# Patient Record
Sex: Female | Born: 1970 | Race: White | Marital: Married | Smoking: Former smoker
Health system: Northeastern US, Academic
[De-identification: ages and names within clinical notes are randomized; demographics above are authoritative.]

## PROBLEM LIST (undated history)

## (undated) DIAGNOSIS — F32A Depression, unspecified: Secondary | ICD-10-CM

## (undated) DIAGNOSIS — G35D Multiple sclerosis, unspecified: Secondary | ICD-10-CM

## (undated) DIAGNOSIS — G35 Multiple sclerosis: Secondary | ICD-10-CM

## (undated) HISTORY — PX: APPENDECTOMY: SHX54

---

## 2002-06-07 DIAGNOSIS — F419 Anxiety disorder, unspecified: Secondary | ICD-10-CM

## 2002-06-07 HISTORY — DX: Anxiety disorder, unspecified: F41.9

## 2006-03-15 DIAGNOSIS — G35 Multiple sclerosis: Secondary | ICD-10-CM | POA: Insufficient documentation

## 2009-04-23 ENCOUNTER — Ambulatory Visit: Payer: Self-pay | Admitting: Nurse Practitioner

## 2009-05-21 ENCOUNTER — Encounter: Payer: Self-pay | Admitting: Gastroenterology

## 2009-07-22 ENCOUNTER — Ambulatory Visit: Payer: Self-pay | Admitting: Nurse Practitioner

## 2009-07-23 NOTE — Progress Notes (Signed)
 Reason For Visit   Follow up three month appointment to discuss medical management and current   symptoms.  HPI   Patient has been about the same as last appointment and has ongoing issues   with fatigue and depression along with spasms around her chest but the   Zanaflex seems to be helping with the "MS Hug" that she deals with often at   night. She has not had any new symptoms and is tolerating her injections   with no side effects or injection site reactions.  Allergies   No Known Drug Allergy.  Current Meds   MethylPREDNISolone Sodium Succ 1000 MG Solution Reconstituted;INFUSE 1000mg    IN OF NORMAL SALINE FOR 2 DAYS; Rx  Non-medication order(s);Introcan IV Catheter 22 gage; Rx  Heparin (Porcine) in D5W 100-5 UNIT/ML-% Solution;Heparin 100 unit/ml   PFSuse as directed; Rx  Normal Saline Flush 0.9 % Solution;Normal Saline 3ml PFSUse as directed; Rx  PredniSONE 10 MG Tablet;prednisone taper 10 mg, 6tabs for 4 days, 4 tabs   for 4 days, 3 tabs for 4 days, 2 tabs for 4 days, 1 tab for 4 days,; Rx  Diazepam 5 MG Tablet;TAKE 1 TABLET HALF HOUR BEFORE MRI, TAKE 1 TABLET AT   TIME OF MRI AND 1 TABLET AFTER MRI IF NEEDED.; Rx  Zolpidem Tartrate 5 MG Tablet;TAKE 1 TABLET AT BEDTIME AS NEEDED FOR   SLEEP.; Rx  Diazepam 5 MG Tablet;TAKE 1 TABLET TWICE DAILY.; Rx  PredniSONE 10 MG Tablet;prednisone taper 10 mg, 6tabs for 4 days, 4 tabs   for 4 days, 3 tabs for 4 days, 2 tabs for 4 days, 1 tab for 4 days,; Rx  Citalopram Hydrobromide 40 MG Tablet;TAKE 1 TABLET DAILY.; Rx  Copaxone 20 MG/ML Kit;INJECT SUBCUTANEOUSLY DAILY AS DIRECTED.; Rx  Diazepam 5 MG Tablet;TAKE 1 TABLET 4 TIMES DAILY AS NEEDED.; Rx  TiZANidine HCl 4 MG Tablet;take 1 tablet 3 times a day; Rx  Baclofen 10 MG Tablet;TAKE 1 TABLET 4 TIMES DAILY; Rx  Amantadine HCl 100 MG Capsule;TAKE 1 CAPSULE TWICE DAILY.; Rx  Ciprofloxacin HCl 500 MG Tablet;TAKE 1 TABLET EVERY 12 HOURS FOR 7 DAYS.; Rx  Methylphenidate HCl 10 MG Tablet;take 1 tab twice per day.;  Rx  TiZANidine HCl 2 MG Tablet;; RPT  Vitamin D 400 UNIT Capsule;TAKE 1 CAPSULE DAILY; Rx  Zoloft 100 MG Tablet;TAKE 1 TABLET DAILY.; Rx  Zanaflex 2 MG Capsule;TAKE 1 CAPSULE TWICE DAILY; Rx  Provigil 100 MG Tablet;TAKE 1 TABLET TWICE DAILY.; Rx.  Personal Hx   Relasping remitting multiple sclerosis with ongoing issues but no new   symptoms.  ROS   A  10 point system was reviewed with no changes on his physical examination   from last appointment.  Physical Exam   On physical examination she was alert and oriented times three. There are   choppy occular pursuits with normal facial sensation and normal finger to   nose ataxia. Upper strength is 5 out of 5. Lower strength is 5 out of 5   with dorsi flexion 5 out of 5. There is slight increase in tone in both   legs and decrease in vibratory sensation in both feet. A timed 8 meter walk   took 4:99 seconds.  Assessment   Relapsing remitting mutliple sclerosis with ongoing issues of fatigue,   short term memory issues, and depression. She would like to try Ritalin 10   mg bid for fatigue and her short term memory issues. I made no other  changes to her medications. She has a follow up appointment in six months.   Thank you for allowing [place] to participate in the care of your patient.  Signature   Electronically signed by: Tish Men  N.P.; 07/23/2009 5:20 PM EST;   Chartered loss adjuster.

## 2010-01-19 ENCOUNTER — Ambulatory Visit: Payer: Self-pay | Admitting: Nurse Practitioner

## 2010-01-29 ENCOUNTER — Ambulatory Visit: Payer: Self-pay | Admitting: Nurse Practitioner

## 2010-02-06 NOTE — Progress Notes (Signed)
Reason For Visit   Follow up six month appointment to discuss medical management and current   symptoms.  HPI   Patient has been about the same as last appointment. She has stopped many   of her medications except Copaxone and feeling better. She has not had any   new symptoms and otherwise is about the same as last appointment.  Allergies   No Known Drug Allergy.  Current Meds   Non-medication order(s);Introcan IV Catheter 22 gage; Rx  Normal Saline Flush 0.9 % Solution;Normal Saline 3ml PFSUse as directed; Rx  Copaxone 20 MG/ML Kit;INJECT SUBCUTANEOUSLY DAILY AS DIRECTED.; Rx  Methylphenidate HCl 10 MG Tablet;take 1 tab twice per day.; Rx  Zoloft 100 MG Tablet;TAKE 1 TABLET DAILY.; Rx  Vitamin D 400 UNIT Capsule;TAKE 1 CAPSULE DAILY; Rx  Nitrofurantoin Monohyd Macro 100 MG Capsule;TAKE 1 CAPSULE twice perday for   7 days; Rx  Baclofen 10 MG Tablet;TAKE 1 TABLET 4 TIMES DAILY; Rx  TiZANidine HCl 4 MG Tablet;take 1 tablet 3 times a day; Rx  TiZANidine HCl 2 MG Tablet;; RPT.  Personal Hx   Relapsing remitting multiple sclerosis with ongoing symptoms but no new   issues.  ROS   A 10 point review of systems was done including those positive findings   included in the HPI. Patient reports ongoing symptoms but no new issues.  Physical Exam   On physical examination she was alert and oriented times three. There are   choppy occular pursuits with normal facial movement and normal finger to   nose ataxia. UPper strength is 5 out of 5. Lower strength is 5 out of 5   with dorsi flexion 5 outo f 5. There is increase in tone in both legs and   decrease in vibratory sensation in both feet. A timed 8 meter walk took   4:88 seconds.  Assessment   Relapsing remitting multiple sclerosis with ongoing symptoms but no new   issues. I made no changes to  her medications. She has a follow up   appointment in six months. Thank you for allowing [place] to participate in the   care of your patient.  Signature   Electronically signed by: Tish Men  N.P.; 02/06/2010 5:03 PM EST;   Chartered loss adjuster.

## 2010-06-16 NOTE — Miscellaneous (Unsigned)
 Continuity of Care Record  Created: todo  From: ,   From:   From: [employer], EHR v10.2.7.53  To: Elisha Headland  Purpose: Patient Use;       Problems  Problem: Multiple Sclerosis    Alerts  Allergy - No Known Drug Allergy     Medications  Amantadine HCl 100 MG Capsule; TAKE 1 CAPSULE TWICE DAILY. ; Rx   Baclofen 10 MG Tablet; TAKE 1 TABLET 4 TIMES DAILY ; Rx   Copaxone 20 MG/ML Kit; INJECT SUBCUTANEOUSLY DAILY AS DIRECTED. ; Rx   Methylphenidate HCl 10 MG Tablet; take 1 tab twice per day. ; Rx   Nitrofurantoin Monohyd Macro 100 MG Capsule; TAKE 1 CAPSULE twice perday   for 7 days ; Rx   Non-medication order(s); Introcan IV Catheter 22 gage ; Rx   Normal Saline Flush 0.9 % Solution; Normal Saline 3ml PFSUse as directed ;   Rx   TiZANidine HCl 4 MG Tablet; take 1 tablet 3 times a day ; Rx   Vitamin D 400 UNIT Capsule; TAKE 1 CAPSULE DAILY ; Rx   Zoloft 100 MG Tablet; TAKE 1 TABLET DAILY. ; Rx

## 2010-08-04 ENCOUNTER — Ambulatory Visit: Payer: Self-pay | Admitting: Nurse Practitioner

## 2010-08-04 ENCOUNTER — Encounter: Payer: Self-pay | Admitting: Nurse Practitioner

## 2010-08-04 NOTE — Progress Notes (Signed)
 Reason For Visit   Follow up three month appointment to discuss medical management and current   symptoms.  HPI   Patient has been about the same as last appointment and has ongoing issues   with fatigue and short term memory issues along with weakness in her lower   legs and balance issues. She has not had any new symptoms of late and seems   to be tolerating her Copaxone injections with no side effects or issues   with the medication.  Allergies   No Known Drug Allergy.  Current Meds   Copaxone 20 MG/ML Kit;INJECT SUBCUTANEOUSLY DAILY AS DIRECTED.; Rx  Baclofen 10 MG Tablet;TAKE 1 TABLET 4 TIMES DAILY; Rx  TiZANidine HCl 4 MG Tablet;take 1 tablet 3 times a day; Rx  Amantadine HCl 100 MG Capsule;TAKE 1 CAPSULE TWICE DAILY.; Rx  Zoloft 100 MG Tablet;TAKE 1 TABLET DAILY.; Rx  Nitrofurantoin Monohyd Macro 100 MG Capsule;TAKE 1 CAPSULE twice perday for   7 days; Rx  Vitamin D 400 UNIT Capsule;TAKE 1 CAPSULE DAILY; Rx  Methylphenidate HCl 10 MG Tablet;take 1 tab twice per day.; Rx  Normal Saline Flush 0.9 % Solution;Normal Saline 3ml PFSUse as directed; Rx  Non-medication order(s);Introcan IV Catheter 22 gage; Rx.  Personal Hx   Relapsing remitting multiple sclerosis with ongoing symptoms but no new   issues with her multiple sclerosis symptoms.  ROS   A  10 point review of systems was done including those positive findings   included in the HPI. Patient reports ongoing symptoms but no new issues.  Physical Exam   On physical examination she was alert and oriented times three. There are   choppy occular pursuits with normal facial movement and normal finger to   nose ataxia. Upper strength is 5 out of 5. Lower strenght is 5 out of 5   with dorsi flexion 5 out of 5. There is increase in tone in both legs and   decrease in vibratory sensation in both feet. A timed  8 meter walk took   4;99 seconds.  Assessment   Relapsing remitting multiple sclerosis with ongoing symptoms but no new   issues. I made no changes to her  medications. She should be considered   permanently disabled. She has an appointment in three months. Thank you for   allowing [place] to participate in the care of your patient.  Signature   Electronically signed by: Tish Men  N.P.; 08/04/2010 6:40 PM EST;   Chartered loss adjuster.

## 2010-12-10 ENCOUNTER — Other Ambulatory Visit: Payer: Self-pay | Admitting: Nurse Practitioner

## 2010-12-10 MED ORDER — SERTRALINE HCL 100 MG PO TABS *I*
100.0000 mg | ORAL_TABLET | Freq: Every day | ORAL | Status: DC
Start: 2010-12-10 — End: 2013-04-23

## 2011-02-02 ENCOUNTER — Ambulatory Visit: Payer: Self-pay | Admitting: Nurse Practitioner

## 2011-02-02 ENCOUNTER — Encounter: Payer: Self-pay | Admitting: Nurse Practitioner

## 2011-02-02 DIAGNOSIS — G35 Multiple sclerosis: Secondary | ICD-10-CM

## 2011-02-02 MED ORDER — BACLOFEN 10 MG PO TABS *I*
ORAL_TABLET | ORAL | Status: DC
Start: 2011-02-02 — End: 2013-04-23

## 2011-02-02 NOTE — Progress Notes (Signed)
PATIENT: Jodi Butler, Jodi Butler  MRN: [id number]   DOB: Mar 08, 1971   DATE OF SERVICE: 02/02/2011          REASON FOR VISIT  Follow up six month appointment to discuss medical management and current symptoms.  HPI  Patient has been about the same as last appointment and has not had any new symptoms or issues. She continues to have fatigue and spasticity but as long as she paces herself throughout the day she can cope. She is tolerating her Copaxone injections with no side effects or issues with the medication.  ALLERGIES  Allergies   Allergen Reactions   . No Known Drug Allergy      Created by Conversion - 0;         CURRENT MEDS:   Current Outpatient Prescriptions   Medication Sig   . baclofen (LIORESAL) 10 MG tablet Take one tablet four times per day   . sertraline (ZOLOFT) 100 MG tablet Take 1 tablet (100 mg total) by mouth daily     . amantadine (SYMMETREL) 100 MG capsule TAKE 1 CAPSULE TWICE DAILY.   . nitrofurantoin, macrocrystal-monohydrate, (MACROBID) 100 MG capsule TAKE 1 CAPSULE twice perday for 7 days   . methylphenidate (RITALIN) 10 MG tablet take 1 tab twice per day.   Marland Kitchen tiZANidine (ZANAFLEX) 4 MG tablet take 1 tablet 3 times a day   . ZOLOFT 100 MG tablet TAKE 1 TABLET DAILY.   Marland Kitchen Cholecalciferol (VITAMIN D) 400 UNITS capsule TAKE 1 CAPSULE DAILY   . sodium chloride 0.9% flush (NORMAL SALINE FLUSH) 0.9 % injection Normal Saline 3ml PFSUse as directed   . glatiramer (COPAXONE) 20 MG/ML injection INJECT SUBCUTANEOUSLY DAILY AS DIRECTED.        PERSONAL Hx:  Relapsing remitting multiple sclerosis with ongoing symptoms with fatigue and spasticity  but no new issues of late.    ROS:  A 10 point review of systems was done including those positive findings included in the HPI. Patient reports ongoing symptoms but no new issues.    VITAL SIGNS:  Wt Readings from Last 3 Encounters:   01/29/10 82.6 kg (182 lb 1.6 oz)     Temp Readings from Last 3 Encounters:   06/21/08 35.5 C (95.9 F)    06/20/08 36.1 C (96.98 F)     06/19/08 35.6 C (96.08 F)      BP Readings from Last 3 Encounters:   01/29/10 120/71   06/21/08 123/77   06/20/08 130/65     Pulse Readings from Last 3 Encounters:   01/29/10 61   06/21/08 81   06/20/08 96      There were no vitals filed for this visit.     PHYSICAL EXAM:  On physical examination she was alert and oriented times three There are choppy occular pursuits with mild facial weakness and normal finger to nose ataxia. Upper strength is 5 out of 5. Lower strength is 4 out of 5 wiith dorsi flexion 4 out of 5. There is increase in tone in both legs and decrease in vibratory sensation in both feet. A itmed 8 meter walk took 5:02 seconds with her gait slightly ataxic and wide based.    ASSESSMENT:  Relapsing remittting multiple sclerosis with ongoing symptoms but no new issues. I made no changes to her medications. She has a follow up appointment in six months. Thank you for allowing [place] to participate in the care of your patient.

## 2011-05-20 ENCOUNTER — Other Ambulatory Visit: Payer: Self-pay | Admitting: Nurse Practitioner

## 2011-05-20 MED ORDER — BUPROPION HCL 150 MG PO TB12 *I*
150.0000 mg | ORAL_TABLET | Freq: Two times a day (BID) | ORAL | Status: DC
Start: 2011-05-20 — End: 2013-04-23

## 2011-05-20 MED ORDER — BUPROPION HCL 150 MG PO TB24 *I*
150.0000 mg | ORAL_TABLET | Freq: Every morning | ORAL | Status: DC
Start: 2011-05-20 — End: 2013-04-23

## 2013-04-22 ENCOUNTER — Encounter: Payer: Self-pay | Admitting: Primary Care

## 2013-04-23 ENCOUNTER — Encounter: Payer: Self-pay | Admitting: Primary Care

## 2013-04-23 ENCOUNTER — Ambulatory Visit: Payer: Self-pay | Admitting: Primary Care

## 2013-04-23 VITALS — BP 110/78 | HR 84 | Temp 99.1°F | Ht 60.5 in | Wt 170.4 lb

## 2013-04-23 DIAGNOSIS — Z0000 Encounter for general adult medical examination without abnormal findings: Secondary | ICD-10-CM

## 2013-04-23 DIAGNOSIS — H6123 Impacted cerumen, bilateral: Secondary | ICD-10-CM

## 2013-04-23 DIAGNOSIS — F32A Depression, unspecified: Secondary | ICD-10-CM

## 2013-04-23 DIAGNOSIS — J069 Acute upper respiratory infection, unspecified: Secondary | ICD-10-CM

## 2013-04-23 DIAGNOSIS — G35 Multiple sclerosis: Secondary | ICD-10-CM | POA: Insufficient documentation

## 2013-04-23 MED ORDER — BUPROPION HCL 150 MG PO TB12 *I*
150.0000 mg | ORAL_TABLET | Freq: Two times a day (BID) | ORAL | Status: DC
Start: 2013-04-23 — End: 2013-09-21

## 2013-04-23 NOTE — Progress Notes (Signed)
[facility] - Outpatient Progress Note    SUBJECTIVE    Jodi Butler is a 42 y.o. female here to establish care. she is transferring from [place]. she has the following concerns today, including:    1. L ear pain - Pain has been coming and going for months, usually only minutes in duration, accompanied by occasional drainage. Denies difficulty hearing, fevers/chills, jaw pain. Previous h/o cerumen impaction requiring debrox treatment. Uses qtips.     2. Viral sx - About 1 week ago developed rhinorrhea, congestion, ST. Now feels like it's progressed into her lungs. Has productive cough with green sputum. Denies acute SOB or chest tightness. Denies focal sinus pain or purulent rhinorrhea. Denies fevers.     3. H/o multiple sclerosis - Diagnosed in 2004 with initial sx of double vision, then fatigue and muscle spasticity. Previous pt at [facility] (Dr. Derrill Kay), currently on Copaxone injections. Has flare of sx about every 1-2 years, last time was ~1 year ago. Has not seen Neuro since coming back to [place]. States she's at her baseline. Completely disabled as a result, but independent in her ADLs and iADLs. Drives and walks without ambulatory device at baseline, but uses wheelchair with flares.     4. H/o depression - Stable with Wellbutrin for the past 2 years. Needs refill. Currently having intermittent anxiety due to health problems of her wife, but otherwise mood is "ok". Has never participated in counseling before.       Review of Systems   Constitutional:  --negative for fever and chills   Eyes:  -- negative for blurred or double vision   ENMT:   --positive for acute rhinorrhea and congestion  --negative for hearing loss or tinnitus, positive for L ear pain  --negative for mouth lesions/sores or tooth pain  --positive for ST   Respiratory:   --positive for current productive cough, denies chronicity  --positive for exertional dyspnea, noticed this during the move  --negative for  wheezing  Cardiovascular:  --negative for chest pain, palpitations, syncope  --negative for pedal edema  Gastrointestinal:  --negative for abdominal pain, n/v/c/d  Genitourinary:  --positive for heavy menses and cramping with periods, regular in timing. Needs pap smear  Heme/Lymph:  --negative for swollen lymph nodes   --positive for easy bruising and bleeding  Skin/Breast:  --negative for rashes or concerning mole  --negative for breast concerns, has never had mammogram yet  Psych:  --negative for current depressed mood  --positive for intermittent worry,   --negative for substance abuse, SI  Endo:  --positive for nocturia  --negative for polydispsia  --negative for heat/cold intolerance   Neuro:  --Denies recent falls  --positive for intermittent numbness in b/l hands, denies specific radicular distribution  MSK:  --Intermittent b/l LE discomfort, otherwise no myalgias or arthralgias    Patient's medications, allergies, past medical, surgical, social and/or family histories were reviewed with the patient and updated/populated in eRecord.     Current Outpatient Prescriptions   Medication Sig   . Vitamin Mixture (ESTER-C PO) Take 500 mg by mouth daily   . cetirizine (ZYRTEC) 10 MG tablet Take 10 mg by mouth daily   . ibuprofen (ADVIL,MOTRIN) 200 MG tablet Take 400 mg by mouth 2 times daily as needed for Pain   . glatiramer (COPAXONE) 20 MG/ML injection Inject 20 mg into the skin   . tiZANidine (ZANAFLEX) 4 MG tablet Take 4 mg by mouth every 8 hours as needed for Muscle spasms   . baclofen (LIORESAL) 10  MG tablet 10 mg nightly as needed   . buPROPion (WELLBUTRIN SR) 150 MG 12 hr tablet Take 1 tablet (150 mg total) by mouth 2 times daily   Swallow whole. Do not crush, break, or chew.     Health maintenance:   Last pap smear - 2000?  Has never had mammogram  Thinks she's UTD on Tdap, has not rec'd flu this year or PCV      OBJECTIVE    Filed Vitals:    04/23/13 1026   BP: 110/78   Pulse: 84   Temp: 37.3 C (99.1 F)    TempSrc: Temporal   Height: 1.537 m (5' 0.5")   Weight: 77.293 kg (170 lb 6.4 oz)      Body mass index is 32.72 kg/(m^2).      General: well-appearing Caucasian female, pleasant & conversant, in NAD  Psych:  normal mood and affect; answers questions appropriately; AAOx3 although her wife occasionally provides family history which pt then remembers and tells me that she forgot due to her MS  Eyes:  Normal lids and conjuctivae, EOMI, PERRLA. No nystagmus, INO or lack of consensual light reflex.  HEENT: B/L TMs obscured by soft cerumen (see proc note below). No focal sinus tenderness on palpation. Nasal turbinates normal appearing, without obstruction or purulent mucus collection. MMM without OP lesions.   Neck: supple, no thyromegaly, no palpable thyroid nodules  Lymph: no cervical or supraclavicular LAD  Lungs: normal respiratory effort, lungs are clear to auscultation bilaterally with no rales, wheezes, or rhonchi  CV: RRR, no murmur, rub or gallop, no pedal edema  Skin: Warm, dry, normal turgor; no rashes visible.   Ext: Trace pedal edema bilaterally.   Neuro: CNs grossly intact. Motor and sensory grossly intact. DTRs 2+ bilaterally. No increased tone. Normal gait.    PRE-PROCEDURE EXAM: TM cannot be visualized due to total occlusion/impaction of the ear canal.  PROCEDURE INDICATION: remove wax to visualize ear drum &  relieve discomfort  CONSENT: Verbal    PROCEDURE NOTE:   Left EAR: I used a plastic illuminated curette under direct vision with an otoscope and attempted to free the wax bolus from the ear wall, then successfully removed the majority of wax. TM was successfully visualized and without abnormality  Right Ear: I used a plastic illuminated curette under direct vision with an otoscope and attempted to free the wax bolus from the ear wall, but was only able to remove a small amount of wax. The external canal suffered a local abrasion with minimal amount of bleeding and subsequent discomfort to the  patient. The ear was then irrigated per pt's request with minimal yield. A curette was again used in attempt to remove the remaining wax but was again uncomfortable for the patient. The right TM was ultimately not visualized.      POST- PROCEDURE EXAM: LTM successfully visualized and found to be intact with no redness or perforation. 98% of ear wax had been removed from the ear canal. R TM remained obscured by impacted cerumen and external canal developed abrasion with minimal amount of bleeding    POST-PROCEDURE INSTRUCTIONS: avoid use of Q-tips        ASSESSMENT & PLAN  1. Cerumen impaction, bilateral  L cerumen successfully removed. R cerumen remained impacted. Encouraged to avoid q tip use and f/u prn if R ear becomes uncomfortable/painful.    2. Viral URI  Encouraged supportive care with fluids, APAP/NSAIDs and honey for cough. Reviewed concerns for pneumonia and  sinusitis and recommended f/u prn development of these sx.    3. Multiple sclerosis  Encouraged to establish care with neurology. Vaccine provided as below.    - Pneumococcal polysaccharide vaccine 23-valent greater than or equal to 2yo subcutaneous/IM (AMBULATORY USE ONLY)    4. Depression  Refilled Wellbutrin. Provided supportive listening about recent stressors that pt and her wife have undergone. Encouraged f/u if worsening of sx.     5. Healthcare maintenance  - CBC; Future  - Comprehensive metabolic panel; Future  - LIPID PANEL; Future  - Flu vaccine quadrivalent greater than or equal to 3yo with preservative IM  Pt denies current fever (but has current viral UR sx), recent vaccine in last 4 weeks, allergy to egg or flu vaccine component, h/o Guillain-Barre or current pregnancy. I have educated the patient and/or parent/guardian/designee about each component in all the immunizations and toxoids that are being given today, have reviewed potential vaccine side effects and have answered their questions about the vaccinations.     Encouraged f/u for  AGY given lack of Pap smear in ~10 yrs, and need to discuss breast cancer screening given +fam hx.      Follow up next avail for AGY, return to clinic sooner prn.        Farrell Ours, MD  [facility]  04/23/2013   10:33 AM

## 2013-05-07 ENCOUNTER — Ambulatory Visit
Admit: 2013-05-07 | Discharge: 2013-05-07 | Disposition: A | Discharge status: Home / Self Care | Payer: Self-pay | Source: Ambulatory Visit | Attending: Primary Care | Admitting: Primary Care

## 2013-05-07 DIAGNOSIS — Z0000 Encounter for general adult medical examination without abnormal findings: Secondary | ICD-10-CM

## 2013-05-07 LAB — LIPID PANEL
Chol/HDL Ratio: 2.5
Cholesterol: 177 mg/dL
HDL: 70 mg/dL
LDL Calculated: 98 mg/dL
Non HDL Cholesterol: 107 mg/dL
Triglycerides: 47 mg/dL

## 2013-05-07 LAB — COMPREHENSIVE METABOLIC PANEL
ALT: 18 U/L (ref 0–35)
AST: 20 U/L (ref 0–35)
Albumin: 4.3 g/dL (ref 3.5–5.2)
Alk Phos: 57 U/L (ref 35–105)
Anion Gap: 12 (ref 7–16)
Bilirubin,Total: 0.4 mg/dL (ref 0.0–1.2)
CO2: 25 mmol/L (ref 20–28)
Calcium: 8.9 mg/dL (ref 8.8–10.2)
Chloride: 104 mmol/L (ref 96–108)
Creatinine: 0.72 mg/dL (ref 0.51–0.95)
GFR,Black: 120 *
GFR,Caucasian: 104 *
Glucose: 85 mg/dL (ref 60–99)
Lab: 10 mg/dL (ref 6–20)
Potassium: 4.2 mmol/L (ref 3.3–5.1)
Sodium: 141 mmol/L (ref 133–145)
Total Protein: 6.5 g/dL (ref 6.3–7.7)

## 2013-05-07 LAB — CBC
Hematocrit: 39 % (ref 34–45)
Hemoglobin: 12.9 g/dL (ref 11.2–15.7)
MCV: 96 fL — ABNORMAL HIGH (ref 79–95)
Platelets: 221 10*3/uL (ref 160–370)
RBC: 4.1 MIL/uL (ref 3.9–5.2)
RDW: 12.8 % (ref 11.7–14.4)
WBC: 3.7 10*3/uL — ABNORMAL LOW (ref 4.0–10.0)

## 2013-05-14 ENCOUNTER — Ambulatory Visit: Payer: Self-pay | Admitting: Primary Care

## 2013-05-14 NOTE — Telephone Encounter (Signed)
Unable to reach patient by telephone.  Call could not be completed per carrier.  My Chart message sent advising patient of cancellation.

## 2013-05-17 ENCOUNTER — Other Ambulatory Visit: Payer: Self-pay | Admitting: Nurse Practitioner

## 2013-05-17 MED ORDER — BACLOFEN 10 MG PO TABS *I*
10.0000 mg | ORAL_TABLET | Freq: Every evening | ORAL | Status: DC | PRN
Start: 2013-05-17 — End: 2013-07-11

## 2013-05-17 MED ORDER — TIZANIDINE HCL 4 MG PO TABS *I*
4.0000 mg | ORAL_TABLET | Freq: Three times a day (TID) | ORAL | Status: DC | PRN
Start: 2013-05-17 — End: 2013-06-18

## 2013-05-18 NOTE — Telephone Encounter (Signed)
I attempted to call both numbers on file, home/cell is not working and her work number said they do not recall anyone by that name

## 2013-06-12 ENCOUNTER — Telehealth: Payer: Self-pay

## 2013-06-12 NOTE — Telephone Encounter (Signed)
Patient's appointment with Dr. Christell Constant was bumped on 05/14/13 and patient never called to reschedule.  Left message with "[name]" to have patient call the office.  Please reschedule AGYN when she calls back.

## 2013-06-13 NOTE — Telephone Encounter (Signed)
LVM (see below)

## 2013-06-14 NOTE — Telephone Encounter (Signed)
Unable to LVM.

## 2013-06-14 NOTE — Telephone Encounter (Signed)
Patient is scheduled for 07/10/13. She wanted an appt later in the month

## 2013-06-18 ENCOUNTER — Ambulatory Visit: Payer: Self-pay | Admitting: Nurse Practitioner

## 2013-06-18 DIAGNOSIS — G35 Multiple sclerosis: Secondary | ICD-10-CM

## 2013-06-18 MED ORDER — TIZANIDINE HCL 4 MG PO TABS *I*
4.0000 mg | ORAL_TABLET | Freq: Three times a day (TID) | ORAL | Status: DC | PRN
Start: 2013-06-18 — End: 2016-06-02

## 2013-06-18 MED ORDER — ALPRAZOLAM 0.25 MG PO TABS *I*
ORAL_TABLET | ORAL | Status: DC
Start: 2013-06-18 — End: 2014-11-12

## 2013-06-18 MED ORDER — CIPROFLOXACIN HCL 500 MG PO TABS *I*
ORAL_TABLET | ORAL | Status: DC
Start: 2013-06-18 — End: 2014-05-21

## 2013-06-20 NOTE — Progress Notes (Signed)
PATIENT NAME:  Jodi Butler   MRN: [id number]   DOB: Feb 11, 1971   DATE OF SERVICE: 06/18/2013          REASON FOR VISIT  Follow up three month appointment to discuss medical management and current symptoms.  HPI  Patient has been about the same as last appointment and has ongoing issues with depression and anxiety. She feels that some of her current issues are related to moving back to cold weather from [place] and not seeing sun every day. She feels more cooped in during the day. She is trying to exercise as much as possible and that helps some of her depression. She has not had any new symptoms and feels otherwise about the same. She finds that Xanax has been very helpful in keeping her anxiety in check and tries to limit how much she takes during the day as she afraid if she takes too much she will develop a tolerance to it.   ALLERGIES  Allergies   Allergen Reactions    No Known Drug Allergy      Created by Conversion - 0;         CURRENT MEDS:   Current Outpatient Prescriptions   Medication Sig    ciprofloxacin (CIPRO) 500 MG tablet Take one tablet twice per day for 7 days.    tiZANidine (ZANAFLEX) 4 MG tablet Take 1 tablet (4 mg total) by mouth every 8 hours as needed for Muscle spasms    ALPRAZolam (XANAX) 0.25 MG tablet Take one tablet twice per day    baclofen (LIORESAL) 10 MG tablet Take 1 tablet (10 mg total) by mouth nightly as needed    Vitamin Mixture (ESTER-C PO) Take 500 mg by mouth daily    cetirizine (ZYRTEC) 10 MG tablet Take 10 mg by mouth daily    ibuprofen (ADVIL,MOTRIN) 200 MG tablet Take 400 mg by mouth 2 times daily as needed for Pain    glatiramer (COPAXONE) 20 MG/ML injection Inject 20 mg into the skin    buPROPion (WELLBUTRIN SR) 150 MG 12 hr tablet Take 1 tablet (150 mg total) by mouth 2 times daily   Swallow whole. Do not crush, break, or chew.        PERSONAL Hx:  Relapsing remitting multiple sclerosis with ongoing symptoms of depression and anxiety but no new symptoms of late.  She is tolerating her medications with no side effects or issues.     ROS:  A 10 point review of systems was done including those positive findings included in the HPI. Patient reports the above mentioned symptoms     VITAL SIGNS:  Wt Readings from Last 3 Encounters:   04/23/13 77.293 kg (170 lb 6.4 oz)   01/29/10 82.6 kg (182 lb 1.6 oz)     Temp Readings from Last 3 Encounters:   04/23/13 37.3 C (99.1 F) Temporal   06/21/08 35.5 C (95.9 F)    06/20/08 36.1 C (96.98 F)      BP Readings from Last 3 Encounters:   04/23/13 110/78   01/29/10 120/71   06/21/08 123/77     Pulse Readings from Last 3 Encounters:   04/23/13 84   01/29/10 61   06/21/08 81      There were no vitals filed for this visit.     PHYSICAL EXAM:  On physical examination she was alert and oriented times three. There a choppy occular pursuits with normal facial sensation and no nose to finger ataxia. Upper strength  is (R/L) 5/5.Lower strength is (R/L) 5/5 with dorsi flexion 5/5 There is normal tone in both legs and decrease in vibratory sensation in both feet. A timed 8 meter walk took 4:88 seconds.     ASSESSMENT:  Relapsing remitting multiple sclerosis with ongoing symptoms of anxiety and depression but no new symptoms of late. She feels she is trying to cope as best as she can and does not want any changes to her medications or a referral to see a counselor.  I made no other changes to her medications. She has a follow up appointment in six months. Thank you for allowing us to participate in the care of your patient.

## 2013-07-10 ENCOUNTER — Ambulatory Visit: Payer: Self-pay | Admitting: Primary Care

## 2013-07-11 ENCOUNTER — Other Ambulatory Visit: Payer: Self-pay | Admitting: Nurse Practitioner

## 2013-08-06 LAB — HM HIV SCREENING OFFERED

## 2013-08-06 LAB — HM PAP SMEAR

## 2013-08-09 ENCOUNTER — Encounter: Payer: Self-pay | Admitting: Primary Care

## 2013-08-09 ENCOUNTER — Ambulatory Visit: Payer: Self-pay | Admitting: Primary Care

## 2013-08-09 VITALS — BP 120/80 | HR 98 | Temp 98.2°F | Ht 61.0 in | Wt 166.0 lb

## 2013-08-09 DIAGNOSIS — Z0000 Encounter for general adult medical examination without abnormal findings: Secondary | ICD-10-CM

## 2013-08-09 MED ORDER — NYSTOP 100000 UNIT/GM EX POWD *I*
Freq: Two times a day (BID) | CUTANEOUS | Status: DC | PRN
Start: 2013-08-09 — End: 2018-10-10

## 2013-08-09 NOTE — Patient Instructions (Signed)
Mammo  [facility]  [phone]

## 2013-08-09 NOTE — H&P (Signed)
[facility]: Annual Gyn Exam    HPI  Jodi Butler is a 43 y.o. premenopausal female who presents for an annual gyn  exam.      OB/Gyn History  LMP: Patient's last menstrual period was 08/02/2013 (approximate).   Menses: regular, heavy bleeding the first 2 days, 4-5 days total   Cramps: moderate in first few days    Last pap smear: Date: ?15 yrs ago  Results: normal    The patient is  sexually active.   Sexually active:female  Together with partner: years  STD History: denies  Current contraception: Female partner      Obstetric History     No data available   G0P0    Patient Active Problem List   Diagnosis Code    Multiple sclerosis 340    Depression 311      No past medical history on file.   Past Surgical History   Procedure Laterality Date    Appendectomy       Current Outpatient Prescriptions   Medication    baclofen (LIORESAL) 10 MG tablet    ciprofloxacin (CIPRO) 500 MG tablet    tiZANidine (ZANAFLEX) 4 MG tablet    ALPRAZolam (XANAX) 0.25 MG tablet    Vitamin Mixture (ESTER-C PO)    cetirizine (ZYRTEC) 10 MG tablet    ibuprofen (ADVIL,MOTRIN) 200 MG tablet    glatiramer (COPAXONE) 20 MG/ML injection    buPROPion (WELLBUTRIN SR) 150 MG 12 hr tablet     No current facility-administered medications for this visit.     Allergies   Allergen Reactions    No Known Drug Allergy      Created by Conversion - 0;      Family History   Problem Relation Age of Onset    Multiple Sclerosis Father     Stroke Maternal Grandmother     Alzheimer's disease Maternal Grandmother     Hypertension Mother     Breast cancer Paternal Aunt 1    Breast cancer Paternal Grandmother     Lung cancer Paternal Uncle      History     Social History    Marital Status: Married     Spouse Name: N/A     Number of Children: N/A    Years of Education: N/A     Occupational History    Not on file.     Social History Main Topics    Smoking status: Former Smoker -- 1.00 packs/day for 5 years     Types: Cigarettes      Quit date: 06/07/1996    Smokeless tobacco: Not on file    Alcohol Use: Yes    Drug Use: No    Sexual Activity:     Partners: Female     Other Topics Concern    Not on file     Social History Narrative    Lives with wife Jodi Butler and 2 dogs. Permanently disabled for multiple sclerosis.      Family History   Problem Relation Age of Onset    Multiple Sclerosis Father     Stroke Maternal Grandmother     Alzheimer's disease Maternal Grandmother     Hypertension Mother     Breast cancer Paternal Aunt 8    Breast cancer Paternal Grandmother     Lung cancer Paternal Uncle    Denies osteoporosis      Safety  Wears seatbelt? yes  Wears cycling helmet? yes  Smoke detectors?yes  Carbon monoxide  detectors?yes  Adequate housing? yes  Feel safe at home? yes      Health Maintenance  Looking into insurance coverage for Tdap and PCV  Has not had mammo yet    ROS  CONSTITUTIONAL: Appetite good, no fevers, night sweats or weight loss   CV: No chest pain, palpitations, shortness of breath or peripheral edema   RESPIRATORY: No cough, wheezing or dyspnea   BREAST: No nipple discharge or breast tenderness  GU: No dysuria, urgency or incontinence  GYN: No vaginal discharge, odor, +dryness and itching and discomfort. No rash.   NEURO: MS typically presents with double vision and weakness of legs, no use of wheelchair or assistive device.        PHYSICAL EXAM  BP 120/80    Pulse 98    Temp(Src) 36.8 C (98.2 F) (Temporal)    Ht 1.549 m (5\' 1" )    Wt 75.297 kg (166 lb)    BMI 31.38 kg/m2      LMP 08/02/2013     Body mass index is 31.38 kg/(m^2).   GENERAL APPEARANCE: Appears stated age, well appearing, NAD   GU: normal external female genitalia with mild erythema of inguinal folds and perineum, somewhat papular on gluteus. No purple changes however.  Vaginal discharge was not present.  Normal nulliparous cervix, mild bleeding after specimen collected. No masses in vaginal vault.  No cervical motion tenderness, no adnexal masses.      ASSESSMENT  Jodi Butler is a 43 y.o. female here for annual gyn exam.        1. Annual physical exam  GYN Cytology    Mammography screening bilateral    GYN Cytology        PLAN    Immunizations:   Immunization History   Administered Date(s) Administered    Influenza Injectable Quadrivalent with preservati 04/23/2013   Recommended PCV and Tdap; pt going to look into her insurance    Weight management: Body mass index is 31.38 kg/(m^2).Marland Kitchen.    Discussed the patient's BMI with her. The BMI is above average; BMI management plan is completed.      Anticipatory/preventative guidance  Tobacco use and cessation counseling if indicated: Y  Alcohol use: Y  Drug use: Y  OBGYN/Pelvic (females): Y  Mammogram/breast self exam (females):  Y  DEXA (females):  Y, no chronic steroids or decreased ambulation warranting early DXA  Exercise:  Y  Psychologist, sport and exercisemoke detectors, Government social research officerfire extinguisher, CO detector:  Y  Seatbelt use: Y  Marital/relationship history: Y  Diet/body weight discussed: Y   Depression screening evaluation performed today: Y  Cardiac risk factor modification discussed Y  Diabetes prevention and screening discussed Y        Follow-up: annually or prn    Signed:  Farrell OursJillian Maddison Kilner, MD  [facility]

## 2013-08-14 LAB — HPV DNA PROBE WITH CYTOLOGY
HPV Other High Risk: NEGATIVE
HPV Type 16: NEGATIVE
HPV Type 18: NEGATIVE

## 2013-08-14 LAB — GYN CYTOLOGY

## 2013-08-21 ENCOUNTER — Telehealth: Payer: Self-pay

## 2013-08-21 NOTE — Telephone Encounter (Signed)
LVM asking patient to call us back, we need to find out if she has scheduled her Mammogram yet if not it needs to get scheduled

## 2013-08-22 NOTE — Telephone Encounter (Signed)
LVM (see below)

## 2013-08-23 NOTE — Telephone Encounter (Signed)
Spoke with patient and she has had a family member in the hospital once this is scheduled she will call us and let us know

## 2013-09-03 ENCOUNTER — Telehealth: Payer: Self-pay

## 2013-09-03 ENCOUNTER — Other Ambulatory Visit: Payer: Self-pay | Admitting: Primary Care

## 2013-09-03 DIAGNOSIS — Z1231 Encounter for screening mammogram for malignant neoplasm of breast: Secondary | ICD-10-CM

## 2013-09-03 NOTE — Telephone Encounter (Signed)
LVM    Please see if patient scheduled her Mammogram yet and if not please offer to schedule it for her

## 2013-09-03 NOTE — Telephone Encounter (Signed)
When patient calls back please let her know her Mammogram has been scheduled for May 28th at 11:45 at [facility]

## 2013-09-05 NOTE — Telephone Encounter (Signed)
LVM giving patient the information about her mamo

## 2013-09-21 ENCOUNTER — Ambulatory Visit: Payer: Self-pay | Admitting: Nurse Practitioner

## 2013-09-21 DIAGNOSIS — G35 Multiple sclerosis: Secondary | ICD-10-CM

## 2013-09-21 MED ORDER — BUPROPION HCL 300 MG PO TB24 *I*
300.0000 mg | ORAL_TABLET | Freq: Every morning | ORAL | Status: DC
Start: 2013-09-21 — End: 2014-04-08

## 2013-09-21 MED ORDER — BACLOFEN 10 MG PO TABS *I*
10.0000 mg | ORAL_TABLET | Freq: Two times a day (BID) | ORAL | Status: DC
Start: 2013-09-21 — End: 2013-11-05

## 2013-09-23 NOTE — Progress Notes (Signed)
PATIENT NAME:  Jodi Butler   MRN: [id number]   DOB: 11-06-1970   DATE OF SERVICE: 09/21/2013          REASON FOR VISIT  Follow up six month appointment to discuss medical management and current symptoms.   HPI  Patient states that she has been about the same as last appointment and has ongoing issues with anxiety and depression but no new symptoms of late. She is tolerating her medications with no side effects or issues.   ALLERGIES  Allergies   Allergen Reactions    No Known Drug Allergy      Created by Conversion - 0;         CURRENT MEDS:   Current Outpatient Prescriptions   Medication Sig    buPROPion (WELLBUTRIN XL) 300 MG 24 hr tablet Take 1 tablet (300 mg total) by mouth every morning   Swallow whole. Do not crush, break, or chew.    baclofen (LIORESAL) 10 MG tablet Take 1 tablet (10 mg total) by mouth 2 times daily    nystatin (NYSTOP) 100000 UNIT/GM powder Apply topically 2 times daily as needed for Itching    ciprofloxacin (CIPRO) 500 MG tablet Take one tablet twice per day for 7 days.    tiZANidine (ZANAFLEX) 4 MG tablet Take 1 tablet (4 mg total) by mouth every 8 hours as needed for Muscle spasms    ALPRAZolam (XANAX) 0.25 MG tablet Take one tablet twice per day    Vitamin Mixture (ESTER-C PO) Take 500 mg by mouth daily    cetirizine (ZYRTEC) 10 MG tablet Take 10 mg by mouth daily    ibuprofen (ADVIL,MOTRIN) 200 MG tablet Take 400 mg by mouth 2 times daily as needed for Pain    glatiramer (COPAXONE) 20 MG/ML injection Inject 20 mg into the skin        PERSONAL Hx:  Relapsing remitting multiple sclerosis with ongoing issues of anxiety and depression but no new symptoms of late. She is tolerating her current symptoms.     ROS:  A 10 point review of systems was done including those positive findings included in the HPI. Patient reports the above mentioned symptoms.     VITAL SIGNS:  Wt Readings from Last 3 Encounters:   08/09/13 75.297 kg (166 lb)   04/23/13 77.293 kg (170 lb 6.4 oz)   01/29/10 82.6  kg (182 lb 1.6 oz)     Temp Readings from Last 3 Encounters:   08/09/13 36.8 C (98.2 F) Temporal   04/23/13 37.3 C (99.1 F) Temporal   06/21/08 35.5 C (95.9 F)      BP Readings from Last 3 Encounters:   08/09/13 120/80   04/23/13 110/78   01/29/10 120/71     Pulse Readings from Last 3 Encounters:   08/09/13 98   04/23/13 84   01/29/10 61      There were no vitals filed for this visit.     PHYSICAL EXAM:  On physical examination she was alert and oriented times three. There are choppy occular pursuits with normal facial sensation and no finger to nose ataxia. Upper strength is (R/L) 5/5. Lower strength is (R/L) 5/5 with dorsi flexion 5/5 There is normal tone in both legs and decrease in vibratory sensation in both legs A timed 8 meter walk took 4.99 seconds.     ASSESSMENT:  Relapsing remitting multiple sclerosis with ongoing symptoms of depression and anxiety but no new symptoms of late. I made no changes  to her medications. She should be considered permanently disabled. She has a follow up appointment in six months. Thank you for allowing us to participate in the care of your patient.

## 2013-10-04 ENCOUNTER — Telehealth: Payer: Self-pay | Admitting: Primary Care

## 2013-10-04 DIAGNOSIS — G35 Multiple sclerosis: Secondary | ICD-10-CM

## 2013-10-04 NOTE — Telephone Encounter (Signed)
Pt needs new neuro referral. Will be seeing Dr Marshell Garfinkel, [facility].

## 2013-11-01 ENCOUNTER — Ambulatory Visit
Admit: 2013-11-01 | Discharge: 2013-11-01 | Disposition: A | Discharge status: Home / Self Care | Payer: Self-pay | Source: Ambulatory Visit | Attending: Primary Care | Admitting: Primary Care

## 2013-11-01 LAB — HM MAMMOGRAPHY

## 2013-11-03 ENCOUNTER — Other Ambulatory Visit: Payer: Self-pay | Admitting: Nurse Practitioner

## 2013-11-05 ENCOUNTER — Other Ambulatory Visit: Payer: Self-pay | Admitting: Neurology

## 2013-11-05 MED ORDER — BACLOFEN 10 MG PO TABS *I*
10.0000 mg | ORAL_TABLET | Freq: Two times a day (BID) | ORAL | Status: DC
Start: 2013-11-05 — End: 2014-05-21

## 2013-11-07 ENCOUNTER — Other Ambulatory Visit: Payer: Self-pay | Admitting: Radiology

## 2013-11-07 DIAGNOSIS — R928 Other abnormal and inconclusive findings on diagnostic imaging of breast: Secondary | ICD-10-CM

## 2013-11-16 ENCOUNTER — Ambulatory Visit
Admit: 2013-11-16 | Discharge: 2013-11-16 | Disposition: A | Discharge status: Home / Self Care | Payer: Self-pay | Source: Ambulatory Visit | Attending: Radiology | Admitting: Radiology

## 2013-11-22 ENCOUNTER — Encounter: Payer: Self-pay | Admitting: Primary Care

## 2013-11-30 ENCOUNTER — Other Ambulatory Visit: Payer: Self-pay | Admitting: Primary Care

## 2013-11-30 DIAGNOSIS — R739 Hyperglycemia, unspecified: Secondary | ICD-10-CM

## 2013-12-10 ENCOUNTER — Ambulatory Visit
Admit: 2013-12-10 | Discharge: 2013-12-10 | Disposition: A | Discharge status: Home / Self Care | Payer: Self-pay | Source: Ambulatory Visit | Attending: Primary Care | Admitting: Primary Care

## 2013-12-10 DIAGNOSIS — R739 Hyperglycemia, unspecified: Secondary | ICD-10-CM

## 2013-12-11 LAB — HEMOGLOBIN A1C: Hemoglobin A1C: 5.3 % (ref 4.0–6.0)

## 2014-04-08 ENCOUNTER — Other Ambulatory Visit: Payer: Self-pay | Admitting: Nurse Practitioner

## 2014-04-08 NOTE — Telephone Encounter (Signed)
Spoke with pt by phone who is overdue for FUV.  Pt would like to call herself for appt.    She is aware she will need follow up to continue prescription refill.s

## 2014-05-21 ENCOUNTER — Encounter: Payer: Self-pay | Admitting: Primary Care

## 2014-05-21 ENCOUNTER — Ambulatory Visit: Payer: Self-pay | Admitting: Primary Care

## 2014-05-21 VITALS — BP 116/78 | HR 60 | Temp 97.3°F | Ht 61.02 in | Wt 166.6 lb

## 2014-05-21 DIAGNOSIS — B351 Tinea unguium: Secondary | ICD-10-CM

## 2014-05-21 DIAGNOSIS — H6123 Impacted cerumen, bilateral: Secondary | ICD-10-CM

## 2014-05-21 DIAGNOSIS — Z0000 Encounter for general adult medical examination without abnormal findings: Secondary | ICD-10-CM

## 2014-05-21 DIAGNOSIS — L601 Onycholysis: Secondary | ICD-10-CM

## 2014-05-21 LAB — COMPREHENSIVE METABOLIC PANEL
ALT: 23 U/L (ref 0–35)
AST: 25 U/L (ref 0–35)
Albumin: 4.6 g/dL (ref 3.5–5.2)
Alk Phos: 49 U/L (ref 35–105)
Anion Gap: 14 (ref 7–16)
Bilirubin,Total: 0.5 mg/dL (ref 0.0–1.2)
CO2: 23 mmol/L (ref 20–28)
Calcium: 8.9 mg/dL (ref 8.8–10.2)
Chloride: 104 mmol/L (ref 96–108)
Creatinine: 0.63 mg/dL (ref 0.51–0.95)
GFR,Black: 128 *
GFR,Caucasian: 111 *
Glucose: 79 mg/dL (ref 60–99)
Lab: 7 mg/dL (ref 6–20)
Potassium: 4.2 mmol/L (ref 3.3–5.1)
Sodium: 141 mmol/L (ref 133–145)
Total Protein: 6.6 g/dL (ref 6.3–7.7)

## 2014-05-21 LAB — LIPID PANEL
Chol/HDL Ratio: 2.4
Cholesterol: 189 mg/dL
HDL: 79 mg/dL
LDL Calculated: 97 mg/dL
Non HDL Cholesterol: 110 mg/dL
Triglycerides: 65 mg/dL

## 2014-05-21 MED ORDER — TERBINAFINE HCL 250 MG PO TABS *I*
250.0000 mg | ORAL_TABLET | Freq: Every day | ORAL | Status: DC
Start: 2014-05-21 — End: 2014-08-16

## 2014-05-21 NOTE — Addendum Note (Signed)
Addended by: Kennith Center on: 05/21/2014 01:06 PM     Modules accepted: Orders

## 2014-05-21 NOTE — Progress Notes (Signed)
[facility]    SUBJECTIVE    Pt is here to discuss:    Chief Complaint   Patient presents with    Lab Results     follow up    Other     tdap         1. Nail problems - left hand and left foot heavy regular nail appearance compared to the other side.  On her hands, she has whitening of the distal aspects of some of the fingernails, along with some lifting up.  These areas will catch on things, and therefore she tries to trim them short period she also has some discoloration of her toenails on the left foot.  She has chronic skin dryness and excessive calluses of the feet, which she tells me runs in her family.    2. cerumen impaction - L>R ear pain and muffled hearing.  Is avoiding deep Q-tips.  Still having excessive cerumen    PMH / Family Hx / Social Hx  Patient's medications, allergies, problem list, past medical, social histories were reviewed and notable for:    H/o mult sclerosis, recently stopped baclofen and only uses tizanidine occasionally due to sedating SE    Current Outpatient Prescriptions   Medication Sig    melatonin 3 MG Take 3 mg by mouth nightly as needed for Sleep    gabapentin (NEURONTIN) 100MG  capsule Take 100 mg by mouth 2 times daily    buPROPion (WELLBUTRIN XL) 300 MG 24 hr tablet TAKE 1 TABLET BY MOUTH EVERY MORNING    nystatin (NYSTOP) 100000 UNIT/GM powder Apply topically 2 times daily as needed for Itching    tiZANidine (ZANAFLEX) 4 MG tablet Take 1 tablet (4 mg total) by mouth every 8 hours as needed for Muscle spasms    ALPRAZolam (XANAX) 0.25 MG tablet Take one tablet twice per day    Vitamin Mixture (ESTER-C PO) Take 500 mg by mouth daily    cetirizine (ZYRTEC) 10 MG tablet Take 10 mg by mouth daily    ibuprofen (ADVIL,MOTRIN) 200 MG tablet Take 400 mg by mouth 2 times daily as needed for Pain    glatiramer (COPAXONE) 20 MG/ML injection Inject 20 mg into the skin       ROS  +chest tightness in a band distribution around the middle of her chest, described to  her by her neuro as "MS hug", usually constant, worsened with certain foods, relieved with tizanidine, denies specific exertional component    +redness and dryness in her groin area    Patient denies excessive moisture or water exposure of her hands, systemic rash consistent with psoriasis    OBJECTIVE  Filed Vitals:    05/21/14 1001   BP: 116/78   Pulse: 60   Temp: 36.3 C (97.3 F)   TempSrc: Temporal   Height: 1.55 m (5' 1.02")   Weight: 75.569 kg (166 lb 9.6 oz)     Body mass index is 31.45 kg/(m^2).      General: well-appearing Caucasian female, pleasant & conversant, in NAD  ENT: B/L obscured by soft cerumen, removed easily as below, and subsequently visualized to be grey, intact, nonbulging.  Skin: Dry cracked dyshidrotic appearing skin on lateral aspects of fingers and palms of bilateral hands.  Dry flaking skin behind bilateral ears.  Left hand has white discoloration and lifting from the nailbed on her third through fifth digits.  Her second digit has some horizontal lines, and a heaped up the regularity in the middle  of the nail, with thinning and peeling of the aspect distal to this.  No linear or vertical lines or defects of the nail, no abnormalities of the nail folds, no indentation of the nailbed or subungual collections or pigmentation.  Left foot has opaque discoloration of the first, fourth, fifth nails, with hyperkeratotic debris underneath these.  She also has excessive callus that is yellow discoloration along the bilateral soles of her feet and heels.    PRE-PROCEDURE EXAM: TM cannot be visualized due to total occlusion/impaction of the ear canal.  PROCEDURE INDICATION: remove wax to visualize ear drum & improve hearing & relieve discomfort  CONSENT: Verbal    PROCEDURE NOTE:   bilateral EAR: I used a plastic illuminated curette under direct vision with an otoscope and freed the wax bolus from the ear wall, then successfully removed a small bit of wax. This was repeated and wax was removed via  piecemeal until pt had relief or TM was able to be visualized.  There was mild bleeding of the right external canal related to trauma from the curette.      POST- PROCEDURE EXAM: TM successfully visualized and found to be intact with no redness or perforation. 98% of ear wax had been removed from the ear canal.    POST-PROCEDURE INSTRUCTIONS: avoid use of Q-tips        ASSESSMENT & PLAN  1. Onychomycosis  2. Onycholysis  Nail clipping from her right ring finger was collected to culture and evaluate for coexisting fungal infection, which can be associated with onycholysis. No current evidence of psoriasis, but does describe and show other areas where she may have fungal colonization (Seborrhea behind ears, intertrigo, toenail fungus). Will treat empirically for finger and toe nail fungal infection.  Reviewed the importance of checking LFTs prior to treatment.  Medication interactions reviewed, and terbinafine does not have any with her current medications.  Followup in 3 months to review the fact.  Encourage daily moisturization of her hands and feet, avoidance of over drying, heavily fragmented, alcohol rubs.  - CMP; future  - Fungus culture (Nail); Future    3. Cerumen impaction   Removed as described above.  Encouraged to continue avoiding deep Q-tips.  Can continue to followup on an as-needed basis    4. Healthcare maintenance  Pt denies current illness/fever, recent vaccine in last 4 weeks, allergy to egg or flu vaccine component, h/o Guillain-Barre or current pregnancy. I have educated the patient and/or parent/guardian/designee about each component in all the immunizations and toxoids that are being given today, have reviewed potential vaccine side effects and have answered their questions about the vaccinations.   - LIPID PANEL; Future  - Flu vaccine quadrivalent greater than or equal to 3yo with preservative IM  - Tdap >/= 5663yr(Boostrix)  - LIPID PANEL      Follow-up: 3 months regarding nails      Farrell OursJillian  Delmus Warwick, MD  [facility]  05/21/2014  12:38 PM        ______________________

## 2014-06-03 LAB — FUNGUS CULTURE

## 2014-08-12 ENCOUNTER — Ambulatory Visit: Payer: Self-pay | Admitting: Primary Care

## 2014-08-13 ENCOUNTER — Ambulatory Visit: Payer: Self-pay | Admitting: Primary Care

## 2014-08-13 ENCOUNTER — Encounter: Payer: Self-pay | Admitting: Primary Care

## 2014-08-13 VITALS — BP 126/78 | HR 72 | Temp 98.3°F | Ht 61.02 in | Wt 159.6 lb

## 2014-08-13 DIAGNOSIS — H6120 Impacted cerumen, unspecified ear: Secondary | ICD-10-CM

## 2014-08-13 DIAGNOSIS — B351 Tinea unguium: Secondary | ICD-10-CM

## 2014-08-13 DIAGNOSIS — F32A Depression, unspecified: Secondary | ICD-10-CM

## 2014-08-13 LAB — COMPREHENSIVE METABOLIC PANEL
ALT: 21 U/L (ref 0–35)
AST: 13 U/L (ref 0–35)
Albumin: 4.7 g/dL (ref 3.5–5.2)
Alk Phos: 56 U/L (ref 35–105)
Anion Gap: 13 (ref 7–16)
Bilirubin,Total: 0.5 mg/dL (ref 0.0–1.2)
CO2: 28 mmol/L (ref 20–28)
Calcium: 9.5 mg/dL (ref 8.8–10.2)
Chloride: 100 mmol/L (ref 96–108)
Creatinine: 0.74 mg/dL (ref 0.51–0.95)
GFR,Black: 115 *
GFR,Caucasian: 100 *
Glucose: 80 mg/dL (ref 60–99)
Lab: 8 mg/dL (ref 6–20)
Potassium: 4.2 mmol/L (ref 3.3–5.1)
Sodium: 141 mmol/L (ref 133–145)
Total Protein: 7 g/dL (ref 6.3–7.7)

## 2014-08-13 LAB — TSH: TSH: 1.42 u[IU]/mL (ref 0.27–4.20)

## 2014-08-13 NOTE — Progress Notes (Signed)
[facility]    SUBJECTIVE    Pt is here to discuss:    Chief Complaint   Patient presents with    Depression    Other     due for tdap     1. F/u onychomycosis - Terbinafine is helping with fingernail changes, but has persistent nail changes of the toenails.  Has not had CMP repeated, requesting additional three-month course of terbinafine    2. F/u depression -unclear if she is having disabling symptoms or not.  She usually lasts it off, appears uncomfortable in front of her wife was present at her visit today.  States she has her ups and downs days, wondering if she could increase the Wellbutrin anymore.  Continues to do things that she enjoys including home improvement projects.  Is not involved in any personal or couples counseling.  Wife has had recent severe battle with psychogenic syncope, and requires a lot of her attention and daily care.  Marabella's mult sclerosis is well controlled, no recent flare.     3. Cerumen impaction - Recurrent issue, requesting removal today due to ear pain      PMH / Family Hx / Social Hx  Patient's medications, allergies, problem list, past medical, social histories were reviewed and notable for:    Current Outpatient Prescriptions   Medication Sig    terbinafine (LAMISIL) 250 MG tablet Take 1 tablet (250 mg total) by mouth daily    buPROPion (WELLBUTRIN XL) 300 MG 24 hr tablet TAKE 1 TABLET BY MOUTH EVERY MORNING    nystatin (NYSTOP) 100000 UNIT/GM powder Apply topically 2 times daily as needed for Itching    tiZANidine (ZANAFLEX) 4 MG tablet Take 1 tablet (4 mg total) by mouth every 8 hours as needed for Muscle spasms    ALPRAZolam (XANAX) 0.25 MG tablet Take one tablet twice per day    Vitamin Mixture (ESTER-C PO) Take 500 mg by mouth daily    cetirizine (ZYRTEC) 10 MG tablet Take 10 mg by mouth daily    ibuprofen (ADVIL,MOTRIN) 200 MG tablet Take 400 mg by mouth 2 times daily as needed for Pain    glatiramer (COPAXONE) 20 MG/ML injection Inject 20 mg  into the skin     OBJECTIVE  Filed Vitals:    08/13/14 1421   BP: 126/78   Pulse: 72   Temp: 36.8 C (98.3 F)   TempSrc: Temporal   Height: 1.55 m (5' 1.02")   Weight: 72.394 kg (159 lb 9.6 oz)     Body mass index is 30.13 kg/(m^2).      General: well-appearing Caucasian female, pleasant & conversant, in NAD  Psych: mild psychomotor agitation and fidgeting. [employer], consoles wife who is present today with neck brace on.  Eyes:. PERRLA. EOMI. Conjunctiva pink, without swelling or exudate. Lids normal.   ENT: Moist mucous membranes. BL TMs obscured by soft cerumen (removed easily as below)  Skin: improvement in onycholysis of fingernails, still some white discoloration of distal aspects but less lifting off from nailbed.    PRE-PROCEDURE EXAM: TM cannot be visualized due to total occlusion/impaction of the ear canal.  PROCEDURE INDICATION: remove wax to visualize ear drum & improve hearing & relieve discomfort  CONSENT: Verbal    PROCEDURE NOTE:   bilateral EAR: I used a plastic illuminated curette under direct vision with an otoscope and freed the wax bolus from the ear wall, then successfully removed a small bit of wax. This was repeated and wax  was removed via piecemeal until pt had relief or TM was able to be visualized.       POST- PROCEDURE EXAM: TM successfully visualized and found to be intact with no redness or perforation. 98% of ear wax had been removed from the ear canal.    POST-PROCEDURE INSTRUCTIONS: avoid use of Q-tips        ASSESSMENT & PLAN  1. Onychomycosis  Renewed terbinafine, requested that she update CMP as below  - Comprehensive metabolic panel; Future  - Comprehensive metabolic panel    2. Depression  Discussed role of being caretaker of chronically ill spouse.  Recommended she continue self care, monitor for anhedonia, and consider couples counseling for maintenance.  We discussed with [name] (pt's wife) today that she could be helpful in monitoring for any acute worsening in Jodi Butler's  affect or behaviors.  She is currently on a high dose of Wellbutrin, therefore we did not change this as of yet given the lack of significant disabling depression symptoms at this time.   - TSH; Future  - TSH    3. Cerumen impaction, unspecified laterality  F/u q74mo for maintenance        Follow-up: 3 mo for cerumen exam and onychomycosis      Farrell Ours, MD  [facility]  08/13/2014  2:34 PM        ______________________

## 2014-08-16 ENCOUNTER — Other Ambulatory Visit: Payer: Self-pay | Admitting: Primary Care

## 2014-08-27 ENCOUNTER — Encounter: Payer: Self-pay | Admitting: Gastroenterology

## 2014-10-25 ENCOUNTER — Ambulatory Visit: Payer: Self-pay | Admitting: Optometry

## 2014-10-25 ENCOUNTER — Encounter: Payer: Self-pay | Admitting: Optometry

## 2014-10-25 DIAGNOSIS — H52203 Unspecified astigmatism, bilateral: Secondary | ICD-10-CM

## 2014-10-25 DIAGNOSIS — H524 Presbyopia: Secondary | ICD-10-CM

## 2014-10-25 DIAGNOSIS — G35 Multiple sclerosis: Secondary | ICD-10-CM

## 2014-10-28 DIAGNOSIS — H52203 Unspecified astigmatism, bilateral: Secondary | ICD-10-CM | POA: Insufficient documentation

## 2014-10-28 DIAGNOSIS — H524 Presbyopia: Secondary | ICD-10-CM | POA: Insufficient documentation

## 2014-10-28 NOTE — Progress Notes (Signed)
Outpatient Visit      Patient name: Jodi Butler  DOB: Dec 22, 1970       Age: 44 y.o.  MR#: [id number]    Encounter Date: 10/25/2014    Subjective:      Chief Complaint   Patient presents with    Blurred Vision     HPI     NPV- Routine Eye Exam.  H/o multiple sclerosis, has h/o episode of diplopia years ago, lasted for   a few mos then resolved.  Pt reported LEE was over 5 years ago. Blurry vision when reading. Denies   any pain. Denies any pain.         has a current medication list which includes the following prescription(s): terbinafine, bupropion, tizanidine, cetirizine, ibuprofen, glatiramer, nystatin, alprazolam, and vitamin mixture.     is allergic to no known drug allergy.      No past medical history on file.   Past Surgical History   Procedure Laterality Date    Appendectomy          Specialty Problems        Ophthalmology Problems    Astigmatism with presbyopia, bilateral               ROS     Positive for: Eyes    Negative for: Constitutional, Gastrointestinal, Neurological, Skin,   Genitourinary, Musculoskeletal, HENT, Endocrine, Cardiovascular,   Respiratory, Psychiatric, Allergic/Imm, Heme/Lymph         Objective:     Base Eye Exam     Visual Acuity (Snellen - Linear)      Right Left   Dist cc 20/20 20/20   Near cc J7 J10       Correction:  Glasses      Tonometry (Tonopen, 11:05 AM)      Right Left   Pressure 21 19         Pupils      Pupils APD   Right PERRLA None   Left PERRLA None         Visual Fields      Left Right   Result Full Full         Extraocular Movement      Right Left   Result Full, Ortho Full, Ortho         Neuro/Psych     Oriented x3:  Yes    Mood/Affect:  Normal      Dilation     Both eyes:  2.5% Phenylephrine, 1.0% Tropicamide @ 11:05 AM            Slit Lamp and Fundus Exam     External Exam      Right Left    External Normal ocular adnexae, lacrimal gland & drainage, orbits Normal ocular adnexae, lacrimal gland & drainage, orbits      Slit Lamp Exam      Right Left     Lids/Lashes Normal structure & position Normal structure & position    Conjunctiva/Sclera Normal bulbar/palpebral, conjunctiva, sclera Normal bulbar/palpebral, conjunctiva, sclera    Cornea Normal epithelium, stroma, endothelium, tear film Normal epithelium, stroma, endothelium, tear film    Anterior Chamber Clear & deep Clear & deep    Iris Normal shape, size, morphology Normal shape, size, morphology    Lens Normal cortex, nucleus, anterior/posterior capsule, clarity Normal cortex, nucleus, anterior/posterior capsule, clarity    Vitreous Clear Clear      Fundus Exam      Right Left  Disc Normal size, appearance, nerve fiber layer Normal size, appearance, nerve fiber layer    C/D Ratio 0.25 0.25    Macula Normal Normal    Vessels Normal Normal    Periphery Normal Normal            Refraction     Wearing Rx      Sphere Cylinder Axis   Right +0.25 Sphere    Left +0.25 -0.50 008       Age:  5+yrs    Type:  SVL      Manifest Refraction      Sphere Cylinder Axis Dist Add Near   Right [place] Sphere  20/20 +1.50 J1+ OU   Left +0.25 -0.50 008  +1.50          Final Rx      Sphere Cylinder Axis Add   Right [place] Sphere  +1.50   Left +0.25 -0.50 008 +1.50       Type:  PAL    Expiration Date:  10/26/2015              Final Rx      Sphere Cylinder Axis Add   Right [place] Sphere  +1.50   Left +0.25 -0.50 008 +1.50       Type:  PAL    Expiration Date:  10/26/2015                No annotated images are attached to the encounter.      Assessment/Plan:      1. Multiple sclerosis     2. Astigmatism with presbyopia, bilateral           PLAN:  1.  No ocular sequelae at this time.  Monitor with annual dilated eye exams or sooner PRN if visual changes noted.  2.  New spectacle Rx given today for full time wear.  Monitor yearly or sooner PRN if visual problems noted.

## 2014-11-12 ENCOUNTER — Encounter: Payer: Self-pay | Admitting: Primary Care

## 2014-11-12 ENCOUNTER — Ambulatory Visit: Payer: Self-pay | Admitting: Primary Care

## 2014-11-12 VITALS — BP 136/88 | HR 78 | Temp 98.5°F | Ht 61.0 in | Wt 158.0 lb

## 2014-11-12 DIAGNOSIS — B351 Tinea unguium: Secondary | ICD-10-CM

## 2014-11-12 DIAGNOSIS — F32A Depression, unspecified: Secondary | ICD-10-CM

## 2014-11-12 DIAGNOSIS — H6123 Impacted cerumen, bilateral: Secondary | ICD-10-CM

## 2014-11-12 MED ORDER — CARBAMIDE PEROXIDE 6.5 % OT SOLN *I*
OTIC | 1 refills | Status: DC
Start: 2014-11-12 — End: 2018-06-15

## 2014-11-12 MED ORDER — DULOXETINE HCL 30 MG PO CPEP *I*
30.0000 mg | DELAYED_RELEASE_CAPSULE | Freq: Every day | ORAL | 5 refills | Status: DC
Start: 2014-11-12 — End: 2015-06-07

## 2014-11-12 NOTE — Patient Instructions (Signed)
[facility]  [phone]  Please contact their office to schedule an appointment

## 2014-11-12 NOTE — Progress Notes (Signed)
[facility]    SUBJECTIVE    Pt is here to discuss:    Chief Complaint   Patient presents with    Follow-up     f/u 50months mood and fungus     other     mammo     1. Onychomycosis - resolved with this 2nd round of terbinafine! Hands have cleared completely, toenails are growing back healthier. Finished treatment at the end of this month.    2. Cerumen impaction - +ear wax, no pain or muffled hearing, here for routine maintenance before it gets to that point. Not using qtips    3. Mood - Still suffering some lows, feeling overwhelmed, like she snaps at her partner. Intensifies around her menstrual cycle. Denies verbal or physical abuse from or towards her partner. Partner suffers from recurrent syncope, disabled as a result. Stressful to their QOL. Pt suffers from mult sclerosis and has fatigue at end of day that limits her. Increased responsibilities in home and with caring for her wife worsen the fatigue and sometimes are limited by the fatigue. Wondering if she can change meds for her mood and energy. Used nuvigil x1 mo but then cost prevented her to continue. Didn't notice an improvement. Has tried zoloft, paxil, prozac in past without relief.      PMH / Family Hx / Social Hx  Patient's medications, allergies, problem list, past medical, social histories were reviewed and notable for:    Lives with wife and dogs, both are disabled and home     Current Outpatient Prescriptions   Medication Sig    buPROPion (WELLBUTRIN XL) 300 MG 24 hr tablet TAKE 1 TABLET BY MOUTH EVERY MORNING    nystatin (NYSTOP) 100000 UNIT/GM powder Apply topically 2 times daily as needed for Itching    tiZANidine (ZANAFLEX) 4 MG tablet Take 1 tablet (4 mg total) by mouth every 8 hours as needed for Muscle spasms    cetirizine (ZYRTEC) 10 MG tablet Take 10 mg by mouth daily    ibuprofen (ADVIL,MOTRIN) 200 MG tablet Take 400 mg by mouth 2 times daily as needed for Pain    glatiramer (COPAXONE) 20 MG/ML injection Inject 20  mg into the skin    carbamide peroxide (DEBROX) 6.5 % otic solution Place in ear four times daily 24h prior to appts for removal    Vitamin Mixture (ESTER-C PO) Take 500 mg by mouth daily       ROS  Denies SI/SA or self harm      OBJECTIVE  Vitals:    11/12/14 1400   BP: 136/88   Pulse: 78   Temp: 36.9 C (98.5 F)   TempSrc: Temporal   SpO2: 99%   Weight: 71.7 kg (158 lb)   Height: 1.549 m (5\' 1" )     Body mass index is 29.85 kg/(m^2).      General: well-appearing Caucasian female, pleasant & conversant, in NAD  Eyes:. PERRLA. EOMI. Conjunctiva pink, without swelling or exudate. Lids normal.   ENT: Moist mucous membranes. Normal lips and dentition. B/L TMs grey, without bulging or erythema. External canals with mild amount of dry cerumen, partially obscuring TM.  Skin:fingernails completely healthy.   Psych: AAOx3, normal affect and mood. Insight and judgement intact.     CERUMEN REMOVAL / MANUAL WITH CERUMEN CURETTE --- PROCEDURE NOTE    PRE-PROCEDURE EXAM: H/o recurrent cerumen impaction, now on q8mo removal to prevent total occlusion/impaction of the ear canal.    PROCEDURE INDICATION: remove  wax to visualize ear drum, prevent total occlusion/impaction     CONSENT:  Verbal    PROCEDURE NOTE:     RIGHT EAR:  I used an ear wax curette (The Lighted Ear Currette with Magnification) under direct vision with an otoscope to free the wax bolus from the ear wall and then successfully removed the impacted wax.       LEFT EAR: I used an ear wax curette (The Lighted Ear Currette with Magnification) under direct vision with an otoscope to free the wax bolus from the ear wall and then successfully removed the impacted wax.   POST- PROCEDURE EXAM: B/L TM successfully visualized and found to be intact with no redness or perforation. 75% of ear wax had been removed from the ear canal.    POST-PROCEDURE INSTRUCTIONS: avoid use of Q-tips, use debrox prior to next appt to avoid pain that she exhibited today.    CPT Code: 32440          PHQ: 5    ASSESSMENT & PLAN  1. Onychomycosis  Improving with terbinafine. Recommended she throw out old shoes that are dark/moist/likely contain fungus. Reviewed high likelihood of recurrence.     2. Cerumen impaction, bilateral  Continue q42mo assessment and removal.    3. Depression  Exacerbated by spouse's and her own disabilities. Reviewed the depressing features of chronic illness. Will refer for couples counseling. Pt not interested in individual therapy at this time. Also advised trial of low dose SNRI to see if this improves sx that she has been c/o for the past 3mos. F/u 2 mos.  - AMB REFERRAL TO PSYCH - ADULT CLINIC        Follow-up: 2 mo via mychart, 3 mo in office re: mood      Farrell Ours, MD  [facility]  11/12/2014  2:14 PM        ______________________

## 2014-11-15 ENCOUNTER — Telehealth: Payer: Self-pay

## 2014-11-15 NOTE — Telephone Encounter (Signed)
Writer called patient per referral and patient was unsure about getting services at this time. Patient took the number for FTS and will call back if interested in services.

## 2014-11-25 ENCOUNTER — Encounter: Payer: Self-pay | Admitting: Gastroenterology

## 2014-11-25 ENCOUNTER — Ambulatory Visit
Admit: 2014-11-25 | Discharge: 2014-11-25 | Disposition: A | Discharge status: Home / Self Care | Payer: Self-pay | Source: Ambulatory Visit | Attending: Nurse Practitioner | Admitting: Nurse Practitioner

## 2014-11-26 LAB — AEROBIC CULTURE: Aerobic Culture: 0

## 2014-12-21 ENCOUNTER — Encounter: Payer: Self-pay | Admitting: Radiology

## 2015-02-12 ENCOUNTER — Encounter: Payer: Self-pay | Admitting: Primary Care

## 2015-02-12 ENCOUNTER — Ambulatory Visit: Payer: Self-pay | Admitting: Primary Care

## 2015-02-12 VITALS — BP 106/78 | HR 88 | Temp 98.2°F | Ht 61.0 in | Wt 162.2 lb

## 2015-02-12 DIAGNOSIS — F32A Depression, unspecified: Secondary | ICD-10-CM

## 2015-02-12 DIAGNOSIS — H6121 Impacted cerumen, right ear: Secondary | ICD-10-CM

## 2015-02-12 NOTE — Progress Notes (Signed)
[facility]    SUBJECTIVE    Pt is here to discuss:    Chief Complaint   Patient presents with    Mood     due for: tdap and mammo    Cerumen Impaction       1. F/u depression - feeling much better with cymbalta added in June. Notices improvement in relationship with wife and their partnership:  Jodi Butler describes that her wife participates in cooking, recently threw her a birthday party.  Rosilyn admits that she still has her down days, but overall feels much more positive.    2. F/u cerumen impaction- here for repeat assessment and removal. Has not been using debrox.  Last done 11/2014    PMH / Family Hx / Social Hx  Patient's medications, allergies, problem list, past medical, social histories were reviewed and notable for:    Current Outpatient Prescriptions   Medication Sig    carbamide peroxide (DEBROX) 6.5 % otic solution Place in ear four times daily 24h prior to appts for removal    DULoxetine (CYMBALTA) 30 MG DR capsule Take 1 capsule (30 mg total) by mouth daily    buPROPion (WELLBUTRIN XL) 300 MG 24 hr tablet TAKE 1 TABLET BY MOUTH EVERY MORNING    nystatin (NYSTOP) 100000 UNIT/GM powder Apply topically 2 times daily as needed for Itching    tiZANidine (ZANAFLEX) 4 MG tablet Take 1 tablet (4 mg total) by mouth every 8 hours as needed for Muscle spasms    cetirizine (ZYRTEC) 10 MG tablet Take 10 mg by mouth daily    ibuprofen (ADVIL,MOTRIN) 200 MG tablet Take 400 mg by mouth 2 times daily as needed for Pain    glatiramer (COPAXONE) 20 MG/ML injection Inject 40 mg into the skin          ROS  Recalls brief headache and stomach upset when she started Cymbalta, no lasting side effects    OBJECTIVE  Vitals:    02/12/15 1334   BP: 106/78   Pulse: 88   Temp: 36.8 C (98.2 F)   Weight: 73.6 kg (162 lb 3.2 oz)   Height: 1.549 m (5\' 1" )     Body mass index is 30.65 kg/(m^2).      General: well-appearing Caucasian female, pleasant & conversant, in NAD  Eyes:. PERRLA. EOMI. Conjunctiva pink,  without swelling or exudate. Lids normal.   ENT: Moist mucous membranes. Normal lips and dentition. L TM normal and external canal clear. R external canal with moderate amount of cerumen.   Psych: AAOx3, normal affect and mood. Insight and judgement intact.     CERUMEN REMOVAL / MANUAL WITH CERUMEN CURETTE --- PROCEDURE NOTE    PRE-PROCEDURE EXAM: R TM cannot be visualized due to total occlusion/impaction of the ear canal.    PROCEDURE INDICATION: remove wax to visualize ear drum,  improve hearing & relieve discomfort    CONSENT:  Verbal    PROCEDURE NOTE:     RIGHT EAR:  I used an ear wax curette (The Lighted Ear Currette with Magnification) under direct vision with an otoscope to free the wax bolus from the ear wall and then successfully removed the impacted wax.     POST- PROCEDURE EXAM: R TM successfully visualized and found to be intact with no redness or perforation.90% of ear wax had been removed from the ear canal.    POST-PROCEDURE INSTRUCTIONS: avoid use of Q-tips.    CPT Code: 97948  ASSESSMENT & PLAN  1. Depression, unspecified depression type  Improving with Cymbalta.  Continue current medication regimen.  Follow-up 6 months.    2. Impacted cerumen of right ear  Less cerumen removed today compared to June.  Follow-up 6 months.      Follow-up: 55mo re: mood and cerumen      Farrell Ours, MD  [facility]  02/12/2015  2:20 PM        ______________________

## 2015-03-28 ENCOUNTER — Encounter: Payer: Self-pay | Admitting: Gastroenterology

## 2015-05-07 ENCOUNTER — Other Ambulatory Visit: Payer: Self-pay | Admitting: Oncology

## 2015-05-28 ENCOUNTER — Encounter: Payer: Self-pay | Admitting: Gastroenterology

## 2015-06-05 ENCOUNTER — Other Ambulatory Visit: Payer: Self-pay

## 2015-06-05 NOTE — Telephone Encounter (Signed)
Jodi Butler with [facility] is requesting refills for DULoxetine (CYMBALTA) 30 MG DR capsule to be sent to their pharmacy.    Jodi Butler can be reached at [phone] if needed.

## 2015-06-05 NOTE — Telephone Encounter (Signed)
Refill denied due to patient needing an appointment, she has not been seen since 09/21/13 FUV with Johnny Bridge Smith-Lightfoot NP.     Called patient to schedule FUV with Dr. Jerline Pain, she is declining the appointment and stated the refill is no longer needed.

## 2015-06-07 ENCOUNTER — Other Ambulatory Visit: Payer: Self-pay | Admitting: Primary Care

## 2015-06-07 ENCOUNTER — Other Ambulatory Visit: Payer: Self-pay | Admitting: Oncology

## 2015-06-10 MED ORDER — DULOXETINE HCL 30 MG PO CPEP *I*
30.0000 mg | DELAYED_RELEASE_CAPSULE | Freq: Every day | ORAL | 5 refills | Status: DC
Start: 2015-06-10 — End: 2015-12-08

## 2015-06-12 ENCOUNTER — Ambulatory Visit: Payer: Self-pay

## 2015-06-12 ENCOUNTER — Ambulatory Visit
Admission: RE | Admit: 2015-06-12 | Discharge: 2015-06-12 | Disposition: A | Discharge status: Home / Self Care | Payer: Self-pay | Source: Ambulatory Visit

## 2015-06-18 LAB — TB AG T-CELL STIMULATION: TB Ag T-Cell Stimulation: 0

## 2015-06-18 NOTE — Progress Notes (Signed)
Flu vaccine given.

## 2015-08-11 ENCOUNTER — Ambulatory Visit: Payer: Self-pay | Admitting: Primary Care

## 2015-12-03 ENCOUNTER — Other Ambulatory Visit: Payer: Self-pay | Admitting: Family Medicine

## 2015-12-03 ENCOUNTER — Other Ambulatory Visit: Payer: Self-pay | Admitting: Oncology

## 2015-12-07 ENCOUNTER — Encounter: Payer: Self-pay | Admitting: Primary Care

## 2015-12-07 ENCOUNTER — Other Ambulatory Visit: Payer: Self-pay | Admitting: Family Medicine

## 2015-12-07 DIAGNOSIS — F32A Depression, unspecified: Secondary | ICD-10-CM

## 2015-12-08 ENCOUNTER — Other Ambulatory Visit: Payer: Self-pay

## 2015-12-08 DIAGNOSIS — F32A Depression, unspecified: Secondary | ICD-10-CM

## 2015-12-08 MED ORDER — BUPROPION HCL 300 MG PO TB24 *I*
300.0000 mg | ORAL_TABLET | Freq: Every morning | ORAL | 3 refills | Status: DC
Start: 2015-12-08 — End: 2016-03-05

## 2015-12-08 MED ORDER — DULOXETINE HCL 30 MG PO CPEP *I*
30.0000 mg | DELAYED_RELEASE_CAPSULE | Freq: Every day | ORAL | 5 refills | Status: DC
Start: 2015-12-08 — End: 2016-02-07

## 2015-12-08 NOTE — Telephone Encounter (Signed)
Buproperion was sent to wrong pharmacy please resend

## 2016-02-07 ENCOUNTER — Other Ambulatory Visit: Payer: Self-pay | Admitting: Primary Care

## 2016-02-10 ENCOUNTER — Other Ambulatory Visit: Payer: Self-pay | Admitting: Primary Care

## 2016-02-10 ENCOUNTER — Other Ambulatory Visit
Admission: RE | Admit: 2016-02-10 | Discharge: 2016-02-10 | Disposition: A | Discharge status: Home / Self Care | Payer: Self-pay | Source: Ambulatory Visit | Attending: Primary Care | Admitting: Primary Care

## 2016-02-10 DIAGNOSIS — R32 Unspecified urinary incontinence: Secondary | ICD-10-CM

## 2016-02-10 LAB — URINALYSIS WITH MICROSCOPIC
Blood,UA: NEGATIVE
Leuk Esterase,UA: NEGATIVE
Nitrite,UA: NEGATIVE
Protein,UA: 30 mg/dL — AB
RBC,UA: 1 /hpf (ref 0–2)
Specific Gravity,UA: 1.03 (ref 1.002–1.030)
WBC,UA: 1 /hpf (ref 0–5)
pH,UA: 5 (ref 5.0–8.0)

## 2016-02-10 MED ORDER — DULOXETINE HCL 30 MG PO CPEP *I*
30.0000 mg | DELAYED_RELEASE_CAPSULE | Freq: Every day | ORAL | 1 refills | Status: DC
Start: 2016-02-10 — End: 2016-10-08

## 2016-02-10 NOTE — Progress Notes (Signed)
Spoke with spouse at her appt today  Pt with new incontinence, wife worried about Mult Sclerosis progression  Advised f/u with Neuro, and UA testing to r/o infection    Orders written and pt will submit sample at lab    Farrell Ours, MD  [facility]  02/10/2016  2:33 PM

## 2016-02-11 LAB — AEROBIC CULTURE: Aerobic Culture: 0

## 2016-02-19 ENCOUNTER — Telehealth: Payer: Self-pay

## 2016-02-19 NOTE — Telephone Encounter (Signed)
Pt dropped off [employer] Statement of Continued Disability to be completed by provider, no Release of Information was obtained as pt wished to pick up this documentation. Forms have been prefilled out and placed into Dr. Kathi Der office for completion, when forms are completed please place a copy into a scanning and the original into the pt pick up folder and contact pt at [phone] as this was verified as the best number to reach her.

## 2016-02-20 ENCOUNTER — Encounter: Payer: Self-pay | Admitting: Primary Care

## 2016-02-23 NOTE — Telephone Encounter (Signed)
I received the paperwork back from Dr. Christell Constant. The patient needs to complete the first page. I tried calling the patient but her phone isn't working. I have sent the patient a mychart message. A copy of the form was placed into scanning. The original is in the patient pick up folder.

## 2016-02-23 NOTE — Telephone Encounter (Signed)
error 

## 2016-02-25 NOTE — Telephone Encounter (Signed)
Patient returned call and stated that she will like for paperwork to be mailed to her. Forms placed in mail.

## 2016-03-05 ENCOUNTER — Other Ambulatory Visit: Payer: Self-pay | Admitting: Primary Care

## 2016-03-05 DIAGNOSIS — F32A Depression, unspecified: Secondary | ICD-10-CM

## 2016-03-08 MED ORDER — BUPROPION HCL 300 MG PO TB24 *I*
300.0000 mg | ORAL_TABLET | Freq: Every morning | ORAL | 3 refills | Status: DC
Start: 2016-03-08 — End: 2017-03-06

## 2016-05-21 ENCOUNTER — Encounter: Payer: Self-pay | Admitting: Primary Care

## 2016-06-01 ENCOUNTER — Other Ambulatory Visit: Payer: Self-pay | Admitting: Primary Care

## 2016-06-02 MED ORDER — TIZANIDINE HCL 4 MG PO TABS *I*
4.0000 mg | ORAL_TABLET | Freq: Three times a day (TID) | ORAL | 5 refills | Status: DC | PRN
Start: 2016-06-02 — End: 2018-04-26

## 2016-06-03 ENCOUNTER — Other Ambulatory Visit: Payer: Self-pay | Admitting: Primary Care

## 2016-06-14 ENCOUNTER — Ambulatory Visit: Payer: Medicare (Managed Care) | Attending: Primary Care

## 2016-06-14 DIAGNOSIS — Z23 Encounter for immunization: Secondary | ICD-10-CM

## 2016-07-29 ENCOUNTER — Encounter: Payer: Self-pay | Admitting: Gastroenterology

## 2016-08-02 ENCOUNTER — Other Ambulatory Visit: Payer: Self-pay | Admitting: Gastroenterology

## 2016-08-03 ENCOUNTER — Encounter: Payer: Self-pay | Admitting: Gastroenterology

## 2016-08-03 ENCOUNTER — Other Ambulatory Visit: Payer: Self-pay

## 2016-08-03 LAB — BASIC METABOLIC PANEL
Anion Gap: 5 mEq/L — ABNORMAL LOW (ref 7–16)
CO2: 30 mEq/L (ref 20–31)
Calcium: 8.9 mg/dL (ref 8.6–10.2)
Chloride: 104 mEq/L (ref 99–109)
Creatinine: 0.75 mg/dL (ref 0.50–1.10)
Glucose: 85 mg/dL (ref 60–99)
Lab: 16 mg/dL (ref 9–23)
Potassium: 3.9 mEq/L (ref 3.5–5.1)
Sodium: 139 mEq/L (ref 136–145)
UN/Creat Ratio: 21.3 — ABNORMAL HIGH (ref 10.0–20.0)

## 2016-08-03 LAB — CBC
Hematocrit: 37 % — ABNORMAL LOW (ref 40–52)
Hemoglobin: 12.4 g/dL (ref 11.5–16.0)
MCH: 31 pg (ref 26.0–34.0)
MCHC: 33.4 g/dL (ref 32.0–36.0)
MCV: 93 fL (ref 81–99)
Platelets: 237 10*3/uL (ref 140–400)
RBC: 4 10*6/uL (ref 3.80–5.20)
RDW: 12.6 % (ref 11.5–15.0)
WBC: 5.6 10*3/uL (ref 4.0–10.0)

## 2016-08-03 LAB — LIPID PANEL
Chol/HDL Ratio: 2.3
Cholesterol: 186 mg/dL (ref 1–199)
HDL: 80 mg/dL — ABNORMAL HIGH (ref 40–60)
LDL Calculated: 94 mg/dL (ref 0–99)
Non HDL Cholesterol: 106 mg/dL (ref 0–129)
Triglycerides: 62 mg/dL (ref 0–149)

## 2016-08-03 LAB — TROPONIN T
Troponin T: <0.01 ng/mL (ref 0.00–0.04)
Troponin T: <0.01 ng/mL (ref 0.00–0.04)

## 2016-08-03 LAB — ESTIMATED GFR
GFR,Black: >60 mL/min
GFR,Caucasian: >60 mL/min

## 2016-08-04 ENCOUNTER — Encounter: Payer: Self-pay | Admitting: Primary Care

## 2016-08-04 DIAGNOSIS — Z9289 Personal history of other medical treatment: Secondary | ICD-10-CM | POA: Insufficient documentation

## 2016-08-10 ENCOUNTER — Ambulatory Visit: Payer: Medicare (Managed Care) | Admitting: Primary Care

## 2016-08-11 ENCOUNTER — Ambulatory Visit: Payer: Medicare (Managed Care) | Attending: Primary Care | Admitting: Primary Care

## 2016-08-11 ENCOUNTER — Encounter: Payer: Self-pay | Admitting: Primary Care

## 2016-08-11 VITALS — BP 128/80 | HR 89 | Temp 97.8°F | Ht 61.0 in | Wt 189.0 lb

## 2016-08-11 DIAGNOSIS — R0789 Other chest pain: Secondary | ICD-10-CM

## 2016-08-11 DIAGNOSIS — G35 Multiple sclerosis: Secondary | ICD-10-CM

## 2016-08-11 DIAGNOSIS — H6123 Impacted cerumen, bilateral: Secondary | ICD-10-CM

## 2016-08-11 MED ORDER — PREDNISONE 50 MG PO TABS *I*
50.0000 mg | ORAL_TABLET | Freq: Every day | ORAL | 0 refills | Status: DC
Start: 2016-08-11 — End: 2016-12-29

## 2016-08-11 NOTE — Patient Instructions (Signed)
Dr Delane Ginger   Address: [address] [place], [place], [place] [address]   Phone: [phone]

## 2016-08-11 NOTE — Progress Notes (Signed)
[facility]    SUBJECTIVE    Pt is here to discuss:    Chief Complaint   Patient presents with    Follow-up     neurologist visit          1. Atypical chest pain - has been having retrosternal chest tightness/squeezing sensation for 2-3 weeks. At first thought it was a mult sclerosis flare, as she was also having headaches and facial numbness that typically come along with it. Calls it an "M S hug". Seen by neuro and requested steroids, but was told she needed cardiac w/u and was referred to [place]. While awaiting appt had severe intensification of pain, radiation to R arm, b/l hand numbness, and was triggered by exertion. Presented to ED. Was given nitro but describes terrible reaction (dizziness, h/a) and no improvement in the pain. Had cardiac w/u including d dimer, serial trops, tele, dobutamine stress echo normal. D/c-ed home and instructed to increase her tizanidine and cymbalta out of concern it was MSK. Wife agrees that she cuold reproduce the pain on palpation of her ribs at one point. Pt notices sedation with tizanidine but feels it's just starting to help. Would prefer NOT to increase her cymbalta.   Here with her wife and mother, who all express frustration she wasn't given steroids as they still feel this is an M S flare. Her previous neuro NP has retired and they do not feel comfortable with her current neurologist.    2. Decreased hearing - requesting cerumen removal. Feels L>R pain and muffled hearing.    PMH / Family Hx / Social Hx  Patient's medications, allergies, problem list, past medical, social histories were reviewed and notable for:    Current Outpatient Prescriptions   Medication Sig    tiZANidine (ZANAFLEX) 4 MG tablet Take 1 tablet (4 mg total) by mouth every 8 hours as needed for Muscle spasms\    Increased to q6h at ED     buPROPion (WELLBUTRIN XL) 300 MG 24 hr tablet Take 1 tablet (300 mg total) by mouth every morning   Swallow whole. Do not crush, break, or chew.       DULoxetine (CYMBALTA) 30 MG DR capsule Take 1 capsule (30 mg total) by mouth daily    Increased to 60mg  at ED    carbamide peroxide (DEBROX) 6.5 % otic solution Place in ear four times daily 24h prior to appts for removal    nystatin (NYSTOP) 100000 UNIT/GM powder Apply topically 2 times daily as needed for Itching    cetirizine (ZYRTEC) 10 MG tablet Take 10 mg by mouth daily    ibuprofen (ADVIL,MOTRIN) 200 MG tablet Take 400 mg by mouth 2 times daily as needed for Pain    glatiramer (COPAXONE) 20 MG/ML injection Inject 40 mg into the skin          ROS  +wt gain (30lb in 55yr)  Denies gait instability, vision changes, urinary concerns      OBJECTIVE  Vitals:    08/11/16 1508   BP: 128/80   Pulse: 89   Temp: 36.6 C (97.8 F)   TempSrc: Temporal   Weight: 85.7 kg (189 lb)   Height: 1.549 m (5\' 1" )     Body mass index is 35.71 kg/(m^2).      General: well-appearing Caucasian female, pleasant & conversant, in NAD  HENT: L TM obscured by hard cerumen. R TM partially obscured. TMs normal appearing after cerumen removal. L canal is tortuous and narrow  Lungs: Normal resp effort. CTAB.  No crackles or wheezes. Mild tenderness on palpation of L lower rib cage at costocondral juction; wife describes this is less than her reaction prior to ED.   Cardiac: RRR, no M/R/G. No pedal edema. Pulses 2+ b/l.      Psych: AAOx3, normal affect and mood. Insight and judgement intact.     CERUMEN REMOVAL / MANUAL WITH CERUMEN CURETTE --- PROCEDURE NOTE    PRE-PROCEDURE EXAM: B/L TM cannot be visualized due to total occlusion/impaction of the ear canal.    PROCEDURE INDICATION: remove wax to visualize ear drum,  improve hearing & relieve discomfort    CONSENT:  Verbal    PROCEDURE NOTE:     RIGHT EAR:  I used an ear wax curette (The Lighted Ear Currette with Magnification) under direct vision with an otoscope to free the wax bolus from the ear wall and then successfully removed the impacted wax.     LEFT EAR: I used an ear wax curette  (The Lighted Ear Currette with Magnification) under direct vision with an otoscope to free the wax bolus from the ear wall and then successfully removed the impacted wax.     POST- PROCEDURE EXAM: B/L TM successfully visualized and found to be intact with no redness or perforation.90% of ear wax had been removed from the ear canal.    POST-PROCEDURE INSTRUCTIONS: avoid use of Q-tips.    CPT Code: 09811           ASSESSMENT & PLAN  1. Atypical chest pain  2. Multiple sclerosis  - predniSONE (DELTASONE) 50 MG tablet; Take 1 tablet (50 mg total) by mouth daily  Dispense: 10 tablet; Refill: 0    Ddx of exertional atypical chest pain is costochondritis vs rib muscle strain vs M S flare. Discussed with pt that I do not have experience with acute M S flare treatments and per [employer] the dosing of steroids is quite high. Reviewed I am comfortable with 50mg  dose to start x10d, reviewed we do not typically need a taper. This will help with costochondral inflammation as well. If no response, will need neurology involvement. Cardiac cause has been ruled out. Reviewed s/s of GERD, but this would be not t ypically caused by exertion nor reproducible on palpation. Will f/u in 2d after steroid treatmetn.    3. Hearing loss due to cerumen impaction, bilateral  Cerumen removed today with immediate improvement in pain and hearing.  '    Follow-up: 2d via phone, q55mo otherwise      Farrell Ours, MD  [facility]  08/11/2016  3:25 PM        ______________________

## 2016-08-12 ENCOUNTER — Other Ambulatory Visit: Payer: Self-pay | Admitting: Gastroenterology

## 2016-08-13 ENCOUNTER — Encounter: Payer: Self-pay | Admitting: Primary Care

## 2016-08-13 DIAGNOSIS — G35 Multiple sclerosis: Secondary | ICD-10-CM

## 2016-08-16 NOTE — Telephone Encounter (Signed)
Left message on machine for pt to return call or read/respond to mychart.

## 2016-08-16 NOTE — Telephone Encounter (Signed)
Please review mychart message with pt.  Farrell Ours, MD  [facility]  08/16/2016  8:16 AM

## 2016-08-18 ENCOUNTER — Encounter: Payer: Self-pay | Admitting: Primary Care

## 2016-08-18 DIAGNOSIS — M47812 Spondylosis without myelopathy or radiculopathy, cervical region: Secondary | ICD-10-CM | POA: Insufficient documentation

## 2016-08-23 ENCOUNTER — Encounter: Payer: Self-pay | Admitting: Gastroenterology

## 2016-08-23 ENCOUNTER — Other Ambulatory Visit
Admission: RE | Admit: 2016-08-23 | Discharge: 2016-08-23 | Disposition: A | Discharge status: Home / Self Care | Payer: Medicare (Managed Care) | Source: Ambulatory Visit | Attending: Neurology | Admitting: Neurology

## 2016-08-23 DIAGNOSIS — G35 Multiple sclerosis: Secondary | ICD-10-CM | POA: Insufficient documentation

## 2016-08-23 LAB — SEDIMENTATION RATE, AUTOMATED: Sedimentation Rate: 2 mm/hr (ref 0–20)

## 2016-08-23 LAB — URINALYSIS REFLEX TO CULTURE
Blood,UA: NEGATIVE
Ketones, UA: NEGATIVE
Leuk Esterase,UA: NEGATIVE
Nitrite,UA: NEGATIVE
Protein,UA: NEGATIVE mg/dL
Specific Gravity,UA: 1.012 (ref 1.002–1.030)
pH,UA: 7 (ref 5.0–8.0)

## 2016-08-23 LAB — CBC AND DIFFERENTIAL
Baso # K/uL: 0.1 10*3/uL (ref 0.0–0.1)
Basophil %: 0.8 %
Eos # K/uL: 0.1 10*3/uL (ref 0.0–0.4)
Eosinophil %: 0.8 %
Hematocrit: 44 % (ref 34–45)
Hemoglobin: 14.5 g/dL (ref 11.2–15.7)
IMM Granulocytes #: 0 10*3/uL (ref 0.0–0.1)
IMM Granulocytes: 0.3 %
Lymph # K/uL: 2.4 10*3/uL (ref 1.2–3.7)
Lymphocyte %: 33.5 %
MCH: 31 pg/cell (ref 26–32)
MCHC: 33 g/dL (ref 32–36)
MCV: 94 fL (ref 79–95)
Mono # K/uL: 0.5 10*3/uL (ref 0.2–0.9)
Monocyte %: 6.3 %
Neut # K/uL: 4.2 10*3/uL (ref 1.6–6.1)
Nucl RBC # K/uL: 0 10*3/uL (ref 0.0–0.0)
Nucl RBC %: 0 /100 WBC (ref 0.0–0.2)
Platelets: 327 10*3/uL (ref 160–370)
RBC: 4.6 MIL/uL (ref 3.9–5.2)
RDW: 12.3 % (ref 11.7–14.4)
Seg Neut %: 58.3 %
WBC: 7.3 10*3/uL (ref 4.0–10.0)

## 2016-08-23 LAB — COMPREHENSIVE METABOLIC PANEL
ALT: 23 U/L (ref 0–35)
AST: 20 U/L (ref 0–35)
Albumin: 4.7 g/dL (ref 3.5–5.2)
Alk Phos: 62 U/L (ref 35–105)
Anion Gap: 13 (ref 7–16)
Bilirubin,Total: 0.4 mg/dL (ref 0.0–1.2)
CO2: 30 mmol/L — ABNORMAL HIGH (ref 20–28)
Calcium: 9.7 mg/dL (ref 8.8–10.2)
Chloride: 99 mmol/L (ref 96–108)
Creatinine: 0.84 mg/dL (ref 0.51–0.95)
GFR,Black: 97 *
GFR,Caucasian: 84 *
Glucose: 84 mg/dL (ref 60–99)
Lab: 10 mg/dL (ref 6–20)
Potassium: 4.5 mmol/L (ref 3.3–5.1)
Sodium: 142 mmol/L (ref 133–145)
Total Protein: 7 g/dL (ref 6.3–7.7)

## 2016-08-23 LAB — HIGH SENSITIVITY CRP: CRP,High Sensitivity: 0.2 mg/L

## 2016-08-30 ENCOUNTER — Ambulatory Visit
Payer: Medicare (Managed Care) | Attending: Pulmonary and Critical Care Medicine | Admitting: Pulmonary and Critical Care Medicine

## 2016-08-30 ENCOUNTER — Encounter: Payer: Self-pay | Admitting: Pulmonary and Critical Care Medicine

## 2016-08-30 VITALS — BP 120/76 | HR 101 | Temp 97.8°F | Resp 16 | Ht 61.0 in | Wt 190.0 lb

## 2016-08-30 DIAGNOSIS — R0789 Other chest pain: Secondary | ICD-10-CM

## 2016-08-30 DIAGNOSIS — K219 Gastro-esophageal reflux disease without esophagitis: Secondary | ICD-10-CM

## 2016-08-30 DIAGNOSIS — G35 Multiple sclerosis: Secondary | ICD-10-CM

## 2016-08-30 MED ORDER — OMEPRAZOLE 20 MG PO CPDR *I*
20.0000 mg | DELAYED_RELEASE_CAPSULE | Freq: Every day | ORAL | 0 refills | Status: DC
Start: 2016-08-30 — End: 2017-05-03

## 2016-08-30 NOTE — Progress Notes (Signed)
[facility] - Outpatient Progress Note    SUBJECTIVE    Jodi Butler is a 46 y.o. female who presents, for Shortness of Breath (going on since February 7th and last 2 days increased SOB, with leg weakness , on prednisone for the MS episode ) and Chest Pain      -here with wife and mom    1. Multiple Sclerosis/Chest pain: has been having chest pain, right in the middle of her chest, some SOB since 08/11/16-was on a prednisone drip with neurology at home for her MS, now on oral prednisone 50 mg x 14 days  -today started having jaw pain-entire jaw, feels clowdy in her mind  -increased chest pain when walking, also winded at rest, weak legs  -no nausea/vomiting  -maybe coughing, no cold-like s/s   -has had full cardiac work-up-WNL      Patient's medications were reviewed and reconciled with the patient at visit today.        ROS: see HPI above    Current Outpatient Prescriptions   Medication Sig    predniSONE (DELTASONE) 50 MG tablet Take 1 tablet (50 mg total) by mouth daily    tiZANidine (ZANAFLEX) 4 MG tablet Take 1 tablet (4 mg total) by mouth every 8 hours as needed for Muscle spasms    buPROPion (WELLBUTRIN XL) 300 MG 24 hr tablet Take 1 tablet (300 mg total) by mouth every morning   Swallow whole. Do not crush, break, or chew.    carbamide peroxide (DEBROX) 6.5 % otic solution Place in ear four times daily 24h prior to appts for removal    nystatin (NYSTOP) 100000 UNIT/GM powder Apply topically 2 times daily as needed for Itching    cetirizine (ZYRTEC) 10 MG tablet Take 10 mg by mouth daily    ibuprofen (ADVIL,MOTRIN) 200 MG tablet Take 400 mg by mouth 2 times daily as needed for Pain    glatiramer (COPAXONE) 20 MG/ML injection Inject 40 mg into the skin       DULoxetine (CYMBALTA) 30 MG DR capsule Take 1 capsule (30 mg total) by mouth daily         OBJECTIVE  Vitals Reviewed:  BP 120/76   Pulse 101   Temp 36.6 C (97.8 F)   Resp 16   Ht 1.549 m (5\' 1" )   Wt 86.2 kg (190 lb)   LMP  08/23/2016   SpO2 100%   BMI 35.9 kg/m2    Wt Readings from Last 3 Encounters:   08/30/16 86.2 kg (190 lb)   08/11/16 85.7 kg (189 lb)   02/12/15 73.6 kg (162 lb 3.2 oz)        Hemoglobin A1C   Date Value Ref Range Status   12/10/2013 5.3 4.0 - 6.0 % Final     Comment:                                   NON-DIABETIC          GENERAL: A&Ox3, pleasant, cooperative, well groomed, appropriately dressed, actively engaged in encounter, NAD.  CARDIOVASCULAR: RR, S1 S2, no murmers, gallops, rubs.  RESPIRATORY: RRR, unlabored. Breath sounds clear to auscultation bilaterally, no adventitious breath sounds, full and symmetrical lung expansion.      ASSESSMENT & PLAN    1. Atypical chest pain  -ongoing  -EKG WNL, normal RR, no acute changes  -may have some GERD-r/t chest  pain  - omeprazole (PRILOSEC) 20 MG capsule; Take 1 capsule (20 mg total) by mouth daily (before breakfast)  Dispense: 30 capsule; Refill: 0    2. Gastroesophageal reflux disease, esophagitis presence not specified  -new  -GERD avoidance measures/food weight-loss discussed  - omeprazole (PRILOSEC) 20 MG capsule; Take 1 capsule (20 mg total) by mouth daily (before breakfast)  Dispense: 30 capsule; Refill: 0    3. Multiple sclerosis  -chronic  -continue prednisone 50 mg daily per neurologist  -advised to f/u with him r/t uncontrolled pain in chest    F/u in 2-3 weeks with PCP    Health Maintenance    Health Maintenance   Topic Date Due    BREAST CANCER SCREENING  11/02/2015    DEPRESSION SCREEN YEARLY  11/12/2015    CERVICAL CANCER SCREEN PAP EVERY 5 YEARS  08/10/2018    HIV TESTING OFFERED  Addressed    IMM-INFLUENZA  Completed       Patient verbalized understanding of information presented,and confirmed agreement with current plan of care.  Reviewed risks, benefits, and administration of prescribed/refilled medications. Precautions reviewed.    MyChart:  Activated [1]

## 2016-09-16 ENCOUNTER — Ambulatory Visit: Payer: Medicare (Managed Care) | Admitting: Pulmonary and Critical Care Medicine

## 2016-10-08 ENCOUNTER — Other Ambulatory Visit: Payer: Self-pay | Admitting: Primary Care

## 2016-10-08 MED ORDER — DULOXETINE HCL 30 MG PO CPEP *I*
30.0000 mg | DELAYED_RELEASE_CAPSULE | Freq: Every day | ORAL | 1 refills | Status: DC
Start: 2016-10-08 — End: 2017-04-08

## 2016-12-11 ENCOUNTER — Telehealth: Payer: Self-pay | Admitting: Primary Care

## 2016-12-11 DIAGNOSIS — M545 Low back pain, unspecified: Secondary | ICD-10-CM

## 2016-12-11 MED ORDER — METHYLPREDNISOLONE 4 MG PO TBPK *A*
ORAL_TABLET | ORAL | 0 refills | Status: DC
Start: 2016-12-11 — End: 2016-12-29

## 2016-12-11 NOTE — Telephone Encounter (Signed)
[facility]  After Hours / On-Call Note    Date of Call: 12/11/16  Time of Call: 9:30 AM    PCP:  Dr. Christell Constant     Caller: wife [name]    Problem:  Patient was doing her hair yesterday AM, turned funny and put her low back into spasm. Has gotten progressively more unbearable in the past 24 hours. 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 She denies numbness, weakness, loss of bowel or bladder function.         Disposition/Plan: Medrol dose pack. Reviewed indications to go to ED (overt numbness or weakness of lower extremities, incontinence, worsening of pain.) Continue Ibuprofen and Tizanidine around the clock.       The patient is instructed to call back if patient is having worsening symptoms or lack of improvement of current symptoms with the above treatment     Vernona Rieger E. Delano Frate DO   Family Medicine

## 2016-12-13 NOTE — Telephone Encounter (Signed)
Please call the patient today and see how she is doing? If worse, please offer to schedule an appointment. Thanks. NG

## 2016-12-13 NOTE — Telephone Encounter (Signed)
Called patient to see how she is doing , and she said she has had some improvement and it has not gotten any worse, she said that she has been doing what has been recommended and so far it is helping.

## 2016-12-28 ENCOUNTER — Encounter: Payer: Self-pay | Admitting: Primary Care

## 2016-12-28 DIAGNOSIS — G35 Multiple sclerosis: Secondary | ICD-10-CM

## 2016-12-29 NOTE — Telephone Encounter (Signed)
Please help with referral once pt alerts US of the dr she prefers

## 2016-12-30 MED ORDER — GLATIRAMER ACETATE 40 MG/ML SC SOSY *I*
40.0000 mg | PREFILLED_SYRINGE | SUBCUTANEOUS | 2 refills | Status: DC
Start: 2016-12-31 — End: 2017-05-02

## 2017-01-05 ENCOUNTER — Encounter: Payer: Self-pay | Admitting: Primary Care

## 2017-01-05 MED ORDER — CETIRIZINE HCL 10 MG PO TABS *I*
10.0000 mg | ORAL_TABLET | Freq: Every day | ORAL | 1 refills | Status: DC
Start: 2017-01-05 — End: 2017-01-10

## 2017-01-08 ENCOUNTER — Encounter: Payer: Self-pay | Admitting: Primary Care

## 2017-01-08 ENCOUNTER — Other Ambulatory Visit: Payer: Self-pay | Admitting: Primary Care

## 2017-01-10 MED ORDER — CETIRIZINE HCL 10 MG PO TABS *I*
10.0000 mg | ORAL_TABLET | Freq: Every day | ORAL | 1 refills | Status: DC
Start: 2017-01-10 — End: 2017-07-12

## 2017-03-06 ENCOUNTER — Other Ambulatory Visit: Payer: Self-pay | Admitting: Primary Care

## 2017-03-06 DIAGNOSIS — F32A Depression, unspecified: Secondary | ICD-10-CM

## 2017-03-07 MED ORDER — BUPROPION HCL 300 MG PO TB24 *I*
300.0000 mg | ORAL_TABLET | Freq: Every morning | ORAL | 3 refills | Status: DC
Start: 2017-03-07 — End: 2018-02-16

## 2017-04-08 ENCOUNTER — Other Ambulatory Visit: Payer: Self-pay | Admitting: Primary Care

## 2017-04-29 ENCOUNTER — Encounter: Payer: Self-pay | Admitting: Primary Care

## 2017-04-29 DIAGNOSIS — G35 Multiple sclerosis: Secondary | ICD-10-CM

## 2017-05-02 MED ORDER — GLATIRAMER ACETATE 40 MG/ML SC SOSY *I*
40.0000 mg | PREFILLED_SYRINGE | SUBCUTANEOUS | 0 refills | Status: DC
Start: 2017-05-02 — End: 2017-05-12

## 2017-05-02 NOTE — Telephone Encounter (Signed)
Please fax Rx in my office as directed In mychart message    Farrell Ours, MD  [facility]  05/02/2017  9:57 AM

## 2017-05-03 ENCOUNTER — Ambulatory Visit
Admission: AD | Admit: 2017-05-03 | Discharge: 2017-05-03 | Disposition: A | Discharge status: Home / Self Care | Payer: Medicare (Managed Care) | Source: Ambulatory Visit | Attending: Emergency Medicine | Admitting: Emergency Medicine

## 2017-05-03 ENCOUNTER — Encounter: Payer: Self-pay | Admitting: Emergency Medicine

## 2017-05-03 DIAGNOSIS — B029 Zoster without complications: Secondary | ICD-10-CM | POA: Insufficient documentation

## 2017-05-03 MED ORDER — VALACYCLOVIR HCL 1000 MG PO TABS *I*
1000.0000 mg | ORAL_TABLET | Freq: Three times a day (TID) | ORAL | 0 refills | Status: AC
Start: 2017-05-03 — End: 2017-05-10

## 2017-05-03 MED ORDER — VALACYCLOVIR HCL 1000 MG PO TABS *I*
1000.0000 mg | ORAL_TABLET | Freq: Two times a day (BID) | ORAL | 0 refills | Status: DC
Start: 2017-05-03 — End: 2017-05-03

## 2017-05-03 NOTE — ED Triage Notes (Incomplete)
rash on left shoulder area down front of left chest area, pain, reddness, x 1 day       Triage Note   Modesto Charon, LPN

## 2017-05-03 NOTE — ED Notes (Signed)
Patient given discharge instructions. Verbalizes understanding. Patient left with all belongings.

## 2017-05-03 NOTE — UC Provider Note (Signed)
History     Chief Complaint   Patient presents with    Rash     rash on left shoulder area down front of left chest area, pain, reddness, x 1 day     Jodi Butler is a 46 yo female with hx of multiple sclerosis who presents with rash since this afternoon.    Pt reports noticing a "burning and scratching" sensation on upper left side of chest this morning. Pt denies recent trauma to area. Pt states later in the day she noticed onset of a rash near her left shoulder. She denies recent changes in detergent, makeup, or perfume. She denies fevers, chills, nausea, vomiting, chest pain, shortness of breath, visual changes, sick contacts.            Medical/Surgical/Family History     History reviewed. No pertinent past medical history.     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            Past Surgical History:   Procedure Laterality Date    APPENDECTOMY       Family History   Problem Relation Age of Onset    Multiple Sclerosis Father     Stroke Maternal Grandmother     Alzheimer's disease Maternal Grandmother     Hypertension Mother     Breast cancer Paternal Aunt [id number]    Breast cancer Paternal Grandmother     Lung cancer Paternal Uncle           Social History   Substance Use Topics    Smoking status: Former Smoker     Packs/day: 1.00     Years: 5.00     Types: Cigarettes     Quit date: 06/07/1996    Smokeless tobacco: Never Used    Alcohol use Yes      Comment: <1     Living Situation     Questions Responses    Patient lives with     Homeless     Caregiver for other family member     External Services     Employment     Domestic Violence Risk                 Review of Systems   Review of Systems   Constitutional: Negative for activity change, appetite change, chills, diaphoresis, fatigue and fever.   HENT:  Negative for mouth sores and sore throat.    Eyes: Negative for photophobia and visual disturbance.   Respiratory: Negative for cough, chest tightness, shortness of breath and wheezing.    Cardiovascular: Negative for chest pain.   Gastrointestinal: Negative for abdominal pain, diarrhea, nausea and vomiting.   Musculoskeletal: Negative for gait problem, myalgias, neck pain and neck stiffness.   Skin: Positive for rash. Negative for color change, pallor and wound.   Neurological: Negative for dizziness, weakness, light-headedness and numbness.   Psychiatric/Behavioral: The patient is not nervous/anxious.        Physical Exam   Triage Vitals  Triage Start: Start, (05/03/17 1958)   First Recorded BP: 130/66, Temp: 36.5 C (97.7 F), Temp src: TEMPORAL Oxygen Therapy SpO2: 99 %, Oximetry Source: Rt Hand, O2 Device: None (Room air), Heart Rate: 86, (05/03/17 1958)  .      Physical  Exam   Constitutional: She is oriented to person, place, and time. She appears well-developed and well-nourished. No distress.   HENT:   Head: Normocephalic and atraumatic.   Right Ear: External ear normal.   Left Ear: External ear normal.   Eyes: Pupils are equal, round, and reactive to light. Conjunctivae and EOM are normal. Right eye exhibits no discharge. Left eye exhibits no discharge.   Neck: Normal range of motion.   Cardiovascular: Normal rate, regular rhythm, normal heart sounds and intact distal pulses.  Exam reveals no gallop and no friction rub.    No murmur heard.  Pulmonary/Chest: Effort normal and breath sounds normal. No respiratory distress. She has no wheezes. She has no rales. She exhibits no tenderness.   Abdominal: Soft.   Musculoskeletal: Normal range of motion. She exhibits no edema or deformity.   Neurological: She is alert and oriented to person, place, and time. No cranial nerve deficit.   Skin: Skin is warm and dry. Capillary refill takes less than 2 seconds. Rash (per below picture, no vessicles apparent, no  fluctuance) noted. She is not diaphoretic.   Psychiatric: She has a normal mood and affect. Her behavior is normal. Judgment and thought content normal.   Nursing note and vitals reviewed.               Medical Decision Making      Amount and/or Complexity of Data Reviewed  Review and summarize past medical records: yes        Initial Evaluation:  ED First Provider Contact     Date/Time Event User Comments    05/03/17 1953 ED First Provider Contact Irene Limbo Initial Face to Face Provider Contact        Patient was seen on: 05/03/2017    Assessment:  46 y.o.female comes to the Urgent Care Center with prodromal symptoms of burning prior to rash consistent with shingles. She does not have vesicles at this point although she is hours into rash development so I believe it was caught at it's early stages    Differential Diagnosis includes: herpes zoster, not consistent with cellulitis, no fluctuance to suggest abscess, pt has not changed perfume/soap/or detergent to suggest contact dermatitis and there is no associated pruritis     Plan:   - will treat with 7 day course of valacyclovir, pt denying needing pain medications at this time  - PCP follow up   - precautions regarding being infectious when vesicles appear and until they disappear or epithelialize       Final Diagnosis  Final diagnoses:   [B02.9] Herpes zoster without complication (Primary)     Irene Limbo, PA          [place], [place], [place]  05/04/17 1919

## 2017-05-03 NOTE — Discharge Instructions (Signed)
Your symptoms are consistent with herpes zoster (shingles)    Please take valacyclovir 1,000 mg three times daily for 7 days.     Please call PCP to update.    Please return to ED or Urgent care if your rash worsens, there is discharge, you have fevers, chills, or any other symptoms which concern you.

## 2017-05-04 ENCOUNTER — Encounter: Payer: Self-pay | Admitting: Primary Care

## 2017-05-05 MED ORDER — DULOXETINE HCL 60 MG PO CPEP *I*
60.0000 mg | DELAYED_RELEASE_CAPSULE | Freq: Every day | ORAL | 3 refills | Status: DC
Start: 2017-05-05 — End: 2017-07-12

## 2017-05-12 ENCOUNTER — Other Ambulatory Visit: Payer: Self-pay | Admitting: Primary Care

## 2017-05-25 ENCOUNTER — Encounter: Payer: Self-pay | Admitting: Gastroenterology

## 2017-06-08 ENCOUNTER — Other Ambulatory Visit: Payer: Self-pay

## 2017-06-08 ENCOUNTER — Ambulatory Visit
Admission: AD | Admit: 2017-06-08 | Discharge: 2017-06-08 | Disposition: A | Discharge status: Home / Self Care | Payer: Medicare (Managed Care) | Source: Ambulatory Visit | Attending: Emergency Medicine | Admitting: Emergency Medicine

## 2017-06-08 DIAGNOSIS — J069 Acute upper respiratory infection, unspecified: Secondary | ICD-10-CM | POA: Insufficient documentation

## 2017-06-08 DIAGNOSIS — B9789 Other viral agents as the cause of diseases classified elsewhere: Secondary | ICD-10-CM

## 2017-06-08 DIAGNOSIS — R05 Cough: Secondary | ICD-10-CM | POA: Insufficient documentation

## 2017-06-08 HISTORY — DX: Depression, unspecified: F32.A

## 2017-06-08 HISTORY — DX: Multiple sclerosis: G35

## 2017-06-08 HISTORY — DX: Multiple sclerosis, unspecified: G35.D

## 2017-06-08 MED ORDER — GUAIFENESIN-CODEINE 100-10 MG/5ML PO SYRP *I*
5.0000 mL | ORAL_SOLUTION | Freq: Every evening | ORAL | 0 refills | Status: DC | PRN
Start: 2017-06-08 — End: 2018-01-16

## 2017-06-08 MED ORDER — ALBUTEROL SULFATE HFA 108 (90 BASE) MCG/ACT IN AERS *I*
1.0000 | INHALATION_SPRAY | Freq: Four times a day (QID) | RESPIRATORY_TRACT | 0 refills | Status: DC | PRN
Start: 2017-06-08 — End: 2018-01-16

## 2017-06-08 MED ORDER — BENZONATATE 200 MG PO CAPS *I*
200.0000 mg | ORAL_CAPSULE | Freq: Three times a day (TID) | ORAL | 0 refills | Status: AC | PRN
Start: 2017-06-08 — End: 2017-06-18

## 2017-06-08 NOTE — UC Provider Note (Signed)
History     Chief Complaint   Patient presents with    Sore Throat     Patient presents with cough, sore throat and congestion since Friday.    Cough    Nasal Congestion     This is a 47 year old female with significant past medical history for multiple sclerosis presenting with cough and nasal congestion for the last 5 days.  Patient states that her symptoms are not improving.  She reports that she is bringing up thick green mucus.  She reports a history of bronchitis and discuss treatment of this may be.  She's been taking Tylenol cold and flu with minimal relief.  She does state that her chest feels tight with some shortness of breath.  She denies any fevers.            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            Past Surgical History:   Procedure Laterality Date    APPENDECTOMY       Family History   Problem Relation Age of Onset    Multiple Sclerosis Father     Stroke Maternal Grandmother     Alzheimer's disease Maternal Grandmother     Hypertension Mother     Breast cancer Paternal Aunt 59    Breast cancer Paternal Grandmother     Lung cancer Paternal Uncle           Social History   Substance Use Topics    Smoking status: Former Smoker     Packs/day: 1.00     Years: 5.00     Types: Cigarettes     Quit date: 06/07/1996    Smokeless tobacco: Never Used    Alcohol use Yes      Comment: <1     Living Situation     Questions Responses    Patient lives with Significant Other    Homeless     Caregiver for other family member     External Services     Employment Disabled    Domestic Violence Risk                 Review of Systems   Review of Systems   Constitutional:  Negative for chills and fever.   HENT: Positive for congestion, rhinorrhea and sore throat. Negative for ear pain, facial swelling, postnasal drip, sinus pressure and sneezing.    Respiratory: Positive for cough, chest tightness and shortness of breath. Negative for wheezing.    Cardiovascular: Negative for chest pain.   Gastrointestinal: Negative for nausea and vomiting.       Physical Exam   Triage Vitals  Triage Start: Start, (06/08/17 1520)   First Recorded BP: 136/69, Temp: 36.6 C (97.9 F), Temp src: TEMPORAL Oxygen Therapy SpO2: 98 %, Oximetry Source: Rt Hand, O2 Device: None (Room air), Heart Rate: 84, (06/08/17 1522)  .  First Pain Reported  0-10 Scale: 4, Pain Location/Orientation: Chest, (06/08/17 1522)       Physical Exam   Constitutional: She appears well-developed.   HENT:  Head: Normocephalic.   Right Ear: Tympanic membrane and ear canal normal.   Left Ear: Tympanic membrane and ear canal normal.   Mouth/Throat: Uvula is midline, oropharynx is clear and moist and mucous membranes are normal. Tonsils are 1+ on the right. Tonsils are 1+ on the left.   Eyes: Pupils are equal, round, and reactive to light.   Neck: Normal range of motion.   Cardiovascular: Normal rate and regular rhythm.    Pulmonary/Chest: Effort normal and breath sounds normal. No respiratory distress. She has no decreased breath sounds. She has no wheezes. She has no rhonchi. She has no rales.   Lymphadenopathy:     She has no cervical adenopathy.   Nursing note and vitals reviewed.       Medical Decision Making        Initial Evaluation:  ED First Provider Contact     Date/Time Event User Comments    06/08/17 1531 ED First Provider Contact Albie Bazin C Initial Face to Face Provider Contact          Patient was seen on: 06/08/2017        Assessment:  46 y.o.female comes to the Urgent Care Center with cough and congestion x 5 days. VSS and lungs CTAB.    Differential Diagnosis includes:  Viral Bronchitis  Nasopharyngitis    Rhinosinusitis  Viral URI  Post Nasal Drip  Sinusitis   Strep Pharyngitis  CAP      Plan:   Orders Placed This Encounter    guaiFENesin-codeine (GUAITUSS AC) 100-10 MG/5ML liquid    benzonatate (TESSALON) 200 MG capsule    albuterol HFA 108 (90 Base) MCG/ACT inhaler         Final Diagnosis    ICD-10-CM ICD-9-CM   1. Viral URI with cough J06.9 465.9    B97.89        Encourage fluids, encourage rest, good hand hygiene.    Use over the counter medications as discussed.    Please start the new medications as below:    Current Discharge Medication List      New Medications    Details Last Dose Given Next Dose Due Script Given?   guaiFENesin-codeine (GUAITUSS AC) 5 mLs Dose: 5 mLs  Take 5 mLs by mouth nightly as needed for Cough    Quantity 118 mL, Refill 0  Start date: 06/08/2017            benzonatate (TESSALON) 200 mg Dose: 200 mg  Take 200 mg by mouth 3 times daily as needed for Cough    Quantity 30 capsule, Refill 0  Start date: 06/08/2017, End date: 06/18/2017            albuterol HFA (PROVENTIL, VENTOLIN, PROAIR HFA) 1-2 puffs Dose: 1-2 puffs  Inhale 1-2 puffs into the lungs every 6 hours as needed for Wheezing   Shake well before each use.  Quantity 1 Inhaler, Refill 0  Start date: 06/08/2017       Comments: Emergency Encounter                   Please follow up with your physician as below:    Follow-up Information     Farrell Ours, MD.    Specialties:  Primary Care, Family Medicine  Why:  As needed  Contact information:  [address]  [address]  [place] [address]  [phone]  Thank you Gorden Harms for coming to [facility] for your health care concerns.    If your condition changes and/or worsens please follow up with her primary doctor and/or return to the urgent care center.    If short of breath, chest pains or any other concerns please report to the emergency room.    In the event of an Emergency dial 911.             Marcelle Smiling, PA              [place], [place]  06/08/17  [phone]

## 2017-06-08 NOTE — Discharge Instructions (Signed)
Use the albuterol inhaler every 4-6 hours. You can take the benzonatate up to 3 times a day as needed for cough.  Take the Cheratussin at nighttime to help with sleeping.  Keep head elevated at night.  You can try warm liquids such as tea with honey and lemon.  Follow-up if symptoms worsen such as worsening shortness of breath or persistent fevers.

## 2017-06-09 ENCOUNTER — Ambulatory Visit: Payer: Medicare (Managed Care) | Admitting: Primary Care

## 2017-07-12 ENCOUNTER — Other Ambulatory Visit: Payer: Self-pay | Admitting: Primary Care

## 2017-07-12 DIAGNOSIS — Z9109 Other allergy status, other than to drugs and biological substances: Secondary | ICD-10-CM

## 2017-07-12 DIAGNOSIS — F32A Depression, unspecified: Secondary | ICD-10-CM

## 2017-07-13 MED ORDER — CETIRIZINE HCL 10 MG PO TABS *I*
10.0000 mg | ORAL_TABLET | Freq: Every day | ORAL | 1 refills | Status: DC
Start: 2017-07-13 — End: 2018-01-03

## 2017-07-13 MED ORDER — DULOXETINE HCL 60 MG PO CPEP *I*
60.0000 mg | DELAYED_RELEASE_CAPSULE | Freq: Every day | ORAL | 3 refills | Status: DC
Start: 2017-07-13 — End: 2018-07-03

## 2017-09-15 ENCOUNTER — Ambulatory Visit
Admission: AD | Admit: 2017-09-15 | Discharge: 2017-09-15 | Disposition: A | Discharge status: Home / Self Care | Payer: Medicare (Managed Care) | Source: Ambulatory Visit | Attending: Emergency Medicine | Admitting: Emergency Medicine

## 2017-09-15 DIAGNOSIS — R062 Wheezing: Secondary | ICD-10-CM | POA: Insufficient documentation

## 2017-09-15 DIAGNOSIS — Z87891 Personal history of nicotine dependence: Secondary | ICD-10-CM | POA: Insufficient documentation

## 2017-09-15 DIAGNOSIS — J40 Bronchitis, not specified as acute or chronic: Secondary | ICD-10-CM | POA: Insufficient documentation

## 2017-09-15 MED ORDER — GUAIFENESIN 600 MG PO TB12 *I*
600.0000 mg | ORAL_TABLET | Freq: Two times a day (BID) | ORAL | 0 refills | Status: DC
Start: 2017-09-15 — End: 2018-01-16

## 2017-09-15 MED ORDER — PREDNISONE 20 MG PO TABS *I*
20.0000 mg | ORAL_TABLET | Freq: Every day | ORAL | 0 refills | Status: DC
Start: 2017-09-15 — End: 2018-06-15

## 2017-09-15 MED ORDER — BENZONATATE 100 MG PO CAPS *I*
100.0000 mg | ORAL_CAPSULE | Freq: Three times a day (TID) | ORAL | 0 refills | Status: DC | PRN
Start: 2017-09-15 — End: 2018-01-16

## 2017-09-15 MED ORDER — ALBUTEROL SULFATE HFA 108 (90 BASE) MCG/ACT IN AERS *I*
1.0000 | INHALATION_SPRAY | Freq: Four times a day (QID) | RESPIRATORY_TRACT | 0 refills | Status: AC | PRN
Start: 2017-09-15 — End: 2017-10-15

## 2017-09-15 NOTE — ED Triage Notes (Signed)
"  I think I have bronchitis". c/o deep cough since this am. Denies fever or chills.        Triage Note   Alverto Shedd P Dermot Gremillion, RN

## 2017-09-16 NOTE — UC Provider Note (Signed)
History     Chief Complaint   Patient presents with    URI     "I think I have bronchitis". c/o deep cough since this am. Denies fever or chills.        History provided by:  Patient  Language interpreter used: No    Cough   Cough characteristics:  Non-productive  Sputum characteristics:  Unable to specify  Severity:  Mild  Onset quality:  Sudden  Duration:  6 hours  Timing:  Intermittent  Progression:  Waxing and waning  Chronicity:  New  Smoker: no    Context: upper respiratory infection    Relieved by:  Beta-agonist inhaler  Worsened by:  Deep breathing and activity  Associated symptoms: rhinorrhea    Associated symptoms: no chest pain, no chills, no diaphoresis, no ear fullness, no ear pain, no eye discharge, no fever, no headaches, no myalgias, no rash, no shortness of breath, no sinus congestion and no sore throat    Associated symptoms comment:  Wheezing, History of reactive airway      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            Past Surgical History:   Procedure Laterality Date    APPENDECTOMY       Family History   Problem Relation Age of Onset    Multiple Sclerosis Father     Stroke Maternal Grandmother     Alzheimer's disease Maternal Grandmother     Hypertension Mother     Breast cancer Paternal Aunt 32    Breast cancer Paternal Grandmother     Lung cancer Paternal Uncle           Social History   Substance Use Topics    Smoking status: Former Smoker     Packs/day: 1.00     Years: 5.00     Types: Cigarettes     Quit date: 06/07/1996    Smokeless tobacco: Never Used    Alcohol use Yes      Comment: <1     Living Situation     Questions Responses    Patient  lives with Significant Other    Homeless No    Caregiver for other family member No    External Services None    Employment Disabled    Domestic Violence Risk No                Review of Systems   Review of Systems   Constitutional: Negative for activity change, appetite change, chills, diaphoresis and fever.   HENT: Positive for congestion and rhinorrhea. Negative for ear pain and sore throat.    Eyes: Negative for discharge.   Respiratory: Positive for cough. Negative for shortness of breath.    Cardiovascular: Negative for chest pain.   Gastrointestinal: Negative for abdominal pain.   Musculoskeletal: Negative for arthralgias, joint swelling and myalgias.   Skin: Negative for rash.   Neurological: Negative for headaches.        Pt has Multiple sclerosis and is concerned because illness and respiratory infections can make it worse  Physical Exam   Triage Vitals      First Recorded BP: 134/73, Temp: 36.6 C (97.9 F), Temp src: TEMPORAL Oxygen Therapy SpO2: 98 %, Oximetry Source: Rt Hand, O2 Device: None (Room air), Heart Rate: 86, (09/15/17 1948)  .      Physical Exam   Constitutional: She appears well-developed and well-nourished. No distress.   HENT:   Head: Normocephalic and atraumatic.   Mouth/Throat: Oropharynx is clear and moist.   Eyes: Pupils are equal, round, and reactive to light. Conjunctivae and EOM are normal. No scleral icterus.   Cardiovascular: Normal rate, regular rhythm and normal heart sounds.    Pulmonary/Chest: Effort normal. No stridor. No respiratory distress. She has wheezes. She has no rales. She exhibits no tenderness.   Abdominal: Soft. There is no tenderness.   Lymphadenopathy:     She has no cervical adenopathy.   Skin: No rash noted. She is not diaphoretic.   Psychiatric: She has a normal mood and affect.   Nursing note and vitals reviewed.       Medical Decision Making        Initial Evaluation:  ED First Provider Contact     Date/Time Event User Comments    09/15/17 1952 ED  First Provider Contact Elane Fritz Initial Face to Face Provider Contact          Patient was seen on: 09/15/2017        Assessment:  47 y.o.female comes [facility] the [facility] with cough and wheezing    Differential Diagnosis includes:  URI, reactive airway    Plan:   Orders Placed This Encounter    albuterol HFA 108 (90 Base) MCG/ACT inhaler    guaiFENesin (MUCINEX) 600 MG 12 hr tablet    benzonatate (TESSALON) 100 MG capsule    predniSONE (DELTASONE) 20 MG tablet       No results found for this or any previous visit (from the past 24 hour(s)).        Final Diagnosis    ICD-10-CM ICD-9-CM   1. Bronchitis J40 490       Encourage fluids, encourage rest, good hand hygiene.    Use over the counter medications as discussed.    Please start the new medications as below:    Current Discharge Medication List      New Medications    Details Last Dose Given Next Dose Due Script Given?   albuterol HFA (PROVENTIL, VENTOLIN, PROAIR HFA) 1-2 puffs Dose: 1-2 puffs  Inhale 1-2 puffs into the lungs every 6 hours as needed for Wheezing   Shake well before each use.  Quantity 1 Inhaler, Refill 0  Start date: 09/15/2017, End date: 10/15/2017       Comments: Emergency Encounter            guaiFENesin (MUCINEX) 600 mg Dose: 600 mg  Take 600 mg by mouth 2 times daily   Swallow whole. Do not crush, break, or chew.  Quantity 20 tablet, Refill 0  Start date: 09/15/2017            benzonatate (TESSALON) 100 mg Dose: 100 mg  Take 100 mg by mouth 3 times daily as needed for Cough    Swallow whole. Do not crush or chew.  Quantity 21 capsule, Refill 0  Start date: 09/15/2017            predniSONE (DELTASONE) 20 mg Dose: 20 mg  Take 20 mg by mouth daily    Quantity 4  tablet, Refill 0  Start date: 09/15/2017       Comments: Emergency Encounter                   Please follow up with your physician as below:        Thank you Gorden Harms for coming to [facility] for your health care concerns.    If your condition changes and/or  worsens please follow up with her primary doctor and/or return to the urgent care center.    If short of breath, chest pains or any other concerns please report to the emergency room.    In the event of an Emergency dial 911.           Final Diagnosis  Final diagnoses:   [J40] Bronchitis (Primary)         Hosie Poisson, PA              Hosie Poisson, [place]  09/16/17 1518

## 2017-09-18 ENCOUNTER — Ambulatory Visit
Admission: AD | Admit: 2017-09-18 | Discharge: 2017-09-18 | Disposition: A | Discharge status: Home / Self Care | Payer: Medicare (Managed Care) | Source: Ambulatory Visit | Attending: Emergency Medicine | Admitting: Emergency Medicine

## 2017-09-18 ENCOUNTER — Ambulatory Visit
Admit: 2017-09-18 | Discharge: 2017-09-18 | Disposition: A | Discharge status: Home / Self Care | Payer: Medicare (Managed Care)

## 2017-09-18 DIAGNOSIS — R918 Other nonspecific abnormal finding of lung field: Secondary | ICD-10-CM

## 2017-09-18 DIAGNOSIS — J209 Acute bronchitis, unspecified: Secondary | ICD-10-CM

## 2017-09-18 MED ORDER — AZITHROMYCIN 250 MG PO TABS *I*
ORAL_TABLET | ORAL | 0 refills | Status: AC
Start: 2017-09-18 — End: 2017-09-23

## 2017-09-18 NOTE — UC Provider Note (Addendum)
History     Chief Complaint   Patient presents with    URI     Since Thursday, progressed quickly w/cough.  At [facility], given prednisone/inhaler/tessalon.  States that these measures are not working.  Ears are burning, no sleep, chest burning pain w/coughing only.  Sore throat.  Denies F/C, but has been nauseated.       47 year old female with a history of MS, and depression presents with 4 days of sore throat, nasal congestion, semi-productive cough.  She has been using Tessalon, prednisone, and albuterol inhaler without relief.            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            Past Surgical History:   Procedure Laterality Date    APPENDECTOMY       Family History   Problem Relation Age of Onset    Multiple Sclerosis Father     Stroke Maternal Grandmother     Alzheimer's disease Maternal Grandmother     Hypertension Mother     Breast cancer Paternal Aunt 50    Breast cancer Paternal Grandmother     Lung cancer Paternal Uncle           Social History   Substance Use Topics    Smoking status: Former Smoker     Packs/day: 1.00     Years: 5.00     Types: Cigarettes     Quit date: 06/07/1996    Smokeless tobacco: Never Used    Alcohol use Yes      Comment: <1     Living Situation     Questions Responses    Patient lives with Significant Other    Homeless No    Caregiver for other family member No    External Services None    Employment Disabled    Domestic Violence Risk No                Review of Systems   Review of Systems   Constitutional: Positive for fatigue. Negative for chills and fever.   HENT: Positive for postnasal drip and sore throat. Negative for congestion.     Eyes: Negative for pain and discharge.   Respiratory: Positive for cough. Negative for chest tightness, shortness of breath and wheezing.    Cardiovascular: Negative for chest pain.   Gastrointestinal: Negative for abdominal pain, diarrhea, nausea and vomiting.   Musculoskeletal: Negative for arthralgias and myalgias.   Skin: Negative for rash.       Physical Exam   Triage Vitals  Triage Start: Start, (09/18/17 1546)   First Recorded BP: 144/64, Temp: 37.1 C (98.8 F), Temp src: TEMPORAL Oxygen Therapy SpO2: 99 %, Oximetry Source: Rt Hand, O2 Device: None (Room air), Heart Rate: (!) 127, (09/18/17 1531)  .      Physical Exam   Constitutional: She appears well-developed and well-nourished.   HENT:   Head: Normocephalic and atraumatic.   Right Ear: Hearing, tympanic membrane, external ear and ear canal normal.   Left Ear: Hearing, tympanic membrane, external ear and  ear canal normal.   Nose: Mucosal edema (mild) and rhinorrhea present. No nasal deformity.   Mouth/Throat: Oropharynx is clear and moist and mucous membranes are normal. No oral lesions. No uvula swelling. No oropharyngeal exudate, posterior oropharyngeal edema or posterior oropharyngeal erythema.   Eyes: Conjunctivae and EOM are normal.   Neck: Normal range of motion. Neck supple.   Cardiovascular: Normal rate and regular rhythm.  Exam reveals no gallop and no friction rub.    No murmur heard.  Pulmonary/Chest: Effort normal and breath sounds normal. She has no wheezes. She has no rales.   Lymphadenopathy:     She has no cervical adenopathy.   Neurological: She is alert.   Skin: Skin is warm and dry.   Psychiatric: She has a normal mood and affect. Her behavior is normal.   Nursing note and vitals reviewed.       Medical Decision Making        Initial Evaluation:  ED First Provider Contact     Date/Time Event User Comments    09/18/17 1611 ED First Provider Contact Magdeline Prange Initial Face to Face Provider Contact          Patient was seen on:  09/18/2017        Assessment:  46 y.o.female comes to the Urgent Care Center with persistent productive cough.  Chest x-ray negative for infiltrate per my interpretation.  Likely bronchitis.    Differential Diagnosis includes:  Viral Bronchitis  Nasopharyngitis   Rhinosinusitis  Viral URI  Post Nasal Drip  Sinusitis   Strep Pharyngitis  CAP      Plan:   Orders Placed This Encounter    *Chest standard frontal and lateral views    azithromycin (ZITHROMAX) 250 MG tablet       No results found for this or any previous visit (from the past 24 hour(s)).      Final Diagnosis    ICD-10-CM ICD-9-CM   1. Acute bronchitis, unspecified organism J20.9 466.0   Discussed the usual viral nature of bronchitis with this patient but given her immunocompromised status and her level of illness we'll give her a trial of antibiotics.    Encourage fluids, encourage rest, good hand hygiene.    Use over the counter medications as discussed in discharge paperwork.    Please start the new medications as below:    Current Discharge Medication List      New Medications    Details Last Dose Given Next Dose Due Script Given?   azithromycin (ZITHROMAX) 250 MG tablet Take 2 tablets (500 mg) on day 1, followed by 1 tablet (250 mg) on days 2-5.  Quantity 6 tablet, Refill 0  Start date: 09/18/2017, End date: 09/23/2017       Comments: Emergency Encounter                   Side effects and proper use of medications discussed.      If your condition changes and/or worsens please follow up with your primary doctor and/or return to the urgent care center.    If short of breath, chest pains or any other concerns please report to the emergency room.    In the event of an Emergency dial 911.           Final Diagnosis  Final diagnoses:   [J20.9] Acute bronchitis, unspecified organism (Primary)         Otto Herb, PA  Otto Herb, [place]  09/18/17 1653       Otto Herb, [place]  09/26/17 [phone]

## 2017-09-18 NOTE — Discharge Instructions (Signed)
Do not take other anti-inflammatory medications (Advil/ibuprofen, Aleve/naproxen, or Adult dose Aspirin) while taking prednisone.    You may use of over-the-counter Mucinex DM 600 mg-30 mg twice daily x 5 days for mucus/congestion/cough relief..    If your experience any new or evolving symptoms such as worsening shortness of breath, coughing up blood, difficulty breathing, or any new fever/chills/lethargy, you should call your primary doctor, return to the urgent care or go to the emergency department for further evaluation and treatment.

## 2017-09-18 NOTE — ED Triage Notes (Signed)
Since Thursday, progressed quickly w/cough.  At [facility], given prednisone/inhaler/tessalon.  States that these measures are not working.  Ears are burning, no sleep, chest burning pain w/coughing only.  Sore throat.  Denies F/C, but has been nauseated.         Triage Note   Rollene Fare, RN

## 2018-01-03 ENCOUNTER — Other Ambulatory Visit: Payer: Self-pay | Admitting: Primary Care

## 2018-01-03 DIAGNOSIS — Z9109 Other allergy status, other than to drugs and biological substances: Secondary | ICD-10-CM

## 2018-01-03 MED ORDER — CETIRIZINE HCL 10 MG PO TABS *I*
10.0000 mg | ORAL_TABLET | Freq: Every day | ORAL | 1 refills | Status: DC
Start: 2018-01-03 — End: 2018-07-03

## 2018-01-03 NOTE — Telephone Encounter (Signed)
Requested Prescriptions     Pending Prescriptions Disp Refills    cetirizine (ZYRTEC) 10 MG tablet 90 tablet 1     Sig: Take 1 tablet (10 mg total) by mouth daily     Last appt:3/226/18  No Future appt scheduled

## 2018-01-16 ENCOUNTER — Ambulatory Visit: Payer: Medicare (Managed Care) | Attending: Primary Care | Admitting: Primary Care

## 2018-01-16 ENCOUNTER — Encounter: Payer: Self-pay | Admitting: Primary Care

## 2018-01-16 VITALS — BP 108/70 | HR 78 | Temp 98.7°F | Wt 199.8 lb

## 2018-01-16 DIAGNOSIS — H00019 Hordeolum externum unspecified eye, unspecified eyelid: Secondary | ICD-10-CM

## 2018-01-16 LAB — PCMH DEPRESSION ASSESSMENT

## 2018-01-16 NOTE — Progress Notes (Signed)
[facility]    SUBJECTIVE    Jodi Butler is a 47 y.o. female who presents, for Eye Problem (R eye )    Presents to the office with complaints of right eye itching, slight discomfort noted of the upper eyelid with swelling.  Reports that she woke up like this.  Denies any injury or trauma, vision changes or discharge.        I personally reviewed the patient's medications, allergies, medical history and updated as appropriate.      Current Outpatient Prescriptions   Medication Sig    cetirizine (ZYRTEC) 10 MG tablet Take 1 tablet (10 mg total) by mouth daily    DULoxetine (CYMBALTA) 60 MG capsule Take 1 capsule (60 mg total) by mouth daily    COPAXONE 40 mg/mL prefilled syringe INJECT 1 ML (40 MG TOTAL) INTO THE SKIN THREE TIMES A WEEK    buPROPion (WELLBUTRIN XL) 300 MG 24 hr tablet Take 1 tablet (300 mg total) by mouth every morning   Swallow whole. Do not crush, break, or chew.    predniSONE (DELTASONE) 20 MG tablet Take 1 tablet (20 mg total) by mouth daily    tiZANidine (ZANAFLEX) 4 MG tablet Take 1 tablet (4 mg total) by mouth every 8 hours as needed for Muscle spasms    carbamide peroxide (DEBROX) 6.5 % otic solution Place in ear four times daily 24h prior to appts for removal    nystatin (NYSTOP) 100000 UNIT/GM powder Apply topically 2 times daily as needed for Itching    ibuprofen (ADVIL,MOTRIN) 200 MG tablet Take 400 mg by mouth 2 times daily as needed for Pain       ROS  See pertinent HPI      OBJECTIVE  Vitals:    01/16/18 1008   BP: 108/70   Pulse: 78   Temp: 37.1 C (98.7 F)   TempSrc: Temporal   SpO2: 99%   Weight: 90.6 kg (199 lb 12.8 oz)     Body mass index is 37.75 kg/m.      GENERAL: A&Ox3, pleasant, cooperative, well groomed, appropriately dressed, actively engaged in encounter, NAD.  EYES: PERRLA. EOMI. Conjunctiva pink, Right upper lid with mild edema noted and pain with palpation.  Small area of firmness noted.   ENT: TMs and canals clear bilat, nasal mucosa clear,  oropharynx moist with no oropharyngeal edema or erythema present; no oral lesions, exudate, or tonsillar enlargement. No frontal or maxillary sinus discomfort or pain with palpation.   NECK: Trachea midline. Thyroid nontender, without nodularity or enlargement.   LYMPH: No cervical or supraclavicular lymphadenopathy.  LUNGS: Normal resp effort. CTAB.  No adventitious breath sounds noted.  Full and symmetric lung expansion.  CARDIAC: RRR, S1 S2, no murmers, gallops, rubs.  No pedal edema. Pulses 2+ b/l.     PSYCH: AAOx3, normal affect and mood. Insight and judgement intact.         ASSESSMENT & PLAN    1. Sty    Patient Instructions   1. Apply warm compresses or hot tea bags to affected eye lid.  2. Tylenol if needed pain.  3. Educated patient on when to seek follow up treatment.  4. Follow up if needed.    Patient Education   Stye   WHAT YOU NEED TO KNOW:   A stye is a lump on the edge or inside of your eyelid caused by an infection. A stye can form on your upper or lower eyelid. It usually  goes away in 2 to 4 days.  DISCHARGE INSTRUCTIONS:   Medicines:    Antibiotic medicine:  This is given as an ointment to put into your eye. It is used to fight an infection caused by bacteria. Use as directed.      Take your medicine as directed.  Contact your healthcare provider if you think your medicine is not helping or if you have side effects. Tell him of her if you are allergic to any medicine. Keep a list of the medicines, vitamins, and herbs you take. Include the amounts, and when and why you take them. Bring the list or the pill bottles to follow-up visits. Carry your medicine list with you in case of an emergency.    Follow up with your healthcare provider as directed:  Write down your questions so you remember to ask them during your visits.   Self-care:    Use warm compresses:  This will help decrease swelling and pain. Wet a clean washcloth with warm water and place it on your eye for 10 to 15 minutes, 3 to 4  times each day or as directed.     Keep your hands away from your eye:  This helps to prevent the spread of the infection to other parts of the eye. Wash your hands often with soap and dry with a clean towel. Do not squeeze the stye.      Do not use eye makeup:  Do not wear eye makeup while you have a stye. Eye makeup may carry bacteria and cause another stye. Throw away eye makeup and brushes used to apply the makeup. Use new eye makeup after the stye has gone away. Do not share eye makeup with others.     Prevent another stye:  Wash your face and clean your eyelashes every day. Remove eye makeup with makeup remover. This helps to completely remove eye makeup without heavy rubbing.    Contact your healthcare provider if:    You have redness and discharge around your eye, and your eye pain is getting worse.     Your vision changes.     The stye has not gone away within 7 days.      The stye comes back within a short period of time after treatment.     You have questions or concerns about your condition or care.     Copyright [employer] 2019 Information is for [employer] use only and may not be sold, redistributed or otherwise used for commercial purposes. All illustrations and images included in CareNotes are the copyrighted property of A.D.A[employer]M., Inc. or [employer] [facility] [facility] [employer] [facility]  The above information is an educational aid only. It is not intended as medical advice for individual conditions or treatments. Talk to your doctor, nurse or pharmacist before following any medical regimen to see if it is safe and effective for you.           Follow-up: Return if symptoms worsen or fail to improve.      Patient verbalized understanding of information presented,and confirmed agreement with current plan of care.  Reviewed risks, benefits, and administration of prescribed/refilled medications. Precautions reviewed.      Lora Havens, FNP-C  01/16/2018  11:34 AM

## 2018-01-16 NOTE — Patient Instructions (Addendum)
1. Apply warm compresses or hot tea bags to affected eye lid.  2. Tylenol if needed pain.  3. Educated patient on when to seek follow up treatment.  4. Follow up if needed.    Patient Education   Stye   WHAT YOU NEED TO KNOW:   A stye is a lump on the edge or inside of your eyelid caused by an infection. A stye can form on your upper or lower eyelid. It usually goes away in 2 to 4 days.  DISCHARGE INSTRUCTIONS:   Medicines:    Antibiotic medicine:  This is given as an ointment to put into your eye. It is used to fight an infection caused by bacteria. Use as directed.      Take your medicine as directed.  Contact your healthcare provider if you think your medicine is not helping or if you have side effects. Tell him of her if you are allergic to any medicine. Keep a list of the medicines, vitamins, and herbs you take. Include the amounts, and when and why you take them. Bring the list or the pill bottles to follow-up visits. Carry your medicine list with you in case of an emergency.    Follow up with your healthcare provider as directed:  Write down your questions so you remember to ask them during your visits.   Self-care:    Use warm compresses:  This will help decrease swelling and pain. Wet a clean washcloth with warm water and place it on your eye for 10 to 15 minutes, 3 to 4 times each day or as directed.     Keep your hands away from your eye:  This helps to prevent the spread of the infection to other parts of the eye. Wash your hands often with soap and dry with a clean towel. Do not squeeze the stye.      Do not use eye makeup:  Do not wear eye makeup while you have a stye. Eye makeup may carry bacteria and cause another stye. Throw away eye makeup and brushes used to apply the makeup. Use new eye makeup after the stye has gone away. Do not share eye makeup with others.     Prevent another stye:  Wash your face and clean your eyelashes every day. Remove eye makeup with makeup remover. This helps to  completely remove eye makeup without heavy rubbing.    Contact your healthcare provider if:    You have redness and discharge around your eye, and your eye pain is getting worse.     Your vision changes.     The stye has not gone away within 7 days.      The stye comes back within a short period of time after treatment.     You have questions or concerns about your condition or care.     Copyright [employer] 2019 Information is for [employer] use only and may not be sold, redistributed or otherwise used for commercial purposes. All illustrations and images included in CareNotes are the copyrighted property of [employer] or [employer] [facility]  The above information is an educational aid only. It is not intended as medical advice for individual conditions or treatments. Talk to your doctor, nurse or pharmacist before following any medical regimen to see if it is safe and effective for you.

## 2018-02-16 ENCOUNTER — Other Ambulatory Visit: Payer: Self-pay | Admitting: Primary Care

## 2018-02-16 DIAGNOSIS — F32A Depression, unspecified: Secondary | ICD-10-CM

## 2018-02-17 ENCOUNTER — Ambulatory Visit: Payer: Medicare (Managed Care) | Attending: Primary Care | Admitting: Primary Care

## 2018-02-17 ENCOUNTER — Encounter: Payer: Self-pay | Admitting: Primary Care

## 2018-02-17 VITALS — BP 120/64 | HR 88 | Temp 97.9°F | Wt 199.4 lb

## 2018-02-17 DIAGNOSIS — H6692 Otitis media, unspecified, left ear: Secondary | ICD-10-CM

## 2018-02-17 DIAGNOSIS — J069 Acute upper respiratory infection, unspecified: Secondary | ICD-10-CM

## 2018-02-17 LAB — POCT AMBULATORY RAPID STREP
Lot #: 181212
Rapid Strep Group A Throat-POC: NEGATIVE

## 2018-02-17 MED ORDER — AMOXICILLIN 500 MG PO CAPS *I*
500.0000 mg | ORAL_CAPSULE | Freq: Three times a day (TID) | ORAL | 0 refills | Status: AC
Start: 2018-02-17 — End: 2018-02-24

## 2018-02-17 NOTE — Progress Notes (Signed)
[facility]    SUBJECTIVE    Pt is here to discuss:    Chief Complaint   Patient presents with    Cough    Nasal Congestion    Otalgia     blood in ear     Pharyngitis     1. Cough, sinus congestion, otalgia, pharyngitis - x ~6d. "Her ears are so plugged, she can't hear anything." She had blood that came out of her L ear, noticed it on her pillow when she woke up. Her cough is productive with green sputum. She is feeling mildly SOB. She has rhinorrhea and sinus pressure, which is worse when laying down. Her wife, her wife's father, and pt's mother have also been sick. Wife had strep, otitis, and bronchitis. Wife's father had a bad productive cough. Pt has tried guaifenesin for relief.       PMH / Family Hx / Social Hx  Patient's medications, allergies, problem list, past medical, social histories were reviewed and notable for:    Current Outpatient Prescriptions   Medication Sig    buPROPion (WELLBUTRIN XL) 300 MG 24 hr tablet TAKE 1 TABLET BY MOUTH EVERY MORNING SWALLOW WHOLE, DO NOT BREAK, CRUSH OR CHEW    amoxicillin (AMOXIL) 500 MG capsule Take 1 capsule (500 mg total) by mouth 3 times daily for 7 days    cetirizine (ZYRTEC) 10 MG tablet Take 1 tablet (10 mg total) by mouth daily    predniSONE (DELTASONE) 20 MG tablet Take 1 tablet (20 mg total) by mouth daily    DULoxetine (CYMBALTA) 60 MG capsule Take 1 capsule (60 mg total) by mouth daily    COPAXONE 40 mg/mL prefilled syringe INJECT 1 ML (40 MG TOTAL) INTO THE SKIN THREE TIMES A WEEK    tiZANidine (ZANAFLEX) 4 MG tablet Take 1 tablet (4 mg total) by mouth every 8 hours as needed for Muscle spasms    carbamide peroxide (DEBROX) 6.5 % otic solution Place in ear four times daily 24h prior to appts for removal    nystatin (NYSTOP) 100000 UNIT/GM powder Apply topically 2 times daily as needed for Itching    ibuprofen (ADVIL,MOTRIN) 200 MG tablet Take 400 mg by mouth 2 times daily as needed for Pain       ROS  No fevers or  chills      OBJECTIVE  Vitals:    02/17/18 1504   BP: 120/64   Pulse: 88   Temp: 36.6 C (97.9 F)   TempSrc: Temporal   SpO2: 99%   Weight: 90.4 kg (199 lb 6.4 oz)     Body mass index is 37.68 kg/m.      General: well-appearing obese Caucasian female, pleasant & conversant, in NAD. Hard or hearing. Here with wife.   Eyes: PERRLA. EOMI. Conjunctiva pink, without swelling or exudate. Lids normal.   ENT: Moist mucous membranes. No tonsillar, palatal, or buccal lesions. Normal lips and dentition. R TM obscured by cerumen, removed by curette, TM grey and normal LR. L TM opaque and retracted. Nasal turbinates non swollen and no mucus visualized in nares. Sinuses non tender to palpation.   Neck: Trachea midline. Thyroid nontender, without nodularity or enlargement.   Lymph: No cervical or supraclavicular lymphadenopathy.  Lungs: Normal resp effort. CTAB.  No crackles or wheezes.  Cardiac: RRR, no M/R/G.     Psych: AAOx3, normal affect and mood. Insight and judgement intact.     Rapid strep neg    ASSESSMENT & PLAN  1. Otitis media, left  2. Viral uri  Amoxicillin x 7d ordered today for otitis media. Recommended conservative care with rest, fluids, NSAIDs/APAP, honey (in tea or throat lozenges). Confirmed that sudafed and ibuprofen are safe to use.   - amoxicillin (AMOXIL) 500 MG capsule; Take 1 capsule (500 mg total) by mouth 3 times daily for 7 days  Dispense: 21 capsule; Refill: 0        Follow-up: PRN      Farrell Ours, MD  [facility]  02/17/2018  3:26 PM      I Jodi Butler, am scribing for and in the presence of Dr. Farrell Ours. 02/17/2018 3:26 PM.    I, Dr. Farrell Ours, MD, personally performed the services described in this documentation, as scribed by Jodi Butler in my presence, and it is accurate and complete.  I have reviewed, added to, and completed this documentation personally and assure that it is complete and accurate. 02/17/2018 4:23 PM        ______________________

## 2018-02-17 NOTE — Telephone Encounter (Signed)
Last appt: 01/16/2018  Next appt:  02/17/2018    No flowsheet data found.

## 2018-02-21 ENCOUNTER — Encounter: Payer: Self-pay | Admitting: Primary Care

## 2018-02-21 NOTE — Telephone Encounter (Signed)
Last appt: 02/17/2018  Next appt:  Visit date not found    Recent Lab Values 02/17/2018   EXP DATE 04/06/18

## 2018-02-22 MED ORDER — FLUTICASONE PROPIONATE 50 MCG/ACT NA SUSP *I*
1.0000 | Freq: Every day | NASAL | 5 refills | Status: DC
Start: 2018-02-22 — End: 2018-06-15

## 2018-02-28 ENCOUNTER — Ambulatory Visit: Payer: Medicare (Managed Care) | Attending: Primary Care | Admitting: Primary Care

## 2018-02-28 ENCOUNTER — Encounter: Payer: Self-pay | Admitting: Primary Care

## 2018-02-28 VITALS — BP 120/72 | HR 94 | Temp 97.1°F | Ht 60.98 in | Wt 195.0 lb

## 2018-02-28 DIAGNOSIS — H6983 Other specified disorders of Eustachian tube, bilateral: Secondary | ICD-10-CM

## 2018-02-28 DIAGNOSIS — H9190 Unspecified hearing loss, unspecified ear: Secondary | ICD-10-CM

## 2018-02-28 NOTE — Progress Notes (Signed)
[facility]    SUBJECTIVE    Pt is here to discuss:    Chief Complaint   Patient presents with    Ear Fullness     pt reports that she still can not hear out of her ears, wants to know if maybe a referral to ENT would be helpful         1. Hearing difficulty - persistent despite treatment for L AOM 9/13 by our office. Has tried flonase as well without relief. Describes muffled hearing that alternates side to side, crackling sound and ringing. Denies vertigo. Had URI sx as well on 9/13 which have improved but not resolved      PMH / Family Hx / Social Hx  Patient's medications, allergies, problem list, past medical, social histories were reviewed and notable for:    H/o mult sclerosis    Current Outpatient Prescriptions   Medication Sig    fluticasone (FLONASE) 50 MCG/ACT nasal spray 1 spray by Nasal route daily    buPROPion (WELLBUTRIN XL) 300 MG 24 hr tablet TAKE 1 TABLET BY MOUTH EVERY MORNING SWALLOW WHOLE, DO NOT BREAK, CRUSH OR CHEW    cetirizine (ZYRTEC) 10 MG tablet Take 1 tablet (10 mg total) by mouth daily    DULoxetine (CYMBALTA) 60 MG capsule Take 1 capsule (60 mg total) by mouth daily    COPAXONE 40 mg/mL prefilled syringe INJECT 1 ML (40 MG TOTAL) INTO THE SKIN THREE TIMES A WEEK    predniSONE (DELTASONE) 20 MG tablet Take 1 tablet (20 mg total) by mouth daily    tiZANidine (ZANAFLEX) 4 MG tablet Take 1 tablet (4 mg total) by mouth every 8 hours as needed for Muscle spasms    carbamide peroxide (DEBROX) 6.5 % otic solution Place in ear four times daily 24h prior to appts for removal    nystatin (NYSTOP) 100000 UNIT/GM powder Apply topically 2 times daily as needed for Itching    ibuprofen (ADVIL,MOTRIN) 200 MG tablet Take 400 mg by mouth 2 times daily as needed for Pain       ROS  Denise vision changes or vertigo  Following last visit had sticky drainage from RIGHT side, dry crusty drainage from L      OBJECTIVE  Vitals:    02/28/18 1446   BP: 120/72   BP Location: Left arm    Patient Position: Sitting   Pulse: 94   Temp: 36.2 C (97.1 F)   SpO2: 98%   Weight: 88.5 kg (195 lb)   Height: 1.549 m (5' 0.98")     Body mass index is 36.86 kg/m.      General: well-appearing Caucasian female, pleasant & conversant, in NAD  Eyes:. PERRLA. EOMI. Conjunctiva pink, without swelling or exudate. Lids normal.  ENT: Moist mucous membranes. Normal lips and dentition. Turbinates with swelling R>L, no mucus. No sinus tenderness. R TMs grey, without bulging or erythema. L TM partially obscured by dark black cerumen which was tampered down with curette but not removed. External canal suffered abrasion during curette with moderate bleeding  Neck: Trachea midline. Thyroid nontender, without nodularity or enlargement.   Lymph: No cervical or supraclavicular lymphadenopathy.  Lungs: Normal resp effort. CTAB.  No crackles or wheezes.  Cardiac: RRR, no M/R/G. No pedal edema. Pulses 2+ b/l.     Psych: AAOx3, normal affect and mood. Insight and judgement intact.   Neuro: Weber does not lateralize, but admits to faint sound during [facility], also faint sound to [facility] during  Rhinne        ASSESSMENT & PLAN  1. Hearing loss  2. ETD (Eustachian tube dysfunction), bilateral  - AMB REFERRAL TO AUDIOLOGY        Suspect ETD b/l from preceding URI. Previous AOM has cleared since her last visit. Reviewed that ETD may last 6-8 weeks, recommended use of Otovent balloon device (given info on availability on [employer]). Given her underyling history of mult sclerosis and degree of hearing difficulty, will get audiology testing.         Follow-up: as otherwise scheduled      Farrell Ours, MD  [facility]  02/28/2018  3:14 PM        ______________________

## 2018-02-28 NOTE — Patient Instructions (Signed)
[email or web address].otovent.co.uk/buy-otovent/[email or web address]    [email or web address].[email or web address]/glue-[email or web address]    [email or web address]

## 2018-03-31 ENCOUNTER — Ambulatory Visit: Payer: Medicare (Managed Care) | Attending: Primary Care | Admitting: Audiology

## 2018-03-31 DIAGNOSIS — H903 Sensorineural hearing loss, bilateral: Secondary | ICD-10-CM

## 2018-03-31 DIAGNOSIS — H905 Unspecified sensorineural hearing loss: Secondary | ICD-10-CM | POA: Insufficient documentation

## 2018-03-31 NOTE — Progress Notes (Signed)
AUDIOLOGIC EVALUATION     Audiology  Part of [facility]   [address] [place], [address]  [place],  [place] [address]  Phone: [phone], Fax: [phone]     Outpatient Visit  Patient: Jodi Butler   MR Number: [id number]   Date of Birth: 30-Jun-1970   Date of Visit: 03/31/2018      PURE-TONE TEST RESULTS  Type of Testing: conventional      Test Reliability: good  Transducer: headphone  Booth: 2  ANSI S3.21.2004 (R2009)     Air Conduction Testing (dB HL and kHz)     LEFT EAR RIGHT EAR     0.125 0.25 0.50  0.75 1.0 1.5 2.0 3.0 4.0 6.0 8.0  0.125 0.25 0.50 0.75 1.0 1.5 2.0 3.0 4.0 6.0 8.0     45 25   10 30  40   35   25      35 15   10 25 30   25   20                                                                                                           Effective Masking Level                                                      Bone Conduction Testing (dB HL and kHz)  Bone conduction was masked when appropriate      0.25 0.50  0.75 1.0 1.5 2.0 3.0 4.0     0.25 0.50 0.75 1.0 1.5 2.0 3.0 4.0      40 30   15   40   30     35 15           25                                                                                                       Effective Masking Level    65 45           55                            SPEECH AUDIOMETRY     SAT SRT Score dB HL EML  Test  SAT SRT Score dB HL EML  Test       100 % 70 40 W-22 CD      100 % 60 30  W-22 CD   EML   EML            EML   EML               Please refer to scanned [facility] [id number] MR for additional information     Notes  Threshold in dB HL  Frequency in kiloHertz (kHz) Legend   dB=decibels  HL=Hearing Level  NR=No Response  VT=Vibro-Tactile EML=Effective Masking Level  SAT=Speech Awareness Threshold  SRT=Speech Reception  Threshold  MLV=Monitored Live Voice  CD=Compact Disk     HISTORY: GURSEERAT STIVES was referred by Dr. Farrell Ours for an audiological evaluation to determine whether patient is a candidate for medical,  surgical or rehabilitative services. Puneet was accompanied to today's appointment by her wife. Patient reports having a plugged sensation in her ears, right more than left. A viral infection reportedly preceded her symptoms of ear fullness. She notes that she is routinely seen for cerumen removal at her physician's office. At her last visit the cerumen was reportedly partially removed from the left ear canal. Medical history is also significant for a diagnosis of multiple sclerosis.        FINDINGS: Pure-tone test results indicate a mild sensorineural hearing loss with a region of normal hearing sensitivity at 1000 Hz, left ear, and a mild sensorineural hearing loss at 250 Hz rising to normal hearing sensitivity through 1000 Hz, sloping to a mild sensorineural hearing loss that rises again to normal hearing sensitivity at 8000 Hz, right ear. There is a 10 dB HL asymmetry (left ear poorer than right ear) at four test frequencies. Speech recognition ability in quiet was excellent, left ear, when speech was presented at a loud level and excellent, right ear, when speech was presented at a loud conversational level.     Otoscopy indicated excessive cerumen, left ear, and a clear canal, right ear. The left tympanic membrane could not be fully visualized due to the presence of cerumen.      ACOUSTIC IMMITTANCE:  TYMPANOMETRY:    Right Ear: Tested Left Ear: Tested   Frequency: 226 Hz  Frequency: 226 Hz   Canal Volume (ml): 1.4 Canal Volume (ml): 1.4   Static Compliance Peak (ml): 0.4 Static Compliance Peak (ml): 0.2   Peak Pressure (daPa): -20 Peak Pressure (daPa): -40   Gradient (daPa): 90 Gradient (daPa): 80      Normal ear canal volume, peak pressure, and static compliance were measured, consistent with normal eustachian tube function in the right ear. Normal ear canal volume, peak pressure, and reduced static compliance were measured, consistent with abnormal results in the left ear.     Today's test results were  reviewed with the patient. May wish to consider further site-of-lesion testing and/or referral to ENT to rule out retrocochlear pathology given asymmetrical sensorineural hearing loss if warranted/not previous pursued. May wish to return in one year to monitor stability of hearing thresholds.     While patient may benefit from amplification in her left ear, pending medical clearance, she does not meet candidacy criteria for amplification through her insurance.     RECOMMENDATIONS: Audiologic re-evaluation as per Dr. Christell Constant.        Jenene Slicker, AuD, Landscape architect  [facility]

## 2018-04-01 ENCOUNTER — Telehealth: Payer: Self-pay | Admitting: Primary Care

## 2018-04-01 DIAGNOSIS — H903 Sensorineural hearing loss, bilateral: Secondary | ICD-10-CM

## 2018-04-01 NOTE — Telephone Encounter (Signed)
Please help with referral (urgent)    Farrell Ours, MD  [facility]  05/12/2017  11:06 AM

## 2018-04-03 ENCOUNTER — Telehealth: Payer: Self-pay | Admitting: Primary Care

## 2018-04-03 NOTE — Telephone Encounter (Signed)
Received a call from the patient's PCP office for the patient to be seen for hearing loss. Audiogram was done with our office on 10/25 and there is an office note on file from the PCP on 09/24. The patient's PCP would like the patient to be seen with an otologist sooner than first available. Please advise when this patient should be seen. Thank you.

## 2018-04-03 NOTE — Telephone Encounter (Signed)
I called called [facility] and spoke with Laura([phone]). She stated the next available appointment is booking 1 month out. I informed her that the referral is marked as urgent. She states she will place the referral for nurse review and contact patient with an appointment.

## 2018-04-03 NOTE — Telephone Encounter (Signed)
LVM for patient/parent to call and schedule an appointment.

## 2018-04-03 NOTE — Telephone Encounter (Signed)
Routing to ENT staff to offer appointment.

## 2018-04-06 ENCOUNTER — Encounter: Payer: Self-pay | Admitting: Primary Care

## 2018-04-06 ENCOUNTER — Encounter: Payer: Self-pay | Admitting: Otolaryngology

## 2018-04-06 ENCOUNTER — Ambulatory Visit: Payer: Medicare (Managed Care) | Attending: Otolaryngology | Admitting: Otolaryngology

## 2018-04-06 VITALS — BP 133/78 | HR 84 | Ht 60.98 in | Wt 196.8 lb

## 2018-04-06 DIAGNOSIS — H9190 Unspecified hearing loss, unspecified ear: Secondary | ICD-10-CM

## 2018-04-06 DIAGNOSIS — Z1239 Encounter for other screening for malignant neoplasm of breast: Secondary | ICD-10-CM

## 2018-04-06 NOTE — Progress Notes (Signed)
Patient seen on 04/06/18 at 1:42 PM    HPI  CC: asymmetric hearing loss    Context: Jodi Butler is a 47 y.o. female who with her wife presents for asymmetrical hearing loss, left ear worse. Pt had an audiogram 03/31/18. There was some cerumen in her left ear that may have caused the asymmetrical hearing. Prior to this pt reports she had a virus where her ears were plugged, felt like she was under water (R>L). Says she was unable to pop her ears. The day she noticed this feeling she had some bloody drainage from her left ear. To this day her ears still occasionally "crackle." She does not hear better after this occurs as it plugs right back up after. Her PCP cleans her ears regurarly. No clenching or grinding of her teeth. Pt's headaches are frontal in nature. Hx of multiple sclerosis.      She has no other complaints today.    HISTORY  Patient's past medical and surgical history, medications, allergies, and social history were reviewed. Pertinent history has been included in the HPI.  Past Medical: She  has a past medical history of Anxiety (2004), Depression, and MS (multiple sclerosis).  Past Surgical: She  has a past surgical history that includes Appendectomy.  Past Social: She  reports that she quit smoking about 21 years ago. Her smoking use included cigarettes. She has a 5.00 pack-year smoking history. She has never used smokeless tobacco. She reports current alcohol use. She reports being sexually active and has had partner(s) who are Female. She reports using the following method of birth control/protection: None. She reports that she does not use drugs.  Family History: Her family history includes Alzheimer's disease in her maternal grandmother; Anxiety disorder in her maternal aunt; Arthritis in her mother; Bleeding problems in her maternal aunt; Blood clots in her maternal aunt; Breast cancer in her paternal grandmother; Breast cancer (age of onset: 12) in her paternal aunt; Cancer in her paternal  aunt, paternal grandfather, paternal grandmother, and paternal uncle; Depression in her maternal aunt; Diabetes in her paternal grandmother; Hypertension in her mother; Lung cancer in her paternal uncle; Multiple Sclerosis in her father; Stroke in her maternal grandmother.    REVIEW OF SYSTEMS  Review of Systems   HENT: Positive for ear discharge and hearing loss.    Neurological: Positive for headaches. Negative for dizziness.       PHYSICAL EXAM  Vitals:    04/06/18 1338   BP: 133/78   Pulse: 84   Weight: 89.3 kg (196 lb 12.8 oz)   Height: 1.549 m (5' 0.98")       Constitutional Exam: She is pleasant and cooperative.  Well-developed and well-nourished.  No apparent distress.  Vital signs reviewed in electronic record.    Exam of Ears   Inspection:  Right: Normal pinna. Normal canal. Healthy, intact TM.  Left: Normal pinna. Some cerumen and dry skin debris, otherwise normal canal. Healthy, intact TM.     Pneumatic Otoscopy: Mobile b/l   Palpation: No mastoid tenderness.     Exam of Nose:   Inspection: Septum fairly midline. Mucosa looks dry. Normal turbinates. No polyps or lesions. No prominent vascularity.    Exam of Oral Cavity/Oropharynx:   Inspection: Normal dental wear for age. Normal appearing mucosal surfaces. Tongue midline. No lesions. Normal uvula and soft palate. Uvula midline. Normal oropharynx. TMJ NTTP bilaterally, no crepitus.     Exam of Neck:   Inspection: No gross masses. Supple.  Symmetric in appearance.   Palpation: No lymphadenopathy. No masses. No thyromegaly. Salivary glands soft.    Neurological Exam:  Normal speech. Facial nerve function intact.    EXTERNAL STUDIES  The following studies were personally reviewed by myself.  Audiogram 03/31/18:  "Mild SNHL with region of normal hearing at 1000 Hz, left ear  Mild SNHL at 250 Hz rising to normal hearing through 1000 Hz, sloping to mild SNHL that rises to normal hearing at 8000 Hz, right ear  10 dB hearing loss asymmetry at four test  frequencies, left poorer then right"    ASSESSMENT AND PLAN  No diagnosis found.     Jodi Butler is a 47 y.o. female who presented for asymmetrical hearing loss, left ear worse.   Reviewed pt's recent audiogram results with her during today's appt.     Cerumen and dry skin debris was removed from the left canal under microscope using proper instrumentation.   No signs of fluid or infection.   Suspect "crackling" sound is due to ETD secondary to her recent virus infection.    Recommended regular saline rinses.  Gave pt a handout.     I invited and answered all questions to the patient's satisfaction.   She will follow up as needed.    the asymmetry is extremely mild, and does not warrant retrocochlear workup at this time    AUTHORS  Attestations:  I, Genice Rouge, am scribing for and in the presence of Dr. Zenovia Jarred on 04/06/18 at 1:42 PM.      All medical record entries made by the scribe were at my direction and personally dictated by me.  I have reviewed the chart and agree that the the record accurately reflects my personal performance of the history, physical examination, assessment and plan.  I have also personally directed, reviewed, and agree with the discharge instructions    With best personal regards,    Emilio Aspen, M.D.  Associate Professor  Otology/Neurotology  Department of Otolaryngology    Dictated with voice recognition software, not read.

## 2018-04-07 NOTE — Telephone Encounter (Signed)
The patient was seen on 04/06/18 by ENT.

## 2018-04-10 NOTE — Telephone Encounter (Signed)
.  Please help with mammogram    Farrell Ours, MD  [facility]  05/12/2017  11:06 AM

## 2018-04-26 ENCOUNTER — Other Ambulatory Visit: Payer: Self-pay | Admitting: Primary Care

## 2018-04-26 MED ORDER — TIZANIDINE HCL 4 MG PO TABS *I*
4.0000 mg | ORAL_TABLET | Freq: Three times a day (TID) | ORAL | 5 refills | Status: DC | PRN
Start: 2018-04-26 — End: 2018-11-01

## 2018-04-26 NOTE — Telephone Encounter (Signed)
Last appt: 02/28/2018  Next appt:  Visit date not found    Recent Lab Values 02/17/2018   EXP DATE 04/06/18

## 2018-05-16 ENCOUNTER — Encounter: Payer: Self-pay | Admitting: Primary Care

## 2018-05-16 NOTE — Telephone Encounter (Signed)
Please help with mammo order, send to [facility]    Farrell Ours, MD  [facility]  05/16/2018  4:35 PM

## 2018-05-18 ENCOUNTER — Encounter: Payer: Self-pay | Admitting: Gastroenterology

## 2018-05-29 ENCOUNTER — Telehealth: Payer: Self-pay | Admitting: Primary Care

## 2018-05-29 DIAGNOSIS — Z1239 Encounter for other screening for malignant neoplasm of breast: Secondary | ICD-10-CM

## 2018-05-29 NOTE — Telephone Encounter (Signed)
Pt needs appt for CPE/AGY (due in March). Should also be labeled AWV  , please    Please schedule  Please also remind her she's due for mammogram and help schedule this (order in)      Farrell Ours, MD  [facility]  05/29/2018  6:58 AM

## 2018-06-02 NOTE — Telephone Encounter (Signed)
Called and spoke with patient. She stated she is out and will call back to schedule her phy for March and also stated she is already scheduled for her Mammo at [facility] at the end of January.

## 2018-06-15 ENCOUNTER — Other Ambulatory Visit
Admission: RE | Admit: 2018-06-15 | Discharge: 2018-06-15 | Disposition: A | Discharge status: Home / Self Care | Payer: Medicare (Managed Care) | Source: Ambulatory Visit

## 2018-06-15 ENCOUNTER — Encounter: Payer: Self-pay | Admitting: Primary Care

## 2018-06-15 ENCOUNTER — Ambulatory Visit: Payer: Medicare (Managed Care) | Attending: Primary Care | Admitting: Primary Care

## 2018-06-15 VITALS — BP 120/80 | HR 82 | Temp 96.8°F | Ht 60.98 in | Wt 191.0 lb

## 2018-06-15 DIAGNOSIS — Z01419 Encounter for gynecological examination (general) (routine) without abnormal findings: Secondary | ICD-10-CM | POA: Insufficient documentation

## 2018-06-15 DIAGNOSIS — G35 Multiple sclerosis: Secondary | ICD-10-CM | POA: Insufficient documentation

## 2018-06-15 DIAGNOSIS — R32 Unspecified urinary incontinence: Secondary | ICD-10-CM | POA: Insufficient documentation

## 2018-06-15 DIAGNOSIS — L301 Dyshidrosis [pompholyx]: Secondary | ICD-10-CM

## 2018-06-15 LAB — CBC
Hematocrit: 44 % (ref 34–45)
Hemoglobin: 14.1 g/dL (ref 11.2–15.7)
MCH: 30 pg/cell (ref 26–32)
MCHC: 32 g/dL (ref 32–36)
MCV: 95 fL (ref 79–95)
Platelets: 248 10*3/uL (ref 160–370)
RBC: 4.7 MIL/uL (ref 3.9–5.2)
RDW: 12.5 % (ref 11.7–14.4)
WBC: 4.3 10*3/uL (ref 4.0–10.0)

## 2018-06-15 LAB — COMPREHENSIVE METABOLIC PANEL
ALT: 24 U/L (ref 0–35)
AST: 19 U/L (ref 0–35)
Albumin: 4.7 g/dL (ref 3.5–5.2)
Alk Phos: 81 U/L (ref 35–105)
Anion Gap: 11 (ref 7–16)
Bilirubin,Total: 0.5 mg/dL (ref 0.0–1.2)
CO2: 27 mmol/L (ref 20–28)
Calcium: 9.7 mg/dL (ref 8.6–10.2)
Chloride: 100 mmol/L (ref 96–108)
Creatinine: 0.89 mg/dL (ref 0.51–0.95)
GFR,Black: 90 *
GFR,Caucasian: 78 *
Glucose: 78 mg/dL (ref 60–99)
Lab: 9 mg/dL (ref 6–20)
Potassium: 4.4 mmol/L (ref 3.3–5.1)
Sodium: 138 mmol/L (ref 133–145)
Total Protein: 7.1 g/dL (ref 6.3–7.7)

## 2018-06-15 LAB — LIPID PANEL
Chol/HDL Ratio: 3.1
Cholesterol: 227 mg/dL — AB
HDL: 74 mg/dL — ABNORMAL HIGH (ref 40–60)
LDL Calculated: 142 mg/dL — AB
Non HDL Cholesterol: 153 mg/dL
Triglycerides: 56 mg/dL

## 2018-06-15 MED ORDER — TRIAMCINOLONE ACETONIDE 0.1 % EX OINT *I*
TOPICAL_OINTMENT | Freq: Two times a day (BID) | CUTANEOUS | 1 refills | Status: DC
Start: 2018-06-15 — End: 2018-09-04

## 2018-06-15 NOTE — H&P (Signed)
[facility]: Annual Gyn Exam    HPI  Jodi Butler is a 48 y.o. ?perimenopausal female who presents for an annual gyn exam. The patient has the following concerns today:    1. Urine dribbling - new for her. Notices urine in her underwear, constant moisture. She wears pads day and night. She denies urge or stress incontinence. Seen by her neurologist recently, referred to uro for management. Thought to be 2/2 Multiple sclerosis.    2. MS, depression - her mood feels stable most days, but she admits her chronic pain can be depressing, and she does have bad days. She has been using marijuana with relief of pain, and is wondering if she can have information about medical marijuana. She is currently getting safe regulated marijuana from [place], currently using it ~every other day PRN. She denies any other drug use.     Her wife voiced concerns about changes in [name]; she's more forgetful (ex. Upcoming plans, medications) and gets angry if reminded. Pt agrees and admits to this too. Denies forgetting bills or names, getting lost     3. Dry hands - the skin on her hands get so dry, the skin cracks and bleeds. She has been using Aveeno lotion.        OB/Gyn History  LMP: Patient's last menstrual period was 06/11/2018.   Menses: regular, monthly, less bleeding  Cramps: takes Motrin PRN for cramping    Last pap smear: Date: 08/06/13  Results: normal, HPV neg  History of abnormal pap smear: N/A    The patient is sexually active.   Sexually active: with wife      OB History   No obstetric history on file.       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      Past Medical History:   Diagnosis Date    Anxiety 2004    Depression     MS (multiple sclerosis)       Past Surgical History:   Procedure Laterality Date     APPENDECTOMY       Current Outpatient Medications   Medication    fluticasone (FLONASE) 50 MCG/ACT nasal spray    tiZANidine (ZANAFLEX) 4 MG tablet    buPROPion (WELLBUTRIN XL) 300 MG 24 hr tablet    cetirizine (ZYRTEC) 10 MG tablet    DULoxetine (CYMBALTA) 60 MG capsule    COPAXONE 40 mg/mL prefilled syringe    nystatin (NYSTOP) 100000 UNIT/GM powder    ibuprofen (ADVIL,MOTRIN) 200 MG tablet    triamcinolone (KENALOG) 0.1 % ointment     No current facility-administered medications for this visit.      Allergies   Allergen Reactions    No Known Drug Allergy      Created by Conversion - 0;      Family History   Problem Relation Age of Onset    Multiple Sclerosis Father     Stroke Maternal Grandmother     Alzheimer's disease Maternal Grandmother     Hypertension Mother     Arthritis Mother     Breast cancer Paternal Aunt 30    Breast cancer Paternal Grandmother     Cancer Paternal Grandmother     Diabetes Paternal Grandmother     Lung cancer Paternal  Uncle     Anxiety disorder Maternal Aunt     Bleeding problems Maternal Aunt     Blood clots Maternal Aunt     Depression Maternal Aunt     Cancer Paternal Aunt     Cancer Paternal Uncle     Cancer Paternal Grandfather      Social History     Socioeconomic History    Marital status: Married     Spouse name: Not on file    Number of children: Not on file    Years of education: Not on file    Highest education level: Not on file   Occupational History    Not on file   Tobacco Use    Smoking status: Former Smoker     Packs/day: 1.00     Years: 5.00     Pack years: 5.00     Types: Cigarettes     Last attempt to quit: 06/07/1996     Years since quitting: 22.0    Smokeless tobacco: Never Used   Substance and Sexual Activity    Alcohol use: Yes     Comment: Only special occasions    Drug use: No    Sexual activity: Yes     Partners: Female     Birth control/protection: None   Social History Narrative    Lives with wife [name] and 2 dogs.  Permanently disabled for multiple sclerosis.          Health Maintenance  Health Maintenance Topics with due status: Overdue       Topic Date Due    Breast Cancer Screening Other 08/31/2017     Health Maintenance Topics with due status: Due Soon       Topic Date Due    Cervical Cancer Screening Other 08/10/2018     Health Maintenance Topics with due status: Not Due       Topic Last Completion Date    DEPRESSION SCREEN YEARLY 01/16/2018     Health Maintenance Topics with due status: Completed       Topic Last Completion Date    IMM-INFLUENZA 04/06/2018     Health Maintenance Topics with due status: Addressed       Topic Date Due    HIV TESTING OFFERED Addressed         ROS  Constitutional--negative for fever and chills,  for recent weight change  +fatigue  Eyes-- negative for blurred vision, redness/irritation  ENMT:   --negative for rhinorrhea or congestion  +intermittent. ear pain  +difficulty hearing greatly improved but residual persists  --negative for mouth lesions/sores  +tooth pain  --negative for ST   Respiratory--negative for cough, wheeze  +SOB while walking, chronic and unchanged  Cardiovascular--negative for palpitations, syncope  +chest pain/pressure, chronic and intermittent, nothing new. Attribute to her "M S hug"  Gastrointestinal--negative for abdominal pain, n/v/c/d, bloody stool  Genitourinary:  --negative for pain with urination  +urine dribbling  +hematuria, resolved  --negative for vaginal discharge, dyspareunia, irregular menses  Heme/Lymph:  --negative for swollen lymph nodes   --negative for easy bleeding  +easy bruising  Skin/Breast--negative for rashes or concerning mole  Psych--negative for substance abuse, SI  +anxiety and depression, managed with medication  Endo:  +polyuria/polydispsia  +heat/cold intolerance  MSK +chronic diffuse arthralgias and myalgias  Neuro--negative for falls  +headaches, dizziness, numbness, weakness - chronic, unchanged  +memory deficit, new for  her      PHYSICAL EXAM  Vitals:    06/15/18 1422  BP: 120/80   Pulse: 82   Temp: 36 C (96.8 F)   Weight: 86.6 kg (191 lb)   Height: 1.549 m (5' 0.98")     Body mass index is 36.11 kg/m.      General: well-appearing obese Caucasian female, pleasant & conversant, in NAD  Psych: normal mood/affect. Recalls and vocalizes plan of care at end of wife's visit 30min later  Eyes: Wearing glasses. PERRLA. EOMI. Conjunctiva pink, without swelling or exudate. Lids normal.   ENT: Moist mucous membranes. Normal lips and dentition. B/L TMs grey, without bulging or erythema.   Neck: Trachea midline. Thyroid nontender, without nodularity or enlargement.   Lymph: No cervical or supraclavicular lymphadenopathy.  Lungs: Normal resp effort. CTAB.  No crackles or wheezes.  Cardiac: RRR, no M/R/G. No pedal edema. Pulses 2+ b/l.     Abd: S/NT. Central obesity. Normoactive BS.  No masses or organomegaly.  No guarding or rebound tenderness.  GU: normal external female genitalia. Discomfort throughout speculum exam. No discharge at vaginal introitus.  Nulliparous cervix, but oly able to visualize ~60% of the inferior aspect.  Mild bleeding noted.  No masses in vaginal vault.  No CMT, no adnexal masses.    Breasts: breasts appear normal, no suspicious masses, no skin or nipple changes or axillary nodes.  Skin: erythematous dry skin on dorsal surface of hands at MCPs. Linear fissuring and cracking      ASSESSMENT  Jodi Butler is a 48 y.o. female here for annual gyn exam.      Other issues that were addressed today include:    1. Well woman exam with routine gynecological exam  - Lipid Panel (Reflex to Direct  LDL if Triglycerides more than 400); Future  - Hemoglobin A1c; Future  - Comprehensive metabolic panel; Future  - CBC; Future  - GYN Cytology; Future  - GYN Cytology    2. Multiple sclerosis  3. Urinary leakage  [facility] for second opinion on M S management and cognitive symptoms.  Also recommended urogyn for consult    - AMB REFERRAL TO NEUROLOGY  - AMB REFERRAL TO OBSTETRICS & GYNECOLOGY    4. Dyshidrotic eczema  Kenalog cream ordered today, instructions provided.        PLAN    Immunizations:   Immunization History   Administered Date(s) Administered    Influenza Quadrivalent 0.505mL prefilled syringe/single dose vial(FluLaval,Fluzone,Afluria,Fluarix) 06/12/2015, 06/14/2016, 04/06/2018    Influenza multi-dose vial 04/23/2013, 05/21/2014       Weight management: Body mass index is 36.11 kg/m.Marland Kitchen.    Discussed the patient's BMI with her. The BMI is above average; BMI management plan is completed.      Sexual health and contraception:   Perimenopause: +hot flashes, no irregular periods. Discussed typical course/symptomatology behind menoapause    Anticipatory/preventative guidance  Tobacco use and cessation counseling if indicated: N/A  Alcohol use: 1-2 drinks monthly  Drug use: Yes, using marijuana q other day PRN for chronic pain. Given info on medical marijuana. reviewed risk of using unregulated marijuana    Routine sunscreen use advised/ skin cancer prevention discussed: Y, skin checked today, ABCDEs of moles discussed  Mammogram/breast self exam (females): due, scheduled in 2 wks  DEXA (females): N/A  Colonoscopy/colon cancer screening: N/A    Exercise: limited by chronic pain. Encouraged to attempt some in home exercises for pain and memory and mood management  Marital/relationship history: lives with wife  Depression screening evaluation performed today: Y  Recent Review Flowsheet  Data     PHQ Scores 06/15/2018 01/16/2018 08/30/2016    PSQ2 Q1 - Interest/Pleasure N N N    PSQ2 Q2 - Down, Depressed, Hopeless N N N    PHQ Q9 - Better Off Dead 0 - -    PHQ Calculated Score 6 - -      Cardiac risk factor modification discussed: Y, updated labs requested  Diabetes prevention and screening discussed: Y, updated labs requested       Follow-up: 57mo or sooner PRN    Signed:  Farrell Ours, MD  [facility]       I [facility], am scribing for and in the presence of Dr. Farrell Ours. 06/15/2018 5:05 PM.    I, Dr. Farrell Ours, MD, personally performed the services described in this documentation, as scribed by Corlis Hove in my presence, and it is accurate and complete.  I have reviewed, added to, and completed this documentation personally and assure that it is complete and accurate. 06/15/2018 9:11 PM

## 2018-06-15 NOTE — Patient Instructions (Addendum)
[email or web address]      MEDICAL MARIJUANA PRESCRIBERS:     Dr. [facility], [facility]  Lequita Asal, MD  [address], [address]  [place], [place] [address]    Medical Marijuana - call [phone]    Medical Marijuana - related visits are not covered by insurance.    In order to become a Medical Marijuana patient:  1.Must have one of the following conditions: chronic pain, cancer, PTSD, amyotrophic lateral sclerosis (ALS), Parkinson's disease, multiple sclerosis, AIDS, damage to the nervous tissue of the spinal cord with objective neurological indication of intractable spasticity, epilepsy, inflammatory bowel disease, neuropathy, or Huntington's disease associated with severe or chronic pain, severe nausea, seizures, or severe or persistent muscle spasms.  2.Must have medical records indicating the qualifying diagnosis, dated within the last 6 months.  3.Must be a resident of [place] with a proof of residency such as a driver's license or other Government - issued photo ID.         Consult [employer] Department of Health web site to make sure your condition qualifies. You will have to ask your current doctors to fax or mail [place] recent treatment notes and test results confirming your condition.    Initial Visit: $250.00. Follow-up: $100.00 ($50 for [employer] veterans). Annual renewal: $150. Fees are collected at the time of visit. Insurance not accepted for this Medical Marijuana visits.    Please note that on the first visit we charge an additional $20 deposit towards your 3 months follow-up. That brings the follow-up cost down to $80. Therefore, the cost of the initial visit is $250 + $20.    -------------------------------------  [facility]  Zip: [id number]  Phone: [phone]  1st visit: $250  Follow up visit: $100  Yearly Renewal: $150    30 min appts.   Open Tues - Fri 9:30 -5:00

## 2018-06-16 LAB — HEMOGLOBIN A1C: Hemoglobin A1C: 5.4 %

## 2018-06-21 ENCOUNTER — Other Ambulatory Visit: Payer: Self-pay | Admitting: Gastroenterology

## 2018-06-22 LAB — GYN CYTOLOGY

## 2018-06-27 ENCOUNTER — Encounter: Payer: Self-pay | Admitting: Primary Care

## 2018-06-27 DIAGNOSIS — M47816 Spondylosis without myelopathy or radiculopathy, lumbar region: Secondary | ICD-10-CM | POA: Insufficient documentation

## 2018-06-28 ENCOUNTER — Other Ambulatory Visit: Payer: Self-pay | Admitting: Gastroenterology

## 2018-07-03 ENCOUNTER — Other Ambulatory Visit: Payer: Self-pay | Admitting: Primary Care

## 2018-07-03 ENCOUNTER — Other Ambulatory Visit: Payer: Self-pay | Admitting: Pulmonary and Critical Care Medicine

## 2018-07-03 DIAGNOSIS — Z9109 Other allergy status, other than to drugs and biological substances: Secondary | ICD-10-CM

## 2018-07-03 DIAGNOSIS — F32A Depression, unspecified: Secondary | ICD-10-CM

## 2018-07-03 NOTE — Telephone Encounter (Signed)
Last appt: 06/15/2018     Next appt:  09/14/2018        Recent Lab Values 02/17/2018   EXP DATE 04/06/18

## 2018-07-04 MED ORDER — DULOXETINE HCL 60 MG PO CPEP *I*
60.0000 mg | DELAYED_RELEASE_CAPSULE | Freq: Every day | ORAL | 3 refills | Status: DC
Start: 2018-07-04 — End: 2019-06-04

## 2018-07-04 MED ORDER — CETIRIZINE HCL 10 MG PO TABS *I*
10.0000 mg | ORAL_TABLET | Freq: Every day | ORAL | 1 refills | Status: DC
Start: 2018-07-04 — End: 2019-01-09

## 2018-07-04 MED ORDER — BUPROPION HCL 300 MG PO TB24 *I*
300.0000 mg | ORAL_TABLET | Freq: Every morning | ORAL | 3 refills | Status: DC
Start: 2018-07-04 — End: 2019-07-23

## 2018-07-04 NOTE — Telephone Encounter (Signed)
Last appt: 06/15/2018     Next appt:  09/14/2018        Recent Lab Values 02/17/2018   EXP DATE 04/06/18

## 2018-07-04 NOTE — Telephone Encounter (Signed)
Last appt: 06/15/2018     Next appt:  07/03/2018        Recent Lab Values 02/17/2018   EXP DATE 04/06/18

## 2018-07-07 ENCOUNTER — Telehealth: Payer: Medicare (Managed Care) | Admitting: Neurology

## 2018-07-07 NOTE — Telephone Encounter (Signed)
Received MRI Spine imaging results from [facility] performed on 06/21/2018. Placing in [employer].

## 2018-07-17 ENCOUNTER — Ambulatory Visit: Payer: Medicare (Managed Care) | Admitting: Neurology

## 2018-07-17 ENCOUNTER — Ambulatory Visit: Payer: Medicare (Managed Care) | Attending: Neurology | Admitting: Neurology

## 2018-07-17 ENCOUNTER — Encounter: Payer: Self-pay | Admitting: Neurology

## 2018-07-17 VITALS — BP 137/81 | HR 78 | Ht 60.0 in | Wt 186.0 lb

## 2018-07-17 DIAGNOSIS — G35 Multiple sclerosis: Secondary | ICD-10-CM | POA: Insufficient documentation

## 2018-07-17 MED ORDER — MIRABEGRON ER 25 MG PO TB24 *I*
25.0000 mg | ORAL_TABLET | Freq: Every day | ORAL | 5 refills | Status: DC
Start: 2018-07-17 — End: 2018-11-28

## 2018-07-17 NOTE — Progress Notes (Signed)
MS Clinic Consultation Note    Referring Provider/Service:     Reason for consult:   MS, reestablish care    History of Present Illness:    I had the pleasure to see Jodi Butler at the [facility] on 07/17/2018, accompanied by her wife.   She is a  47-yo  -handed woman who comes to reestablish care with [place].  She was previously diagnosed with MS by Dr. Adaline Butler and was followed here through 2012.  She subsequently saw Dr. Shea Butler, and, most recently, at [facility].  Notes from Dr. Adaline Butler were reviewed (outlined in below disease summary).    Sh had her first neurologic event in 1998, when she developed binocular horizontaldiplopia and lower motor neuron left facial weakness.  Found to have a single demyelinating lesion in the pons, and LP apparently revealed +OCBs.  Treated with IV steroids, with full recovery in about 1 month.  She did well until 2003-200, when she had an episode of bilateral leg weakness and sensory sx.  She saw Dr. Adaline Butler and imagging reportedly showed new brain lesions typical of MS.  She was sbsequently started on Copaxone in 2004.    She has been on Copaxone nearly continuously since 2004, now at 40 mmg tiw for the past few years.  She has not had any significant exacerbations of new neurologic sx on treatment, though she has been treated intermittently with steroids for periodic worsening of old symptoms (leg numbness/weakness, facial drooping).  States that her imaging has been stable during this interval.  She has been compliant with Copaxone, with mild ISRs and only 2 post-injection vasomotor episodes.    She and her wife do endorse that Jodi Butler has developed cognitive sx over the past several years (perhaps as far back as 10+ years). She has short-term memory difficulty, trouble with attention and focusing, as well as reduced multitasking.  In this context she has had significant late-day fatigue (on amantadine, with benefit), but she also has daytime somnolence.   She is on  tizanidine for spasticity/leg spasms, which she  usually takes at bedtime.  However, sleep is interrupted by nocturia, and her wife reports that Jodi Butler has moderate snoring and "moaning" dring sleep.   She rarely awakes refreshed.  Her Epworth score (on amantadine) today was 14.  She has hx of anxiety/depression, currently on bupropion and duloxetine.    No hx of ON, trigeminal neuralgia, l'hermitte.    She also has chronic urinary urgency, frequency, and occasional urge incontinence.  She has no urinary hesitancy and thinks she empties her bladder (rare UTIs).  Her bowels are ok.  She has chronic LBP with occasonal radiation into right leg.  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  Her PCP has referred her to pain clinic.    She is o permanent disability due to MS fatigue/cognitive impairment.  Formerly worked at [employer].    Here to reestablish care at [facility].  She has been followed by Dr. Shea Butler and at [facility] for much of the past 10-15 years.              pvr=11 cc.    MS History:    Year of Dx:  2003  Clinical course:     Initial clinical presentation:  1998- brainstem event with horizontal diplopia and left facial droop, with single demyelinating lesion in pons, treated with IV steroids.   Later clinical presentations:      Initial MRI:1998- reportedly with single  pontine demyelinating lesion.    Follow-up MRI:  per [facility]    Brain/C-T spine MRIs from 08/2016 showed:  - Brain:  Multiple stable-appearing white matter lesions consistent the patient's diagnosis of multiple sclerosis.   - C-spine:  Central extrusion resulting in mild spinal stenosis. Bilateral foraminal narrowing at the C6-C7. Progression of right-sided foraminal narrowing at the C6-C7 level. Left lateral recess protrusion at the C5-C6 level. Moderate to severe left foraminal narrowing.   - T-spine:  Right subarticular protrusion's T7-8 and T11-12.      Most recent MRI brain: 08/2016  Most recent MRI cervical spine: 08/2016  Most  recent MRI thoracic spine: 08/2016      CSF:  1998- reportedly +OCBs.  Evoked potentials: VER, BAER, SSERs, all normal (2003)  Other testing:       Current DMT:  Copaxone (2003-present), 40 mg tow.    Prior DMT (dates, reasons for stopping):    Hx of immunosuppression:  JCV serostatus:    Symptomatic treatment FA:OZHYQMVHQ/[id number]:bupropion/duloxetine for depression (and pain).  Tizanidine for spasticity.        PMH:    Past Medical History:   Diagnosis Date    Anxiety 2004    Depression     MS (multiple sclerosis)        PSH:    Past Surgical History:   Procedure Laterality Date    APPENDECTOMY         MEDS:    Prior to Admission medications    Medication Sig Start Date End Date Taking? Authorizing Provider   DULoxetine (CYMBALTA) 60 MG DR capsule Take 1 capsule (60 mg total) by mouth daily 07/04/18   Marilynn RailGrove, Nadine, NP   cetirizine (ZYRTEC) 10 MG tablet Take 1 tablet (10 mg total) by mouth daily 07/04/18   Marilynn RailGrove, Nadine, NP   buPROPion (WELLBUTRIN XL) 300 MG 24 hr tablet Take 1 tablet (300 mg total) by mouth every morning Swallow whole. Do not crush, break, or chew. 07/04/18   Marilynn RailGrove, Nadine, NP   fluticasone [facility](FLONASE) 50 MCG/ACT nasal spray 1 spray by Nasal route daily    [provider]   triamcinolone (KENALOG) 0.1 % ointment Apply topically 2 times daily 06/15/18   Farrell OursMoore, Jillian, MD   tiZANidine (ZANAFLEX) 4 MG tablet Take 1 tablet (4 mg total) by mouth every 8 hours as needed for Muscle spasms 04/26/18   Marilynn RailGrove, Nadine, NP   COPAXONE 40 mg/mL prefilled syringe INJECT 1 ML (40 MG TOTAL) INTO THE SKIN THREE TIMES A WEEK 05/13/17   Marilynn RailGrove, Nadine, NP   nystatin (NYSTOP) 100000 UNIT/GM powder Apply topically 2 times daily as needed for Itching 08/09/13   Farrell OursMoore, Jillian, MD   ibuprofen (ADVIL,MOTRIN) 200 MG tablet Take 400 mg by mouth 2 times daily as needed for Pain    [provider]       ALLERGIES:    Allergies   Allergen Reactions    No Known Drug Allergy      Created by Conversion - 0;        FAMILY  HX:        Family History   Problem Relation Age of Onset    Multiple Sclerosis Father     Stroke Maternal Grandmother     Alzheimer's disease Maternal Grandmother     Hypertension Mother     Arthritis Mother     Breast cancer Paternal Aunt [id number]    Breast cancer Paternal Grandmother     Cancer Paternal Grandmother  Diabetes Paternal Grandmother     Lung cancer Paternal Uncle     Anxiety disorder Maternal Aunt     Bleeding problems Maternal Aunt     Blood clots Maternal Aunt     Depression Maternal Aunt     Cancer Paternal Aunt     Cancer Paternal Uncle     Cancer Paternal Grandfather        SOCIAL HX:    Social History     Socioeconomic History    Marital status: Married     Spouse name: Not on file    Number of children: Not on file    Years of education: Not on file    Highest education level: Not on file   Tobacco Use    Smoking status: Former Smoker     Packs/day: 1.00     Years: 5.00     Pack years: 5.00     Types: Cigarettes     Last attempt to quit: 06/07/1996     Years since quitting: 22.1    Smokeless tobacco: Never Used   Substance and Sexual Activity    Alcohol use: Yes     Comment: Only special occasions    Drug use: No    Sexual activity: Yes     Partners: Female     Birth control/protection: None   Other Topics Concern    Not on file   Social History Narrative    Lives with wife [name] and 2 dogs. Permanently disabled for multiple sclerosis.              Review of Systems:    CONSTITUTIONAL: Appetite good, no fevers, night sweats or weight loss  EYES: No visual changes, no eye pain; no dry eyes/dry mouth  ENT: No hearing difficulties, no ear pain, no tinnitus  CV: No chest pain, shortness of breath or peripheral edema  RESPIRATORY: No cough, wheezing or dyspnea  GI: No nausea/vomiting, abdominal pain, or change in bowel habits  GU: above  MS: No joint pain/swelling or musculoskeletal deformities  SKIN: chronic dry skin  PSYCH: above  ENDOCRINE: No diabetes or thyroid  disease  HEME/LYMPH: No easy bleeding/bruising or swollen nodes            Physical Exam  BP 137/81    Pulse 78    Ht 1.524 m (5')    Wt 84.4 kg (186 lb)    LMP  (LMP Unknown)    BMI 36.33 kg/m   :  General:  WD/WN.  NAD.  HEENT: normocephalic/atraumatic  Neck:  No bruit or l'hermitte  Cardiac:   RRR. S1/S2.  No murmurs      Neurologic:      Mental status: Awake, alert, oriented x 3, with grossly normal language.  Able to perform serial 7s with 1 error and subsequent correct responses.  Delayed recall 4/5.  Otherwise grossly intact cognition.    CN:  Pupils 4/4 to 3/3 without APD  Va  J1(-1)/J2.  No red desaturation.  Visual fields were full to confrontation and DSS. FundI:discs sharp, no pallor..  No papilledema.  Subtle residual left VI paresis w/rapid saccades.  Mild l/r g-e horizontal nystagmus..  No INO.  Pursuit  mildly choppy..  Facial sensation symmetric to temp/LT in V1-V3 bilaterally. No facial weakness.  Hearing normal/symmetric to finger rub. Palate elevates midline.  Shoulder shrug 5/5. Tongue protruded midline w/o atrophy or fasciculations.  No dysarthria.    Motor:  Upper extremities  (R/L)   SAbd 5/5   EF 5/5   EExt 5/5   Wrist extension 5/5   WF 5/5   FExt 5/5   Interossei/Intinsics 5/5   FFlex 5/5     Lower extremities  (R/L)   Hip flexion 5/5   Knee extension 5/5   Knee flexion 5/5   Ankle dorsiflexion 5/5   Ankle plantarlexion 5/5     No pronator drift.   Finger tapping intact bilaterally. Mild LE spasticity bilaterally, with preserved muscle bbulk.  No fasciculations.  Able to stand from chair w/arms crossed.  Very mild bilateral UE postural tremor.     Sensory:  Temperatue intact and symmetric in all distal extremities; no spinal level.  Vibration 20/20 seconds at great toes,  >20 seconds at index fingers.  Proprioception intact at great toes and index fingers.  Absent Romberg.  Cortical sensation intact.    Gait:  Mildly wide-based, symmetric arm swing.   Assistive device: None.  Able  to tandem walk..  T25FW=  6.27 seconds.    Coord:  No ataxia w/ FNF or heel-shin.  RAM intact.    DTRs:  Biceps 2+/2+, BR 2+/2+,  Triceps 2+/2+  Knees 3+/3+   Ankles 3+/3+   Mute plantar responses bilaterally.  Few beats of unsustained ankle clonus l/r. nO FRONTAL RELEASE SIGNS. Jaw reflex not exaggerated.            Lab Results:        Imaging:  none available        Assessment:     RRMS by hx, which has been relatively well controlled with longstanding Copaxone with regards to inflammatory disease activity.  She does endorse cognitive impairment, however.  This likely represents some degree of neurodeneration from MS, but need to consider reversible causes, including untreated obstructive sleep apnea (sleep hx suggestive of this possibility).  Her MS-related spasticity and fatigue (separate from EDS) seem to be controlled on symptomatic Rx.  She has evidence of failure-to-store NGB by sx and today's PVR (22 cc), so will try myrbetriq for this (less likely to cause cognitve sx than anti-Ach tx).  Will obtain updated brain MRI w/contrast to assess disease activity.    Plan:     As above.  Ms. Cutshaw will discuss getting sleep study with her PCP.  I offered sleep clinic referral as well.  RTC 3-4 months.    Thank you for the opportunity to evaluate this very pleasant patient.  Please feel free to call me at [phone] or e-mail me at [email or web address] with questions.    Author: Elroy Channel, MD  as of: 07/17/2018  at: 9:09 AM

## 2018-07-17 NOTE — Patient Instructions (Addendum)
I think you should have a sleep apnea evaluation.  You can speak with Dr. Christell Constant about this.  I can also refer you to sleep clinic.    We will get an updated brain MRI with contrast.    Routine blood tests.    Your post-void residual urine volume was low (11 cc).  We can try myrbetriq 25 mg once daily for your urgency.

## 2018-07-18 NOTE — Addendum Note (Signed)
Addended by: Elroy Channel on: 07/18/2018 11:23 AM     Modules accepted: Orders

## 2018-07-20 ENCOUNTER — Encounter: Payer: Self-pay | Admitting: Primary Care

## 2018-07-20 ENCOUNTER — Telehealth: Payer: Self-pay | Admitting: Primary Care

## 2018-07-20 DIAGNOSIS — G478 Other sleep disorders: Secondary | ICD-10-CM

## 2018-07-20 DIAGNOSIS — R5383 Other fatigue: Secondary | ICD-10-CM

## 2018-07-20 DIAGNOSIS — G35 Multiple sclerosis: Secondary | ICD-10-CM

## 2018-07-20 DIAGNOSIS — N319 Neuromuscular dysfunction of bladder, unspecified: Secondary | ICD-10-CM | POA: Insufficient documentation

## 2018-07-20 NOTE — Telephone Encounter (Signed)
Please help with referral  I rec'd consult note from pt's recent appt with her neuro and they are recommending sleep study, to make sure sleep apnea isn't explaining her memory concerns    Farrell Ours, MD  [facility]  07/20/2018   7:00 AM

## 2018-07-20 NOTE — Telephone Encounter (Signed)
I called the patient and left a message asking her to give our office a call back. Please see the message below. The phone number for sleep medicine is [phone].

## 2018-07-21 NOTE — Telephone Encounter (Signed)
Patient returned call and was given the message below.

## 2018-08-09 ENCOUNTER — Other Ambulatory Visit: Payer: Medicare (Managed Care) | Admitting: Radiology

## 2018-08-11 ENCOUNTER — Encounter: Payer: Self-pay | Admitting: Gastroenterology

## 2018-08-31 ENCOUNTER — Ambulatory Visit: Payer: Medicare (Managed Care) | Admitting: Radiology

## 2018-09-02 ENCOUNTER — Other Ambulatory Visit: Payer: Self-pay | Admitting: Primary Care

## 2018-09-04 NOTE — Telephone Encounter (Signed)
Last appt: 06/15/2018     Next appt:  09/02/2018        Recent Lab Values 02/17/2018   EXP DATE 04/06/18

## 2018-09-12 ENCOUNTER — Ambulatory Visit: Payer: Medicare (Managed Care) | Admitting: Obstetrics and Gynecology

## 2018-09-14 ENCOUNTER — Ambulatory Visit: Payer: Medicare (Managed Care) | Admitting: Primary Care

## 2018-10-10 ENCOUNTER — Encounter: Payer: Self-pay | Admitting: Primary Care

## 2018-10-10 ENCOUNTER — Ambulatory Visit: Payer: Medicare (Managed Care) | Admitting: Primary Care

## 2018-10-10 VITALS — Ht 60.0 in | Wt 187.0 lb

## 2018-10-10 DIAGNOSIS — Z789 Other specified health status: Secondary | ICD-10-CM

## 2018-10-10 DIAGNOSIS — Z0000 Encounter for general adult medical examination without abnormal findings: Secondary | ICD-10-CM

## 2018-10-10 NOTE — Progress Notes (Signed)
Due to pandemic event, telehome visit preformed via:           Video/Zoom     Location of Patient: home  Location of Telemedicine Provider: clinical office   Other participants in telemedicine encounter and roles: n/a    Consent was obtained from the patient to complete this video visit; including the potential for financial liability.    This visit was performed during a pandemic event and the vital signs including BMI and physical exam are limited or missing due to the patient's location.  _____________________________________________________________________    Today we reviewed and updated Jodi Butler smoking status, activities of daily living, depression screen, fall risk, medications and allergies.   I have counseled the patient in the above areas.     Subjective:     Chief Complaint: Jodi Butler is a 48 y.o. female here for a/an Subsequent Annual Medicare Visit    In general, Jodi HarmsShari A Jalloh rates their overall health as:  fair      Patient Care Team:  Farrell OursMoore, Jillian, MD as PCP - Charlyne QualeGeneral  Moore, Jillian, MD  Elinor DodgeValdessilva, Yeny as Health Home Care Manager - Internal     Current Outpatient Medications on File Prior to Visit   Medication Sig Dispense Refill    triamcinolone (KENALOG) 0.1 % ointment Apply cream/ointment to areas on hands. 30 g 3    mirabegron (MYRBETRIQ) 25 MG 24 hr tablet Take 1 tablet (25 mg total) by mouth daily 30 tablet 5    DULoxetine (CYMBALTA) 60 MG DR capsule Take 1 capsule (60 mg total) by mouth daily 90 capsule 3    cetirizine (ZYRTEC) 10 MG tablet Take 1 tablet (10 mg total) by mouth daily 90 tablet 1    buPROPion (WELLBUTRIN XL) 300 MG 24 hr tablet Take 1 tablet (300 mg total) by mouth every morning Swallow whole. Do not crush, break, or chew. 90 tablet 3    fluticasone (FLONASE) 50 MCG/ACT nasal spray 1 spray by Nasal route daily      tiZANidine (ZANAFLEX) 4 MG tablet Take 1 tablet (4 mg total) by mouth every 8 hours as needed for Muscle spasms 90 tablet 5    COPAXONE  40 mg/mL prefilled syringe INJECT 1 ML (40 MG TOTAL) INTO THE SKIN THREE TIMES A WEEK 12 mL 0    ibuprofen (ADVIL,MOTRIN) 200 MG tablet Take 400 mg by mouth 2 times daily as needed for Pain       No current facility-administered medications on file prior to visit.      Allergies   Allergen Reactions    No Known Drug Allergy      Created by Conversion - 0;      Patient Active Problem List    Diagnosis Date Noted    Neurogenic bladder 07/20/2018     myrbetriq started 07/2018      DJD (degenerative joint disease), lumbar 06/27/2018     L2/3 small L disc protrusion with mild/mod L NF encroachment, L4/5 L disc protrusion with moderate NF encroachment      DJD (degenerative joint disease), cervical 08/18/2016     Mild stenosis, b/l NF narrowing at C6/7, R progressed compared to 2016.   Mod/severe L NF narrowing at C5/6      History of echocardiogram 08/04/2016     08/03/2016, Stress Echo with Dobutamine  Negative dobutamine ECHO for ischemia  No ischemic EKG changes with dobutamine  Normal resting LVEF 60%  Minimal chest discomfort at baseline;  no significant change reported with stress.         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 Previously on Sertraline, fluoxetine, paxil.  Starting nuvigil by neuro for fatigue sx, stopped it due to cost.      Multiple sclerosis 03/15/2006     Relapsing remitting multiple sclerosis with ongoing symptoms with fatigue and spasticity. On Copaxone. Previously seen by [facility] (8/12). Seen by Dr. Derrill Kay.   Diagnosed in 2004, had sx of double vision.   Relapse 11/2014 in setting of UTI, rec'd prednisone taper and IV steroid infusion  ?relapse 08/2016 with sx of chest tightness, seen by neuro, did IV steroid infusion  MRI brain and cspine 08/2016 stable  12/19 L spine MRI ordered, amantandine increased to 200mg  BID  1/20 MRI L spine without new lesions  C/o cognitive changes 2020, getting MRI brain and  recommended to have sleep study       Past Medical History:   Diagnosis Date    Anxiety 2004    Depression     MS (multiple sclerosis)      Past Surgical History:   Procedure Laterality Date    APPENDECTOMY       Family History   Problem Relation Age of Onset    Multiple Sclerosis Father     Stroke Maternal Grandmother     Alzheimer's disease Maternal Grandmother     Hypertension Mother     Arthritis Mother     Breast cancer Paternal Aunt 27    Breast cancer Paternal Grandmother     Cancer Paternal Grandmother     Diabetes Paternal Grandmother     Lung cancer Paternal Uncle     Anxiety disorder Maternal Aunt     Bleeding problems Maternal Aunt     Blood clots Maternal Aunt     Depression Maternal Aunt     Cancer Paternal Aunt     Cancer Paternal Uncle     Cancer Paternal Grandfather      Social History     Socioeconomic History    Marital status: Married     Spouse name: Not on file    Number of children: Not on file    Years of education: Not on file    Highest education level: Not on file   Occupational History    Not on file   Tobacco Use    Smoking status: Former Smoker     Packs/day: 1.00     Years: 5.00     Pack years: 5.00     Types: Cigarettes     Last attempt to quit: 06/07/1996     Years since quitting: 22.3    Smokeless tobacco: Never Used   Substance and Sexual Activity    Alcohol use: Yes     Comment: Only special occasions    Drug use: No    Sexual activity: Yes     Partners: Female     Birth control/protection: None   Social History Narrative    Lives with wife [name] and 2 dogs. Permanently disabled for multiple sclerosis.        Objective:     Vital Signs: Ht 1.524 m (5')    Wt 84.8 kg (187 lb)    LMP 10/02/2018    BMI 36.52 kg/m    BMI: Body mass index is 36.52 kg/m.    Vision Screening Results (Welcome visit only):  No  exam data present    Depression Screening Results:  Recent Review Flowsheet Data     PHQ Scores 10/10/2018 06/15/2018 01/16/2018 08/30/2016    PSQ2 Q1 -  Interest/Pleasure N N N N    PSQ2 Q2 - Down, Depressed, Hopeless N N N N    PHQ Q9 - Better Off Dead - 0 - -    PHQ Calculated Score - 6 - -        Opioid Use/DAST- 10 Screening Results:   How many times in the past year have you used an illegal drug or used a prescription medication for nonmedical reasons?: 0 (10/10/2018  1:58 PM)    Activities of Daily Living/Functional Screening Results:  Is the person deaf or does he/she have serious difficulty hearing?: N  Is this person blind or does he/she have serious difficulty seeing even when wearing glasses?: N  *Vision Status: Visual aid   Does this person have serious difficulty walking or climbing stairs?: N  Does this person have difficulty dressing or bathing?: N  *Shopping: Independent  *House Keeping: Independent  *Managing Own Medications: Independent  *Handling Finances: Independent  If you need help, who helps you?: spouse  Difficulty doing errands due to a physicial, mental or emotional condition: Yes  Difficulty remembering or making decisions due to a physicial, mental or emotional condition: Yes      Fall Risk Screening Results:  Have you fallen in the last year?: No  Do you feel you are at risk for falling?: No      Assessment and Plan:      Cognitive Function:  Recall of recent and remote events appears:  Abnormal - cognitive concern due to MS is followed by neurology      Advanced Care Planning:  was discussed.  HCP on file in scanned media.  There were no changes made at this time.     The following health maintenance plan was reviewed with the patient:    Health Maintenance Topics with due status: Not Due       Topic Last Completion Date    DEPRESSION SCREEN YEARLY 06/15/2018    Cervical Cancer Screening Other 06/15/2018    Breast Cancer Screening Other 06/28/2018     Health Maintenance Topics with due status: Completed       Topic Last Completion Date    IMM-INFLUENZA 04/06/2018     Health Maintenance Topics with due status: Addressed       Topic Date  Due    HIV TESTING OFFERED Addressed     This health maintenance schedule, identified risks, a list of orders placed today and patient goals have been provided to Jodi Butler in the after visit summary.     Plan for any concerns identified during screening or risk assessments:  n/a       Marcha Dutton, NP  2:18 PM  10/10/2018

## 2018-10-10 NOTE — Patient Instructions (Signed)
Thank you for completing your Subsequent Annual Medicare Visit   with [place] today.     The purpose of this visits was to:     Screen for disease   Assess risk of future medical problems   Help develop a healthy lifestyle   Update vaccines   Get to know your doctor in case of an illness    Patient Care Team:  Farrell Ours, MD as PCP - Charlyne Quale, MD  Elinor Dodge as Health Home Care Manager - Internal     Medicare 5 Year Plan    The following items were identified as areas of concern during your screening today:  BMI greater than 25 - This is a risk for Heart Attack, Stroke, High Blood Pressure, Diabetes, High Cholesterol and other complications.       The Health Maintenance table below identifies screening tests and immunizations recommended by your health care team:  Health Maintenance   Topic Date Due    DEPRESSION SCREEN YEARLY  06/16/2019    Breast Cancer Screening Other  06/28/2020    Cervical Cancer Screening Other  06/16/2023    IMM-INFLUENZA  Completed    HIV TESTING OFFERED  Addressed     In addition, goals and orders placed to address these recommendations are listed in the "Today's Visit" section.    We wish you the best of health and look forward to seeing you again next year for your Annual Medicare Wellness Visit.     If you have any health care concerns before then, please do not hesitate to contact us.

## 2018-10-10 NOTE — H&P (Deleted)
History and Physical    HISTORY:  No chief complaint on file.        History of Present Illness:    CHIEF COMPLAINT: ( ) Prolapse  ( ) Urinary incontinence  ( ) LUTS/Incomplete bladder emptying    ( ) Frequent bladder infections  ( ) Bladder pain ( ) Bowel Dysfunction ( ) Other: XXX    HPI:    PROLAPSE: Vaginal Bulge: ( ) No  ( ) Yes: see below  Present for: ( ) < 3 months  ( ) 3-6 months ( ) 6-12 months ( ) 1-5 years ( ) > 5 years  ( ) Always present  ( ) Comes and Goes ( ) Worse with physical activity  Assoc. Sx: ( ) Pressure ( ) Discomfort ( ) Rubbing/Irritation ( ) Bleeding ( ) Abnormal/unusual discharge  Splinting: ( ) For urination  ( ) For defecation    Hysterectomy: ( ) No  ( )  Yes  Previous treatment: ( ) None  ( ) Pessary: XXX ( ) Surgery: XXX  ( ) Other: XXX  Additional notes: XXX                                                         URINARY INCONTINENCE: ( ) No  ( ) Yes: see below  Present for: ( ) < 3 months  ( ) 3-6 months  ( ) 6-12 months  ( ) 1-5 years  ( ) > 5 years  Wearing pads: ( ) No  ( ) Yes: ( ) Liner  ( ) Thin pad  ( ) Thick Pad  ( ) Undergarment     SUI: ( ) No  ( ) Yes: ( ) Cough  ( ) Sneeze ( ) Laugh ( ) Exercise: XXX ( ) Other: XXX  Frequency: ( ) < 1/month  ( ) 1-several x/month  ( ) 1-several x/week  ( ) Daily  Leakage amount: ( ) Drops or little  ( ) Wet pad/clothing  ( ) Soaked through pad/clothing   Previous Tx: ( ) None ( ) Kegels  ( ) PT  ( ) Impressa  ( ) Pessary  ( ) Surgery: XXX  ( ) Other: XXX  Additional notes: XXX            UUI/OAB: ( ) No  ( ) Yes  Frequency: ( ) < 1/month  ( ) 1- several x/month  ( ) 1-several x/week  ( ) Daily   Leakage amount: ( ) None ( ) Drops or little  ( ) Wet pad/clothing  ( ) Soaked through pad/clothing   Triggers: ( ) Key in door  ( ) Water cues  ( ) 1st AM void  ( ) Delayed voiding  ( ) On way to BR  ( ) Other: XXX  Warning time: ( ) None  ( ) Seconds  ( ) < 1 min  ( ) 1-5 minutes  ( ) > 5 min  ( ) > 10 min  ( ) Other: XXX    Previous Tx: ( )  None  ( ) Fluid management  ( ) Timed voids  ( ) Avoidance of bladder irritants  ( ) Kegels   ( ) PT  (x) Medication: Mirabegron 25 mg (07/17/2018) ( )  PTNS  ( ) SNM  ( ) Vesical Botulinum  ( ) Other: XXX    Contraindications to medications: ( ) No ( ) Yes: ( ) glaucoma ( ) urinary retention by hx ( ) gastroparesis   ( ) severe constipation ( ) Sjogren's  ( ) MG  ( ) cognitive impairment  ( ) uncontrolled HTN    Additional notes: XXX    OTHER UI: ( ) Unprovoked UI  ( ) Insensible UI  ( ) Constant UI  Frequency: ( ) < 1/month  ( ) 1-several x/month  ( ) 1-Several x/week  ( ) Daily   Leakage amount: ( ) Drops or little  ( ) Wet pad/clothing  ( ) Soaked through pad/clothing   Previous Tx: XXX  Additional notes: XXX      LUTS/INCOMPLETE EMPTYING: ( ) No  ( ) Yes: see below  Present for: ( ) < 3 months  ( ) 3-6 months  ( ) 6-12 months  ( ) 1-5 years  ( ) > 5 years  Frequency: ( ) <1/month  ( ) 1-several x/month  ( ) 1-several x/week  ( ) Daily   Storage sx: ( ) Increased daytime frequency  ( ) Increased daytime urgency  ( ) Decreased sense of urge to void  Nocturia: ( ) 1-2 x  ( ) 3-4 x  ( ) > 4 x      Bedtime: XXX  Up for day: XXX  Time of 1st void after falling asleep: XXX  History of OSA? ( ) No  ( ) Yes: using CPAP? ( ) No  ( ) Consistently  ( ) Inconsistently   Voiding sx: ( ) Sensation of incomplete emptying  ( ) Dysuria  ( ) Hesitancy  ( ) Intermittent stream  ( ) Weak stream   ( ) Slow stream  ( ) Straining to start stream  ( ) Straining to complete urination  ( ) PV dribbling   Reason for sensation of incomplete emptying: ( ) Bladder still feels full after voiding  ( ) Needs to void again soon after emptying bladder  ( ) Leaks soon after voiding  Previous Tx: ( ) None  ( ) Fluid management  ( ) Timed voids  ( ) Avoidance of bladder irritants  ( ) Kegels  ( ) PT  ( ) Medication  ( ) PTNS  ( ) SNM  ( ) Vesical Botulinum  ( ) ISC ( ) DDAVP    ( ) Other: XXX  Additional notes: XXX      FREQUENT BLADDER INFECTIONS: (  ) No  ( ) Yes: see below  Present for: ( ) < 3 months  ( ) 3-6 months  ( ) 6-12 months  ( ) 1-5 years  ( ) > 5 years  Frequency of infections: ( ) > 3 in last 12 months  ( ) > 2 in last 6 months  ( ) Other: XXX  Sx of infections: ( ) Frequency  ( ) Urgency  ( ) Dysuria  ( ) Pelvic discomfort  ( ) Gross hematuria    Other: XXX  Dx of infections: ( ) Based on symptoms  ( ) Urine dipsticks  ( ) Urine cultures  ( ) Unknown    Previous Tx: ( ) None  ( ) Increased fluid intake  ( ) Probiotics  ( ) Cranberry products  ( ) Herbal meds  ( ) D-mannose  ( )  Methenamine  ( ) Methenamine + vitamin C  ( ) Vaginal estrogen  ( ) Continuous antibiotic prophylaxis  ( ) Post-coital antibiotic prophylaxis    Risk factors: ( ) CKD  ( ) H/O renal transplant ( ) Urinary tract abnormality  ( ) Diabetes  ( ) Immunosuppression  ( ) Urinary retention ( ) ISC  ( ) Indwelling catheter  ( ) VVA  ( ) Sexually active     Additional notes: XXX    BLADDER PAIN: ( ) No  ( ) Yes: see below  Present for: ( ) < 3 months  ( ) 3-6 months  ( ) 6-12 months  ( ) 1-5 years  ( ) > 5 years  Frequency of sx: ( ) constant/daily  ( ) episodic: ( ) <1/month  ( ) 1-several x/month  ( ) 1-several x/week    Sx: ( ) Dysuria  ( ) Bladder pain  ( ) Urgency  ( ) Frequency ( ) Small voids  ( ) Pelvic discomfort/pain  ( ) Vaginal pain  ( ) Rectal pain  ( ) Dyspareunia (( ) Insertional  ( ) Deep)    ( ) Increasing pain as bladder fills  ( ) Relief of sx with voiding  ( ) Sx same day and night  ( ) Other:     Triggers: ( ) Unknown  ( ) Stress  ( ) Sexual activity  ( ) Hormone fluctuations  ( ) Tight clothing ( ) Exercise   ( ) Diet: ( ) Acidic foods ( ) Spicy foods  ( ) Alcohol  ( ) Caffeine ( ) Carbonated drinks  ( ) Chocolate    Known history of: ( ) Fibromyalgia  ( ) IBS  ( ) Chronic fatigue syndrome  ( ) Chronic pelvic pain     Previous Tx: ( ) None  ( ) IC diet  ( ) Stress management  ( ) PT  ( ) Amitriptyline  ( ) Cimetidine  ( ) Hydroxyzine  ( ) Pentosan polysulfate  (  ) Bladder instillation (( ) Heparin  ( ) Lidocaine  ( ) DMSO)   ( ) Hydrodistension  ( ) Hunner Ulcer Treatment (( ) Fulguration  ( ) Triamcinolone injection)  ( ) Intravesical botulinum  ( ) SNM  ( ) Cyclosporine A  ( ) Surgery: XXX  Additional notes: XXX    BOWEL DYSFUNCTION:   Defecatory Dysfunction: ( ) No  ( ) Yes  Present for: ( ) < 3 months  ( ) 3-6 months  ( ) 6-12 months  ( ) 1-5 years  ( ) > 5 years  Frequency of sx: ( ) <1/month  ( ) 1-several x/month  ( ) 1-several x/week  ( ) Daily  Frequency of BMs: ( ) 1-2 x Daily  ( ) Multiple per day  ( ) Every other day  ( ) 2-3 x week  ( ) 1 x week ( ) < 1 x week  Sx: ( ) Hard straining for BMs  ( ) Sensation of incomplete defecation  ( ) Pain with defecation  Symptoms of rectal prolapse: ( ) No  ( ) Yes   Known history of: ( ) IBS  ( ) Crohn's Dz  ( )  Ulcerative Colitis  ( ) Other: XXX  Previous Tx: ( ) None  ( ) Pelvic Floor PT  ( ) Fiber supplementation ( ) Stool softeners ( ) Suppositories   ( )  Polyethylene glycol  ( ) Stimulant laxatives  ( ) Enemas   Additional notes: XXX    Anal Incontinence: ( ) No  ( ) Yes  Present for: ( ) < 3 months  ( ) 3-6 months  ( ) 6-12 months  ( ) 1-5 years  ( ) > 5 years  Frequency of IEs: ( ) <1/month  ( ) 1-several x/month  ( ) 1-several x/week  ( ) Daily  Leakage: ( ) With awareness  ( ) Insensible  ( ) Preceded by fecal urgency  Leakage of: ( ) Loose stool ( ) Formed stool ( ) Pellets  ( ) Staining after BM   Wearing pads: ( ) No  ( ) Yes: ( ) Liner  ( ) Thin pad  ( ) Thick Pad  ( ) Undergarment   Known history of: ( ) OASIS ( ) Neurologic disease  ( ) Pelvic XRT  ( ) Anal surgery  ( ) Inflammatory Bowel Disease ( ) Other: XXX  Previous Tx: ( ) None ( ) Behavioral  ( ) Medication  ( ) Pelvic Floor PT  ( ) SNM  ( ) Surgery: XXX    OTHER: XXX  Present for: ( ) < 3 months  ( ) 3-6 months  ( ) 6-12 months  ( ) 1-5 years  ( ) > 5 years  Frequency of sx: ( ) <1/month  ( ) 1-several x/month  ( ) 1-several x/week  ( ) Daily  Sx:  XXX  Previous Tx: XXX  Additional Notes: XXX    GYN Hx:  Menstrual Status:   ( ) Postmenopausal: PMB ( ) No  ( ) Yes: XXX  ( ) Premenopausal: Childbearing ( ) Not complete  ( ) Complete: Contraception: XXX  Cervical Screening:   ( ) No history abnormal paps    ( ) History of abnormal paps: XXX  Sexual Activity:   ( ) Yes: ( ) No problems  ( ) Dyspareunia (( ) Insertional  ( ) Deep) ( ) Vaginal dryness: ( ) Lubricant ( ) Vag estrogen  ( ) No: Due to chief complaint?  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 May take another tab if needed.    amantadine (SYMMETREL) 100 MG tablet     triamcinolone (KENALOG) 0.1 %  ointment Apply cream/ointment to areas on hands.    mirabegron (MYRBETRIQ) 25 MG 24 hr tablet Take 1 tablet (25 mg total) by mouth daily    DULoxetine (CYMBALTA) 60 MG DR capsule Take 1 capsule (60 mg total) by mouth daily    cetirizine (ZYRTEC) 10 MG tablet Take 1 tablet (10 mg total) by mouth daily    buPROPion (WELLBUTRIN XL) 300 MG 24 hr tablet Take 1 tablet (300 mg total) by mouth every morning Swallow whole. Do not crush, break, or chew.    fluticasone (FLONASE) 50 MCG/ACT nasal  spray 1 spray by Nasal route daily    tiZANidine (ZANAFLEX) 4 MG tablet Take 1 tablet (4 mg total) by mouth every 8 hours as needed for Muscle spasms    COPAXONE 40 mg/mL prefilled syringe INJECT 1 ML (40 MG TOTAL) INTO THE SKIN THREE TIMES A WEEK    nystatin (NYSTOP) 100000 UNIT/GM powder Apply topically 2 times daily as needed for Itching    ibuprofen (ADVIL,MOTRIN) 200 MG tablet Take 400 mg by mouth 2 times daily as needed for Pain       Family History:    Family History   Problem Relation Age of Onset    Multiple Sclerosis Father     Stroke Maternal Grandmother     Alzheimer's disease Maternal Grandmother     Hypertension Mother     Arthritis Mother     Breast cancer Paternal Aunt [id number]    Breast cancer Paternal Grandmother     Cancer Paternal Grandmother     Diabetes Paternal Grandmother     Lung cancer Paternal Uncle     Anxiety disorder Maternal Aunt     Bleeding problems Maternal Aunt     Blood clots Maternal Aunt     Depression Maternal Aunt     Cancer Paternal Aunt     Cancer Paternal Uncle     Cancer Paternal Grandfather        Social/Occupational History:   Social History     Socioeconomic History    Marital status: Married     Spouse name: Not on file    Number of children: Not on file    Years of education: Not on file    Highest education level: Not on file   Tobacco Use    Smoking status: Former Smoker     Packs/day: 1.00     Years: 5.00     Pack years: 5.00     Types: Cigarettes     Last attempt to quit: 06/07/1996     Years since quitting: 22.3    Smokeless tobacco: Never Used   Substance and Sexual Activity    Alcohol use: Yes     Comment: Only special occasions    Drug use: No    Sexual activity: Yes     Partners: Female     Birth control/protection: None   Other Topics Concern    Not on file   Social History Narrative    Lives with wife Darel HongJudy and 2 dogs. Permanently disabled for multiple sclerosis.          Review of Systems:    Review of Systems   All other systems  reviewed and are negative.      Vital Signs:   There were no vitals taken for this visit.      PHYSICAL EXAM:  Physical Exam   Genitourinary:    Genitourinary Comments: Vulva: Normal external genitalia.  Symmetric labia.  No  skin lesions or pigmentation changes. Normal Bartholin's and Skene's glands.  Normal external urethral meatus.  No prolapse visible at the introitus.  Vagina: No epithelial lesions or pigmentation changes.  No tenderness with palpation underneath the bladder or urethra.  No release of urine or discharge with palpation underneath the urethra.  No tenderness with palpation over the pelvic floor muscles.   Cystocele:   Rectocele:   Apical Descensus:   Enterocele:   POP-Q Measurements: Aa =. Ba =. Ap =.  Bp =.  C = .  D =.  TV L =.    Uterus:   Adnexa: No masses.  Nontender.  Rectal Exam: No external hemorrhoidal tissue. No fissures.  No rectal mucosal prolapse visible.  No rectal masses.  Intact sphincter.  Good resting tone and contractility.  Neurologic Exam: Anal wink reflex present.  Motor and sensory exams grossly normal.    Pelvic floor Muscle Contraction:   Cough Stress Test:   Urethral Mobility:   Void:   Postvoid Residual Volume:     Urine dipstick: SG 1.010, pH 5, leukocytes negative, nitrite negative, protein negative, glucose negative, ketones negative, urobilinogen <1, bilirubin negative, blood negative.                   Assessment:    There are no diagnoses linked to this encounter.   .      Plan:      No problem-specific Assessment & Plan notes found for this encounter.

## 2018-10-11 ENCOUNTER — Ambulatory Visit: Payer: Medicare (Managed Care) | Admitting: Obstetrics and Gynecology

## 2018-10-12 ENCOUNTER — Telehealth: Payer: Self-pay | Admitting: Neurology

## 2018-10-12 ENCOUNTER — Encounter: Payer: Self-pay | Admitting: Neurology

## 2018-10-12 NOTE — Telephone Encounter (Signed)
Tried to reach patient, left a voicemail to give the office a call back, regarding her upcoming appointment. Also sent a reminder letter to her mychart and her address on file.

## 2018-10-17 ENCOUNTER — Encounter: Payer: Self-pay | Admitting: Gastroenterology

## 2018-10-25 ENCOUNTER — Telehealth: Payer: Self-pay | Admitting: Neurology

## 2018-10-25 ENCOUNTER — Encounter: Payer: Self-pay | Admitting: Neurology

## 2018-10-25 NOTE — Telephone Encounter (Signed)
Left a voicemail regarding upcoming appointment to give the office a call back. Also sent a reminder letter to mychart.

## 2018-10-28 ENCOUNTER — Other Ambulatory Visit: Payer: Self-pay | Admitting: Pulmonary and Critical Care Medicine

## 2018-10-31 NOTE — Telephone Encounter (Signed)
Last appt: 06/15/2018     Next appt:  02/14/2019        Recent Lab Values 02/17/2018   EXP DATE 04/06/18

## 2018-11-01 ENCOUNTER — Other Ambulatory Visit: Payer: Self-pay | Admitting: Pulmonary and Critical Care Medicine

## 2018-11-01 MED ORDER — GLATIRAMER ACETATE 40 MG/ML SC SOSY *I*
40.0000 mg | PREFILLED_SYRINGE | SUBCUTANEOUS | 0 refills | Status: DC
Start: 2018-11-01 — End: 2019-02-07

## 2018-11-01 NOTE — Telephone Encounter (Signed)
Last appt: 06/15/2018     Next appt:  02/14/2019        Recent Lab Values 02/17/2018   EXP DATE 04/06/18

## 2018-11-06 ENCOUNTER — Ambulatory Visit: Payer: Medicare (Managed Care) | Admitting: Neurology

## 2018-11-21 ENCOUNTER — Encounter: Payer: Self-pay | Admitting: Gastroenterology

## 2018-11-27 ENCOUNTER — Other Ambulatory Visit: Payer: Self-pay | Admitting: Neurology

## 2018-11-27 DIAGNOSIS — G35 Multiple sclerosis: Secondary | ICD-10-CM

## 2018-12-27 ENCOUNTER — Encounter: Payer: Self-pay | Admitting: Gastroenterology

## 2019-01-09 ENCOUNTER — Other Ambulatory Visit: Payer: Self-pay | Admitting: Pulmonary and Critical Care Medicine

## 2019-01-09 DIAGNOSIS — Z9109 Other allergy status, other than to drugs and biological substances: Secondary | ICD-10-CM

## 2019-01-09 NOTE — Telephone Encounter (Signed)
Last appt: 06/15/2018     Next appt:  02/14/2019        Recent Lab Values 02/17/2018   EXP DATE 04/06/18

## 2019-01-10 MED ORDER — CETIRIZINE HCL 10 MG PO TABS *I*
10.0000 mg | ORAL_TABLET | Freq: Every day | ORAL | 1 refills | Status: DC
Start: 2019-01-10 — End: 2019-07-11

## 2019-01-16 ENCOUNTER — Ambulatory Visit: Payer: Medicare (Managed Care) | Admitting: Sleep Medicine

## 2019-01-24 ENCOUNTER — Telehealth: Payer: Self-pay

## 2019-01-24 NOTE — Telephone Encounter (Signed)
I called the patient and left a message asking them to contact our office. The patient had an appointment on 02/14/19 with [name]. [name] is leaving the practice. Please reschedule the patient when they call back.  I have sent the patient a mychart message.

## 2019-01-26 ENCOUNTER — Encounter: Payer: Self-pay | Admitting: Gastroenterology

## 2019-01-29 NOTE — Telephone Encounter (Signed)
The patient has been rescheduled.

## 2019-01-31 ENCOUNTER — Other Ambulatory Visit: Payer: Medicare (Managed Care)

## 2019-01-31 ENCOUNTER — Ambulatory Visit: Payer: Medicare (Managed Care)

## 2019-02-07 ENCOUNTER — Other Ambulatory Visit: Payer: Self-pay | Admitting: Pulmonary and Critical Care Medicine

## 2019-02-07 NOTE — Telephone Encounter (Signed)
Last appt: 06/15/2018     Next appt:  04/09/2019        Recent Lab Values 02/17/2018   EXP DATE 04/06/18

## 2019-02-09 ENCOUNTER — Encounter: Payer: Self-pay | Admitting: Gastroenterology

## 2019-02-14 ENCOUNTER — Ambulatory Visit: Payer: Medicare (Managed Care) | Admitting: Pulmonary and Critical Care Medicine

## 2019-02-23 ENCOUNTER — Other Ambulatory Visit: Payer: Self-pay | Admitting: Primary Care

## 2019-02-23 NOTE — Telephone Encounter (Signed)
Last appt: 06/15/2018     Next appt:  04/09/2019        Recent Lab Values 02/17/2018   EXP DATE 04/06/18

## 2019-03-07 ENCOUNTER — Other Ambulatory Visit: Payer: Self-pay | Admitting: Pulmonary and Critical Care Medicine

## 2019-03-08 NOTE — Telephone Encounter (Signed)
Last appt: 06/15/2018     Next appt:  04/09/2019        Recent Lab Values 02/17/2018   EXP DATE 04/06/18

## 2019-03-15 ENCOUNTER — Encounter: Payer: Self-pay | Admitting: Gastroenterology

## 2019-04-02 ENCOUNTER — Other Ambulatory Visit: Payer: Self-pay | Admitting: Primary Care

## 2019-04-02 NOTE — Telephone Encounter (Signed)
Last appt: 06/15/2018     Next appt:  04/09/2019        Recent Lab Values 02/17/2018   EXP DATE 04/06/18

## 2019-04-03 MED ORDER — GLATIRAMER ACETATE 40 MG/ML SC SOSY *I*
PREFILLED_SYRINGE | SUBCUTANEOUS | 0 refills | Status: DC
Start: 2019-04-03 — End: 2019-05-07

## 2019-04-09 ENCOUNTER — Ambulatory Visit: Payer: Medicare (Managed Care) | Admitting: Primary Care

## 2019-04-09 VITALS — BP 122/64 | HR 77 | Temp 97.0°F | Ht 60.0 in | Wt 175.0 lb

## 2019-04-09 DIAGNOSIS — F419 Anxiety disorder, unspecified: Secondary | ICD-10-CM

## 2019-04-09 DIAGNOSIS — H6120 Impacted cerumen, unspecified ear: Secondary | ICD-10-CM

## 2019-04-09 DIAGNOSIS — F32A Depression, unspecified: Secondary | ICD-10-CM

## 2019-04-09 DIAGNOSIS — F329 Major depressive disorder, single episode, unspecified: Secondary | ICD-10-CM

## 2019-04-09 DIAGNOSIS — Z23 Encounter for immunization: Secondary | ICD-10-CM

## 2019-04-09 NOTE — Progress Notes (Signed)
[facility]    SUBJECTIVE    Jodi Butler is a 48 y.o. female who presents, for Depression and Flu Vaccine (patient consents to flu vaccine )    Depression-Patient is present with partner Jodi Butler.  Patient reports that she is feeling well.  Denies any suicidal thoughts. Patient continues to take her bupropion and duloxetine daily.  Patient denies any concerns.  The patient's partner feels differently.  She feels that Jodi Butler's anxiety has gotten worse and at times become out of control.  She notes that it is worse around specific activities usually in regards to leaving the house.  Jodi Butler notes that Jodi Butler appears irritable at times.  Jodi Butler does not seem to concerned with it and doesn't feel that it is a problem at this time.    Patient notes that she is starting to feel her wax build up in her ears as she is feeling pressure at times.  Denies any pain, congestion or headaches.    I personally reviewed the patient's medications, allergies, medical history and updated as appropriate.      Current Outpatient Medications   Medication Sig    glatiramer (GLATOPA) 40 mg/mL prefilled syringe INJECT CONTENTS OF 1 SYRINGE UNDER THE SKIN THREE TIMES A WEEK    fluticasone (FLONASE) 50 MCG/ACT nasal spray SPRAY 1 SPRAY INTO EACH NOSTRIL ONE TIME DAILY    cetirizine (ZYRTEC) 10 MG tablet Take 1 tablet (10 mg total) by mouth daily    MYRBETRIQ 25 MG 24 hr tablet TAKE 1 TABLET BY MOUTH EVERY DAY    amantadine (SYMMETREL) 100 MG tablet     DULoxetine (CYMBALTA) 60 MG DR capsule Take 1 capsule (60 mg total) by mouth daily    buPROPion (WELLBUTRIN XL) 300 MG 24 hr tablet Take 1 tablet (300 mg total) by mouth every morning Swallow whole. Do not crush, break, or chew.    tiZANidine (ZANAFLEX) 4 MG tablet TAKE 1 TABLET BY MOUTH EVERY 8 HOURS AS NEEDED FOR MUSCLE SPASMS    triamcinolone (KENALOG) 0.1 % ointment Apply cream/ointment to areas on hands.    ibuprofen (ADVIL,MOTRIN) 200 MG tablet Take 400 mg by mouth 2 times  daily as needed for Pain       ROS  See pertinent HPI      OBJECTIVE  Vitals:    04/09/19 1326   BP: 122/64   BP Location: Right arm   Pulse: 77   Temp: 36.1 C (97 F)   SpO2: 98%   Weight: 79.4 kg (175 lb)   Height: 1.524 m (5')     Body mass index is 34.18 kg/m.      GENERAL: A&Ox3, pleasant, cooperative, well groomed, appropriately dressed, actively engaged in encounter, NAD.  EYES: PERRLA. EOMI. Conjunctiva pink, without swelling or exudate. Lids normal.   ENT: Bilateral TMs visualized and clear and canals with cerumen noted on the edges bilat, nasal mucosa clear, PSYCH: AAOx3, normal affect and mood. Insight and judgement intact.   Over the last two weeks, have you often been bothered by feeling down, depressed or hopeless?: N (04/09/2019  3:54 PM)  Over the last two weeks, have you often had little interest or pleasure in doing things?: N (04/09/2019  3:54 PM)  PHQ Total Score: 6 (04/09/2019  3:54 PM)    GAD-7 Score: 6 (04/09/2019  3:55 PM)      ASSESSMENT & PLAN    1. Depression, unspecified depression type  2. Anxiety  Continue current medication regimen and  dosing.  Patient prefers to journal and track her episodes and work with her partner on what the triggers may be before adding or changing medication at this time.    3. Impacted cerumen, unspecified laterality  Will try OTC debrox.  If continues to be problematic will flush ears at next visit.    4. Immunization due  immunized  - Influenza 0.103mL prefilled syringe/single-dose vial (FluLaval, Fluzone, Afluria, Fluarix)  - Tdap >/=65yr(BOOSTRIX/ADACEL) vaccine IM (AMBULATORY USE ONLY)  Pt denies current illness/fever, recent vaccine in last 4 weeks, allergy to egg or flu vaccine component, h/o Guillain-Barre or current pregnancy. I have educated the patient and/or parent/guardian/designee about each component in all the immunizations and toxoids that are being given today, have reviewed potential vaccine side effects and have answered their questions about  the vaccinations.     Patient Instructions   1. Journal anxiety provoking events.  2. Continue medication.  3. Follow up in 2-3 months.    Follow-up: No follow-ups on file.      Patient verbalized understanding of information presented,and confirmed agreement with current plan of care.  Reviewed risks, benefits, and administration of prescribed/refilled medications. Precautions reviewed.      Graceann Congress, FNP-C  04/11/2019  12:06 PM

## 2019-04-09 NOTE — Patient Instructions (Signed)
1. Journal anxiety provoking events.  2. Continue medication.  3. Follow up in 2-3 months.

## 2019-04-19 ENCOUNTER — Encounter: Payer: Self-pay | Admitting: Primary Care

## 2019-04-19 DIAGNOSIS — F419 Anxiety disorder, unspecified: Secondary | ICD-10-CM

## 2019-04-24 MED ORDER — ESCITALOPRAM OXALATE 10 MG PO TABS *I*
ORAL_TABLET | ORAL | 1 refills | Status: DC
Start: 2019-04-24 — End: 2019-06-04

## 2019-04-24 NOTE — Addendum Note (Signed)
Addended by: Daine Floras on: 04/24/2019 09:05 AM     Modules accepted: Orders

## 2019-05-07 ENCOUNTER — Other Ambulatory Visit: Payer: Self-pay

## 2019-05-07 NOTE — Telephone Encounter (Signed)
Last appt: 04/09/2019     Next appt:  06/12/2019        Recent Lab Values 02/17/2018   EXP DATE 04/06/18

## 2019-05-08 MED ORDER — GLATIRAMER ACETATE 40 MG/ML SC SOSY *I*
PREFILLED_SYRINGE | SUBCUTANEOUS | 3 refills | Status: DC
Start: 2019-05-08 — End: 2019-12-21

## 2019-05-08 NOTE — Telephone Encounter (Signed)
Patient will be out soon and pharmacy is looking to mail it out soon. Please advise

## 2019-05-29 ENCOUNTER — Other Ambulatory Visit: Payer: Self-pay

## 2019-05-29 NOTE — Telephone Encounter (Signed)
Last appt: 04/09/2019     Next appt:  06/12/2019        Recent Lab Values 02/17/2018   EXP DATE 04/06/18

## 2019-05-30 MED ORDER — TRIAMCINOLONE ACETONIDE 0.1 % EX OINT *I*
TOPICAL_OINTMENT | CUTANEOUS | 3 refills | Status: DC
Start: 2019-05-30 — End: 2021-04-23

## 2019-06-04 ENCOUNTER — Other Ambulatory Visit: Payer: Self-pay

## 2019-06-04 DIAGNOSIS — F32A Depression, unspecified: Secondary | ICD-10-CM

## 2019-06-04 DIAGNOSIS — F419 Anxiety disorder, unspecified: Secondary | ICD-10-CM

## 2019-06-04 MED ORDER — ESCITALOPRAM OXALATE 10 MG PO TABS *I*
ORAL_TABLET | ORAL | 1 refills | Status: DC
Start: 2019-06-04 — End: 2019-07-11

## 2019-06-04 MED ORDER — DULOXETINE HCL 60 MG PO CPEP *I*
60.0000 mg | DELAYED_RELEASE_CAPSULE | Freq: Every day | ORAL | 3 refills | Status: DC
Start: 2019-06-04 — End: 2020-06-05

## 2019-06-04 NOTE — Telephone Encounter (Signed)
Last appt: 04/09/2019     Next appt:  06/12/2019        Recent Lab Values 02/17/2018   EXP DATE 04/06/18

## 2019-06-07 ENCOUNTER — Encounter: Payer: Self-pay | Admitting: Primary Care

## 2019-06-07 DIAGNOSIS — Z0000 Encounter for general adult medical examination without abnormal findings: Secondary | ICD-10-CM

## 2019-06-07 DIAGNOSIS — E162 Hypoglycemia, unspecified: Secondary | ICD-10-CM

## 2019-06-07 NOTE — Telephone Encounter (Signed)
Spoke to pt who states she did not write the [employer] so she was unable to explain, Probation officer read the [employer] back to pt ,  states 2 weeks ago pt had an episode where she was driving and pt began "seeing stars", states she can't remember if he blood glucose was checked but says her spouse gave her glucose tabs and she felt better, 2 days ago pt states she ate waffles and drank coffee in the morning and after 1 hour pt began to feel weak and "seeing stars" pt was home so she checked her BG with wife's glucometer , noted 59, drank orange juice, 15 min BG 103, pt states since then she had been feeling good without symptoms , please advise

## 2019-06-07 NOTE — Telephone Encounter (Signed)
Patient has an appointment on 1/5 with Velna Hatchet do you want that cancelled?

## 2019-06-07 NOTE — Telephone Encounter (Signed)
Thanks for noticing that. No, let's have her keep that appt with BB and cancel the one with me now on 1/8  Since I double booked her. I will talk with BB    Pt made aware via mychart to keep BB's appt    Johny Drilling, MD  [place]  06/07/2019  1:36 PM

## 2019-06-07 NOTE — Telephone Encounter (Signed)
Please book pt with me on Friday 1/8 at 2:20 re; low BG  Pt aware via mychart    Johny Drilling, MD  [place]  06/07/2019  1:25 PM

## 2019-06-11 ENCOUNTER — Encounter: Payer: Self-pay | Admitting: Primary Care

## 2019-06-11 NOTE — Telephone Encounter (Signed)
Can book pt with me on Friday 1/8 at 2:20 re; low BG  Pt aware via Konrad Felix, MD  [facility]  06/11/2019  6:07 PM

## 2019-06-12 ENCOUNTER — Ambulatory Visit: Payer: Medicare (Managed Care) | Admitting: Primary Care

## 2019-06-12 NOTE — Telephone Encounter (Signed)
Patient has been scheduled

## 2019-06-13 ENCOUNTER — Other Ambulatory Visit
Admission: RE | Admit: 2019-06-13 | Discharge: 2019-06-13 | Disposition: A | Discharge status: Home / Self Care | Payer: Medicare (Managed Care) | Source: Ambulatory Visit | Attending: Primary Care | Admitting: Primary Care

## 2019-06-13 DIAGNOSIS — G35 Multiple sclerosis: Secondary | ICD-10-CM | POA: Insufficient documentation

## 2019-06-13 DIAGNOSIS — Z0000 Encounter for general adult medical examination without abnormal findings: Secondary | ICD-10-CM

## 2019-06-13 DIAGNOSIS — E162 Hypoglycemia, unspecified: Secondary | ICD-10-CM | POA: Insufficient documentation

## 2019-06-13 LAB — COMPREHENSIVE METABOLIC PANEL
ALT: 28 U/L (ref 0–35)
AST: 18 U/L (ref 0–35)
Albumin: 4.5 g/dL (ref 3.5–5.2)
Alk Phos: 78 U/L (ref 35–105)
Anion Gap: 10 (ref 7–16)
Bilirubin,Total: 0.4 mg/dL (ref 0.0–1.2)
CO2: 27 mmol/L (ref 20–28)
Calcium: 9.6 mg/dL (ref 8.6–10.2)
Chloride: 102 mmol/L (ref 96–108)
Creatinine: 0.82 mg/dL (ref 0.51–0.95)
GFR,Black: 98 *
GFR,Caucasian: 85 *
Glucose: 84 mg/dL (ref 60–99)
Lab: 10 mg/dL (ref 6–20)
Potassium: 4.2 mmol/L (ref 3.3–5.1)
Sodium: 139 mmol/L (ref 133–145)
Total Protein: 6.6 g/dL (ref 6.3–7.7)

## 2019-06-13 LAB — LIPID PANEL
Chol/HDL Ratio: 2.5
Cholesterol: 179 mg/dL
HDL: 73 mg/dL — ABNORMAL HIGH (ref 40–60)
LDL Calculated: 97 mg/dL
Non HDL Cholesterol: 106 mg/dL
Triglycerides: 45 mg/dL

## 2019-06-13 LAB — BETA HYDROXYBUTYRATE: Beta Hydroxybutyrate: 0.08 mmol/L (ref 0.02–0.27)

## 2019-06-14 LAB — HEPATITIS C ANTIBODY: Hep C Ab: NEGATIVE

## 2019-06-14 LAB — HEMOGLOBIN A1C: Hemoglobin A1C: 5.1 %

## 2019-06-15 ENCOUNTER — Encounter: Payer: Self-pay | Admitting: Primary Care

## 2019-06-15 ENCOUNTER — Ambulatory Visit: Payer: Medicare (Managed Care) | Admitting: Primary Care

## 2019-06-15 VITALS — BP 146/92 | HR 83 | Temp 97.1°F | Ht 60.0 in | Wt 178.6 lb

## 2019-06-15 DIAGNOSIS — H6123 Impacted cerumen, bilateral: Secondary | ICD-10-CM

## 2019-06-15 DIAGNOSIS — E161 Other hypoglycemia: Secondary | ICD-10-CM

## 2019-06-15 DIAGNOSIS — R030 Elevated blood-pressure reading, without diagnosis of hypertension: Secondary | ICD-10-CM

## 2019-06-15 DIAGNOSIS — E162 Hypoglycemia, unspecified: Secondary | ICD-10-CM

## 2019-06-15 LAB — INSULIN, TOTAL: Insulin: 9 u[IU]/mL (ref 3–25)

## 2019-06-15 LAB — C-PEPTIDE: C-Peptide: 2.5 ng/mL (ref 1.1–4.4)

## 2019-06-15 MED ORDER — CARBAMIDE PEROXIDE 6.5 % OT SOLN *I*
5.0000 [drp] | Freq: Two times a day (BID) | OTIC | 0 refills | Status: DC
Start: 2019-06-15 — End: 2019-07-11

## 2019-06-15 NOTE — Progress Notes (Signed)
[facility]       SUBJECTIVE    Pt is here to discuss:    Chief Complaint   Patient presents with    Blood Sugar Problem     patient has been monitoring BG due to lightheadness and has had low readings         1. Hypoglycemia -   Per wife, happening daily 2h after bfast meals. They are now tracking this; bring in food/BG log today.      2 pieces of white toast + PB - BG 60 2h later    98 prebfast one day, then 2 cups coffee with creamer/suger, waffle/syrup - BG 59 2h later    This AM 97 fasting, 3 pieces of french toast, 2 cups coffee, 3 [place] bacon- 2hr 56     Doesn't usually eat lunch.  Dinner - chicken, vegetable  One day predinner BG 74  Doesn't check BG after dinner as she is not usually symptomatic after dinner    2. Elevated BP - extremely high BP on arrival today. Admits to episodic h/a, blurred vision. Doesn't check BP at home      BP Readings from Last 3 Encounters:   06/15/19 (!) 182/120   04/09/19 122/64   07/17/18 137/81      3. Cerumen impaction - asking for cerumen removal today    PMH / Family Hx / Social Hx  Patient's medications, allergies, problem list, past medical, social histories were reviewed and notable for:    Current Outpatient Medications   Medication Sig    DULoxetine (CYMBALTA) 60 MG DR capsule Take 1 capsule (60 mg total) by mouth daily    escitalopram (LEXAPRO) 10 MG tablet Take 1/2 tablet daily x1 week and then increase to 1 tablet daily after that.    triamcinolone (KENALOG) 0.1 % ointment Apply cream/ointment to areas on hands.    glatiramer (GLATOPA) 40 mg/mL prefilled syringe INJECT CONTENTS OF 1 SYRINGE UNDER THE SKIN THREE TIMES A WEEK    fluticasone (FLONASE) 50 MCG/ACT nasal spray SPRAY 1 SPRAY INTO EACH NOSTRIL ONE TIME DAILY    cetirizine (ZYRTEC) 10 MG tablet Take 1 tablet (10 mg total) by mouth daily    MYRBETRIQ 25 MG 24 hr tablet TAKE 1 TABLET BY MOUTH EVERY DAY    amantadine (SYMMETREL) 100 MG tablet     buPROPion (WELLBUTRIN XL) 300 MG 24 hr  tablet Take 1 tablet (300 mg total) by mouth every morning Swallow whole. Do not crush, break, or chew.    tiZANidine (ZANAFLEX) 4 MG tablet TAKE 1 TABLET BY MOUTH EVERY 8 HOURS AS NEEDED FOR MUSCLE SPASMS    ibuprofen (ADVIL,MOTRIN) 200 MG tablet Take 400 mg by mouth 2 times daily as needed for Pain       ROS  15-20lb weight loss in 79yr      OBJECTIVE  Vitals:    06/15/19 1427 06/15/19 1435   BP: (!) 182/120 (!) 146/92   BP Location: Left arm    Patient Position: Sitting    Pulse: 83    Temp: 36.2 C (97.1 F)    SpO2: 99%    Weight: 81 kg (178 lb 9.6 oz)    Height: 1.524 m (5')      Body mass index is 34.88 kg/m.      General: well-appearing Caucasian female, pleasant & conversant, in NAD     ENT: B/L TMs partially obscured by deep, hard cerumen.  Psych: AAOx3, normal affect and mood. Insight and judgement intact.           ASSESSMENT & PLAN  1. Postprandial hypoglycemia  Review of food diary shows hypoglycemia occurring after high carb/high sugar bfast meal. Encouraged changing bfast meal to foods with lower glycemic index. Given handouts on better choices. Reviewed labs that are neg for elevated insulin or abnormal glucose parameters    2. Elevated blood pressure reading without diagnosis of hypertension  Requested pt start monitoring BP at home, once daily and when symptomatic. Low threshold to do pheochromocytoma work up if repeat elevated readings.     3. Bilateral impacted cerumen  Unable to remove with curette today given deep location. Encouraged use of debrox      Follow-up: as scheduled In february      Farrell Ours, MD  [facility]  06/15/2019  2:35 PM        ______________________

## 2019-06-27 ENCOUNTER — Encounter: Payer: Self-pay | Admitting: Gastroenterology

## 2019-07-11 ENCOUNTER — Encounter: Payer: Self-pay | Admitting: Primary Care

## 2019-07-11 ENCOUNTER — Ambulatory Visit: Payer: Medicare (Managed Care) | Admitting: Primary Care

## 2019-07-11 ENCOUNTER — Other Ambulatory Visit: Payer: Self-pay

## 2019-07-11 DIAGNOSIS — F32A Depression, unspecified: Secondary | ICD-10-CM

## 2019-07-11 DIAGNOSIS — F4321 Adjustment disorder with depressed mood: Secondary | ICD-10-CM

## 2019-07-11 DIAGNOSIS — Z9109 Other allergy status, other than to drugs and biological substances: Secondary | ICD-10-CM

## 2019-07-11 DIAGNOSIS — F419 Anxiety disorder, unspecified: Secondary | ICD-10-CM

## 2019-07-11 DIAGNOSIS — F329 Major depressive disorder, single episode, unspecified: Secondary | ICD-10-CM

## 2019-07-11 MED ORDER — CETIRIZINE HCL 10 MG PO TABS *I*
10.0000 mg | ORAL_TABLET | Freq: Every day | ORAL | 1 refills | Status: DC
Start: 2019-07-11 — End: 2019-12-19

## 2019-07-11 MED ORDER — CARBAMIDE PEROXIDE 6.5 % OT SOLN *I*
5.0000 [drp] | Freq: Two times a day (BID) | OTIC | 0 refills | Status: DC
Start: 2019-07-11 — End: 2021-04-23

## 2019-07-11 MED ORDER — ESCITALOPRAM OXALATE 10 MG PO TABS *I*
ORAL_TABLET | ORAL | 3 refills | Status: DC
Start: 2019-07-11 — End: 2019-10-12

## 2019-07-11 NOTE — Telephone Encounter (Signed)
Last appt: 06/15/2019     Next appt:  07/11/2019        Recent Lab Values 02/17/2018   EXP DATE 04/06/18

## 2019-07-11 NOTE — Progress Notes (Signed)
Video Visit     Location of Patient: home    Location of Telemedicine Provider: hospital / clinical location    Other participants in telemedicine encounter and roles:  [name]    This is an established patient visit.    Reason for visit: Anxiety    Consent was obtained from the patient to complete this video visit; including the potential for financial liability.      SUBJECTIVE    Jodi Butler is a 49 y.o. female who presents, for Anxiety    Patient presents to this visit for follow up of her anxiety/depression.  Patient continues with her bupropion daily.  She was placed on lexapro and feels that has helped.  Her partner [name] notes that she has seen a difference in her behaviors and demeanor as well.  Her partner recently lost her dad and the family has been grieving.  Denies any suicidal thoughts.    I personally reviewed the patient's medications, allergies, medical history and updated as appropriate.      Current Outpatient Medications   Medication Sig    escitalopram (LEXAPRO) 10 MG tablet Take 1 tab daily    carbamide peroxide (DEBROX) 6.5 % otic solution Place 5 drops into both ears 2 times daily    DULoxetine (CYMBALTA) 60 MG DR capsule Take 1 capsule (60 mg total) by mouth daily    triamcinolone (KENALOG) 0.1 % ointment Apply cream/ointment to areas on hands.    glatiramer (GLATOPA) 40 mg/mL prefilled syringe INJECT CONTENTS OF 1 SYRINGE UNDER THE SKIN THREE TIMES A WEEK    fluticasone (FLONASE) 50 MCG/ACT nasal spray SPRAY 1 SPRAY INTO EACH NOSTRIL ONE TIME DAILY    cetirizine (ZYRTEC) 10 MG tablet Take 1 tablet (10 mg total) by mouth daily    MYRBETRIQ 25 MG 24 hr tablet TAKE 1 TABLET BY MOUTH EVERY DAY    tiZANidine (ZANAFLEX) 4 MG tablet TAKE 1 TABLET BY MOUTH EVERY 8 HOURS AS NEEDED FOR MUSCLE SPASMS    amantadine (SYMMETREL) 100 MG tablet     buPROPion (WELLBUTRIN XL) 300 MG 24 hr tablet Take 1 tablet (300 mg total) by mouth every morning Swallow whole. Do not crush, break, or chew.     ibuprofen (ADVIL,MOTRIN) 200 MG tablet Take 400 mg by mouth 2 times daily as needed for Pain       ROS  See pertinent HPI      OBJECTIVE  There were no vitals filed for this visit.  There is no height or weight on file to calculate BMI.      GENERAL: A&Ox3, pleasant, cooperative, well groomed, appropriately dressed, actively engaged in encounter, NAD.  PSYCH: AAOx3, normal affect and mood. Insight and judgement intact.     ASSESSMENT & PLAN    1. Depression, unspecified depression type  2. Anxiety  Continue current medication regimen and dosing.  Discussed that increasing the dose could potentially produce side effects due to other use of other medications.  Discussed anticipated time frame of full efficacy.  Will continue current regimen.  - escitalopram (LEXAPRO) 10 MG tablet; Take 1 tab daily  Dispense: 90 tablet; Refill: 3    3. Grief  Reflective listening provided.  Support offered in the form of visits if needed during this difficult time.      Follow-up: Return as scheduled.      Patient verbalized understanding of information presented,and confirmed agreement with current plan of care.  Reviewed risks, benefits, and administration of prescribed/refilled medications.  Precautions reviewed.      Lora Havens, FNP-C  07/11/2019  1:28 PM

## 2019-07-11 NOTE — Telephone Encounter (Signed)
Pt is already scheduled for zoom

## 2019-07-12 ENCOUNTER — Ambulatory Visit: Payer: Medicare (Managed Care) | Attending: Neurology | Admitting: Neurology

## 2019-07-12 DIAGNOSIS — G35 Multiple sclerosis: Secondary | ICD-10-CM | POA: Insufficient documentation

## 2019-07-12 NOTE — Progress Notes (Signed)
Video Visit     Location of Patient: home  Location of Telemedicine Provider: home / other  Other participants in telemedicine encounter and roles:  none  This is an established patient visit.  Reason for visit: Multiple Sclerosis      HPI  I saw Jodi Butler in follow up for her multiple sclerosis (she has previously followed with Dr. Kellie Moor and others in this clinic).  She is a 49 y.o. who had an attack of diplopia and left facial weakness in 1998 with a lesion in the pons, and was diagnosed with MS in 2003 after an attack of bilateral leg weakness and sensory symptoms, with new brain lesions typical of MS and CSF with oligoclonal bands.  She started Copaxone in 2004 and has continued it since, eventually transitioning to 40 mg three times per week dosing and generic (Glatopa).  She has not had further attacks but has struggled with faitigue and cognitive symptoms (due to which she is on disability), and spasms.    She has not had a clinical attack in years, and tolerates glatiramer acetate fairly well.  She feels fatigued throughout the day, and feels that cognitive symptoms are getting worse, with forgetfulness (forgets conversations, writes lots of notes) and difficulty multitasking.  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  She struggles with depression as well.    Her walking range is limited, using a scooter for long distances, and though her balance sometimes feels off she is steady enough without any assistive devices for short distances and has had no falls.  She has neuropathic pain in her legs every day, that is constant but often worsens after activity.  Cymbalta and gabapentin have not helped this.  She has spasms in her legs which respond to tizanidine (baclofen did not help at all in the past although she uses this sparingly because it gives her headache.  Neither pain nor spasms keep her from getting to sleep.  She has urinary  frequency, occasional urgency, and some leakage at night and less frequently during the day - these symptoms have been helped a bit with Myrbetriq, but still wake her at night.    Patient's problem list, allergies, and medications were reviewed and updated as appropriate.  Please see the EHR for full details.    Exam and data reviewed:  She looks well, is alert and attentive, with normal language.  Visual fields are full to confrontation, ocular versions full without nystagmus, facial sensation expression symmetric, no dysarthria, shrug is even, tongue is midline.  She has no tremor or pronator drift, movements are symmetric, and she has no dysmetria on finger-to-nose or finger taps.  She stands from a chair without using her arms, and is moderately unsteady when balancing on either foot or with tandem gait.    I reviewed her brain MRI from 11/05/2008 that showed moderately low lesion burden in the brain, with lesions including callosal (ovoid, perpendicular), periventricular, subcortical and juxtacortical.  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  In outside imaging report from 08/12/2016 indicated stable lesion burden in the brain, and no lesions in the cervical spinal cord.    Assessment & Plan:  Multiple sclerosis that has been clinically stable for many years on glatiramer acetate, with cogntiive symptoms that may exacerbated by poor sleep due to her overnight urinary symptoms, and neuropathic pain and spams that could be better controlled.    -  continue glatiramer acetate 40 mg three times per week  - increase Myrbetriq to 50 mg/day for bladder symptoms. If syptoms persist, would plan referral to urology.  - certify for medical marijuana for neuropathic pain and spasms  - MRI brain to monitor for disease activity - in spring with valium 5 mg (3 tabs) for procedure  - Follow up in May/June after MRI    Charlann Lange, MD  _________________________  I personally spent 45 minutes on the calendar  day of the encounter, including pre and post visit work.  Consent was obtained from the patient to complete this video visit; including the potential for financial liability.

## 2019-07-20 ENCOUNTER — Encounter: Payer: Self-pay | Admitting: Neurology

## 2019-07-20 DIAGNOSIS — G35 Multiple sclerosis: Secondary | ICD-10-CM

## 2019-07-23 ENCOUNTER — Other Ambulatory Visit: Payer: Self-pay

## 2019-07-23 DIAGNOSIS — F32A Depression, unspecified: Secondary | ICD-10-CM

## 2019-07-23 MED ORDER — BUPROPION HCL 300 MG PO TB24 *I*
300.0000 mg | ORAL_TABLET | Freq: Every morning | ORAL | 3 refills | Status: DC
Start: 2019-07-23 — End: 2020-04-22

## 2019-07-23 MED ORDER — MIRABEGRON ER 50 MG PO TB24 *I*
50.0000 mg | ORAL_TABLET | Freq: Every day | ORAL | 11 refills | Status: DC
Start: 2019-07-23 — End: 2020-06-18

## 2019-07-23 NOTE — Telephone Encounter (Signed)
Prescription updated per last visit note.  Please advise on status of medical marijuana card.    Date of last visit and plan for FUV:  07/12/2019, May/June after MRI  Date of FUV? Not scheduled    Date of last MRI and plan for next: 08/12/2016, May/June 2021  Is MRI ordered? No  DMT (include alternate dosing if applicable): Glatiramer  Labs:      Lab results: 06/13/19  1201   Sodium 139   Potassium 4.2   Chloride 102   CO2 27   UN 10   Creatinine 0.82   GFR,Caucasian 85   GFR,Black 98   Glucose 84   Calcium 9.6             Lab results: 06/13/19  1201   Total Protein 6.6   Albumin 4.5   ALT 28   AST 18   Alk Phos 78   Bilirubin,Total 0.4

## 2019-07-23 NOTE — Telephone Encounter (Signed)
Last appt: 06/15/2019     Next appt:  10/12/2019        Recent Lab Values 02/17/2018   EXP DATE 04/06/18

## 2019-08-21 ENCOUNTER — Encounter: Payer: Self-pay | Admitting: Gastroenterology

## 2019-08-26 ENCOUNTER — Other Ambulatory Visit: Payer: Self-pay | Admitting: Pulmonary Disease

## 2019-08-26 DIAGNOSIS — Z23 Encounter for immunization: Secondary | ICD-10-CM

## 2019-08-31 ENCOUNTER — Encounter: Payer: Self-pay | Admitting: Primary Care

## 2019-09-04 ENCOUNTER — Encounter: Payer: Self-pay | Admitting: Primary Care

## 2019-09-09 ENCOUNTER — Encounter: Payer: Self-pay | Admitting: Neurology

## 2019-09-09 DIAGNOSIS — G35 Multiple sclerosis: Secondary | ICD-10-CM

## 2019-09-10 MED ORDER — AMANTADINE HCL 100 MG PO TABS *A*
100.0000 mg | ORAL_TABLET | Freq: Two times a day (BID) | ORAL | 5 refills | Status: DC
Start: 2019-09-10 — End: 2019-09-12

## 2019-09-10 NOTE — Telephone Encounter (Signed)
Pt is requesting that we start prescribing amantadine since she takes for M.S.     Date of last visit and plan for FUV:  07/12/19 - May/June 2021  Date of FUV? Not yet scheduled  Date of last MRI and plan for next:  2018  Is MRI ordered?  Yes, but expired.    DMT (include alternate dosing if applicable):  Copaxone  Labs:      Lab results: 06/13/19  1201   Sodium 139   Potassium 4.2   Chloride 102   CO2 27   UN 10   Creatinine 0.82   GFR,Caucasian 85   GFR,Black 98   Glucose 84   Calcium 9.6             Lab results: 06/13/19  1201   Total Protein 6.6   Albumin 4.5   ALT 28   AST 18   Alk Phos 78   Bilirubin,Total 0.4

## 2019-09-12 ENCOUNTER — Telehealth: Payer: Self-pay | Admitting: Neurology

## 2019-09-12 DIAGNOSIS — G35 Multiple sclerosis: Secondary | ICD-10-CM

## 2019-09-12 NOTE — Telephone Encounter (Signed)
Pharmacy called to state that the directions on the amantadine dont make sense to them and they need a corrected script sent over.    Please advise.  [phone]

## 2019-09-12 NOTE — Telephone Encounter (Signed)
There were two different directions on prescription that was sent to pharmacy yesterday.  Called pt to confirm dosing.  She is currently taking amantadine 100 mg 2 tablets BID.  Order changed to reflect current dose and sent to NP pool for signature.

## 2019-09-13 MED ORDER — AMANTADINE HCL 100 MG PO TABS *A*
200.0000 mg | ORAL_TABLET | Freq: Two times a day (BID) | ORAL | 5 refills | Status: DC
Start: 2019-09-13 — End: 2019-10-13

## 2019-09-17 ENCOUNTER — Other Ambulatory Visit: Payer: Self-pay

## 2019-09-17 MED ORDER — TIZANIDINE HCL 4 MG PO TABS *I*
4.0000 mg | ORAL_TABLET | Freq: Three times a day (TID) | ORAL | 4 refills | Status: DC | PRN
Start: 2019-09-17 — End: 2020-01-14

## 2019-09-17 NOTE — Telephone Encounter (Signed)
Last office visit:   06/15/2019  Patients upcoming appointments:  Future Appointments   Date Time Provider [place]   10/12/2019  2:00 PM Johny Drilling, MD [facility] None       Recent Lab Values 02/17/2018   EXP DATE 04/06/18       Recent Lab results:  GENERAL CHEMISTRY   Recent Labs     06/13/19  1201   NA 139   K 4.2   CL 102   CO2 27   GAP 10   UN 10   CREAT 0.82   GFRC 85   GFRB 98   GLU 84   CA 9.6      LIPID PROFILE   Recent Labs     06/13/19  1201   CHOL 179   TRIG 45   HDL 73*   LDLC 97      LIVER PROFILE   Recent Labs     06/13/19  1201   ALT 28   AST 18   ALK 78   TB 0.4      DIABETES THYROID   Recent Labs     06/13/19  1201   HA1C 5.1    No value within the past 365 days      Pending/Orders Labs:  Lab Frequency Next Occurrence

## 2019-09-25 ENCOUNTER — Telehealth: Payer: Self-pay

## 2019-09-25 NOTE — Telephone Encounter (Signed)
Pt dropped off [employer] Statement of Continued Disability to be completed by provider, no Release of Information was obtained as pt wished to pick up this documentation. Please contact [phone] when complete. Placed in nurses bin.

## 2019-09-26 NOTE — Telephone Encounter (Signed)
Form reviewed and put in Dr.Moore inbox for completion

## 2019-09-28 NOTE — Telephone Encounter (Addendum)
Saved for 5/7 appt - please add 'form in nursing folder' to reason for service on that date

## 2019-10-11 ENCOUNTER — Ambulatory Visit: Payer: Medicare (Managed Care) | Admitting: Primary Care

## 2019-10-12 ENCOUNTER — Ambulatory Visit: Payer: Medicare (Managed Care) | Admitting: Primary Care

## 2019-10-12 ENCOUNTER — Encounter: Payer: Self-pay | Admitting: Gastroenterology

## 2019-10-12 ENCOUNTER — Encounter: Payer: Self-pay | Admitting: Primary Care

## 2019-10-12 VITALS — BP 136/90 | HR 98 | Temp 97.7°F | Ht 60.0 in | Wt 169.0 lb

## 2019-10-12 DIAGNOSIS — F32A Depression, unspecified: Secondary | ICD-10-CM

## 2019-10-12 DIAGNOSIS — Z0000 Encounter for general adult medical examination without abnormal findings: Secondary | ICD-10-CM

## 2019-10-12 DIAGNOSIS — F329 Major depressive disorder, single episode, unspecified: Secondary | ICD-10-CM

## 2019-10-12 DIAGNOSIS — G35 Multiple sclerosis: Secondary | ICD-10-CM

## 2019-10-12 DIAGNOSIS — Z23 Encounter for immunization: Secondary | ICD-10-CM

## 2019-10-12 DIAGNOSIS — R030 Elevated blood-pressure reading, without diagnosis of hypertension: Secondary | ICD-10-CM

## 2019-10-12 DIAGNOSIS — R4189 Other symptoms and signs involving cognitive functions and awareness: Secondary | ICD-10-CM

## 2019-10-12 MED ORDER — DIAZEPAM 5 MG PO TABS *I*
ORAL_TABLET | ORAL | 0 refills | Status: DC
Start: 2019-10-12 — End: 2021-01-12

## 2019-10-12 MED ORDER — DULOXETINE HCL 30 MG PO CPEP *I*
30.0000 mg | DELAYED_RELEASE_CAPSULE | Freq: Every day | ORAL | 1 refills | Status: DC
Start: 2019-10-12 — End: 2020-03-24

## 2019-10-12 NOTE — Telephone Encounter (Signed)
The form has been completed and mailed. I have placed a release and a copy of the form in scanning.

## 2019-10-12 NOTE — Progress Notes (Signed)
Visit performed as:             Office Visit, met with patient in person    Today we reviewed and updated Jodi Butler smoking status, activities of daily living, depression screen, fall risk, medications and allergies.   I have counseled the patient in the above areas.     Subjective:     Chief Complaint: Jodi Butler is a 49 y.o. female here for a/an Subsequent Annual Medicare Visit    In general, Liam Graham rates their overall health as:  fair      Patient Care Team:  Johny Drilling, MD as PCP - Maudie Flakes, MD  Arvil Persons as [place] - Internal  Jannifer Hick, Dani Gobble, MD as Obstetrician (Obstetrics and Gynecology)  Jorja Loa, Brooks Sailors, MD (Neurology)  Charlann Lange, MD as Consulting Provider (Neurology)     Current Outpatient Medications on File Prior to Visit   Medication Sig Dispense Refill    amantadine (SYMMETREL) 100 MG tablet Take 2 tablets (200 mg total) by mouth 2 times daily 120 tablet 5    mirabegron (MYRBETRIQ) 50 MG 24 hr tablet Take 1 tablet (50 mg total) by mouth daily 30 tablet 11    buPROPion (WELLBUTRIN XL) 300 MG 24 hr tablet Take 1 tablet (300 mg total) by mouth every morning Swallow whole. Do not crush, break, or chew. 90 tablet 3    cetirizine (ZYRTEC) 10 MG tablet Take 1 tablet (10 mg total) by mouth daily 90 tablet 1    escitalopram (LEXAPRO) 10 MG tablet Take 1 tab daily 90 tablet 3    DULoxetine (CYMBALTA) 60 MG DR capsule Take 1 capsule (60 mg total) by mouth daily 90 capsule 3    glatiramer (GLATOPA) 40 mg/mL prefilled syringe INJECT CONTENTS OF 1 SYRINGE UNDER THE SKIN THREE TIMES A WEEK 36 mL 3    fluticasone (FLONASE) 50 MCG/ACT nasal spray SPRAY 1 SPRAY INTO EACH NOSTRIL ONE TIME DAILY 16 mL 1    tiZANidine (ZANAFLEX) 4 MG tablet Take 1 tablet (4 mg total) by mouth every 8 hours as needed for Muscle spasms 90 tablet 4    carbamide peroxide (DEBROX) 6.5 % otic solution Place 5 drops into both ears 2 times daily 15 mL 0     triamcinolone (KENALOG) 0.1 % ointment Apply cream/ointment to areas on hands. 30 g 3    ibuprofen (ADVIL,MOTRIN) 200 MG tablet Take 400 mg by mouth 2 times daily as needed for Pain       No current facility-administered medications on file prior to visit.     Allergies   Allergen Reactions    No Known Drug Allergy      Created by Conversion - 0;      Patient Active Problem List    Diagnosis Date Noted    Patient has healthcare proxy 10/10/2018     Jodi Butler is the primary.  On file in scanned media.      Neurogenic bladder 07/20/2018     myrbetriq started 07/2018      DJD (degenerative joint disease), lumbar 06/27/2018     L2/3 small L disc protrusion with mild/mod L NF encroachment, L4/5 L disc protrusion with moderate NF encroachment      DJD (degenerative joint disease), cervical 08/18/2016     Mild stenosis, b/l NF narrowing at C6/7, R progressed compared to 2016.   Mod/severe L NF narrowing at C5/6  History of echocardiogram 08/04/2016     08/03/2016, Stress Echo with Dobutamine  Negative dobutamine ECHO for ischemia  No ischemic EKG changes with dobutamine  Normal resting LVEF 60%  Minimal chest discomfort at baseline; no significant change reported with stress.         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 Previously on Sertraline, fluoxetine, paxil.  Starting nuvigil by neuro for fatigue sx, stopped it due to cost.      Multiple sclerosis 03/15/2006     Relapsing remitting multiple sclerosis with ongoing symptoms with fatigue and spasticity. On Copaxone. Previously seen by [facility] (8/12). Seen by Dr. Archie Balboa.   Diagnosed in 2004, had sx of double vision.   Relapse 11/2014 in setting of UTI, rec'd prednisone taper and IV steroid infusion  ?relapse 08/2016 with sx of chest tightness, seen by neuro, did IV steroid infusion  MRI brain and cspine 08/2016 stable  12/19 L spine MRI ordered, amantandine increased to 263m BID  1/20 MRI  L spine without new lesions  C/o cognitive changes 2020, getting MRI brain and recommended to have sleep study       Past Medical History:   Diagnosis Date    Anxiety 2004    Depression     MS (multiple sclerosis)      Past Surgical History:   Procedure Laterality Date    APPENDECTOMY       Family History   Problem Relation Age of Onset    Multiple Sclerosis Father     Stroke Maternal Grandmother     Alzheimer's disease Maternal Grandmother     Hypertension Mother     Arthritis Mother     Breast cancer Paternal AAunt 40   Breast cancer Paternal Grandmother     Cancer Paternal Grandmother     Diabetes Paternal Grandmother     Lung cancer Paternal Uncle     Anxiety disorder Maternal Aunt     Bleeding problems Maternal Aunt     Blood clots Maternal Aunt     Depression Maternal Aunt     Cancer Paternal Aunt     Cancer Paternal Uncle     Cancer Paternal Grandfather      Social History     Socioeconomic History    Marital status: Married     Spouse name: Not on file    Number of children: Not on file    Years of education: Not on file    Highest education level: Not on file   Occupational History    Not on file   Tobacco Use    Smoking status: Former Smoker     Packs/day: 1.00     Years: 5.00     Pack years: 5.00     Types: Cigarettes     Quit date: 06/07/1996     Years since quitting: 23.3    Smokeless tobacco: Never Used   Substance and Sexual Activity    Alcohol use: Yes     Comment: Only special occasions    Drug use: No    Sexual activity: Yes     Partners: Female     Birth control/protection: None   Social History Narrative    Lives with wife JBethena Roysand 2 dogs. Permanently disabled for multiple sclerosis.        Objective:     Vital Signs: BP 142/86 (BP Location: Right arm, Patient Position: Sitting)  Pulse 98    Temp 36.5 C (97.7 F)    Ht 1.524 m (5')    Wt 76.7 kg (169 lb)    SpO2 98%    BMI 33.01 kg/m    BMI: Body mass index is 33.01 kg/m.    Vision Screening Results (Welcome  visit only):  No exam data present    Depression Screening Results:  Recent Review Flowsheet Data     PHQ Scores 10/12/2019 04/09/2019 10/10/2018 06/15/2018 01/16/2018 08/30/2016    PSQ2 Q1 - Interest/Pleasure Y _0     PSQ2 Q2 - Down, Depressed, Hopeless - _1     PHQ Q9 - Better Off Dead - 0 - 0 - -    PHQ Calculated Score - 6 - 6 - -        Opioid Use/DAST- 10 Screening Results:   How many times in the past year have you used an illegal drug or used a prescription medication for nonmedical reasons?: 0 (10/12/2019  2:07 PM)    Activities of Daily Living/Functional Screening Results:  Is the person deaf or does he/she have serious difficulty hearing?: N  Is this person blind or does he/she have serious difficulty seeing even when wearing glasses?: N  *Vision Status: Visual aid   Does this person have serious difficulty walking or climbing stairs?: Y  *Walks in Home: Independent  *Climbing Stairs: Needs Assistance  Does this person have difficulty dressing or bathing?: N  *Shopping: Needs Assistance  *House Keeping: Needs Assistance  *Managing Own Medications: Independent  *Handling Finances: Needs Assistance  Difficulty doing errands due to a physicial, mental or emotional condition: No  Difficulty remembering or making decisions due to a physicial, mental or emotional condition: Yes      Fall Risk Screening Results:  Have you fallen in the last year?: No  Do you feel you are at risk for falling?: No      Assessment and Plan:     Cognitive Function:  Recall of recent and remote events appears:  Abnormal - .     Doesn't recall phone conversations with her mother  Asks same thing 2-3x a day  Wife notices she cannot understand instructions (ex. Putting together a bedframe)  Forgets the dogs are outside  Difficulty getting dinner prepared (preparing or listening to instructions to help wife)    Wife notices getting side tracked, forgets she started making wife's coffee.     Increased frustration and anger at these  lapses  Multiple projects started/stopped      Advanced Care Planning:  was discussed and the paperwork can be found in the scanned media section     The following health maintenance plan was reviewed with the patient:    Health Maintenance Topics with due status: Overdue       Topic Date Due    HIV Screening USPSTF/[place] Never done     Health Maintenance Topics with due status: Not Due       Topic Last Completion Date    Cervical Cancer Screening Other 06/15/2018    Breast Cancer Screening Other 06/28/2018    DEPRESSION SCREEN YEARLY 04/09/2019     Health Maintenance Topics with due status: Completed       Topic Last Completion Date    IMM-INFLUENZA 04/09/2019    Hepatitis C Screening 06/13/2019    COVID-19 Vaccine 08/31/2019     This health maintenance schedule, identified risks, a list of orders placed today  and patient goals have been provided to Liam Graham in the after visit summary.     Plan for any concerns identified during screening or risk assessments:  n/a

## 2019-10-12 NOTE — Progress Notes (Signed)
[facility]          SUBJECTIVE    Pt is here to discuss:    Chief Complaint   Patient presents with    Subsequent Annual Medicare Visit         1. Mult Sclerosis - increasing urinary incontnence, hand shaking, cognitive changes. Wife gives examples:   Doesn't recall phone conversations with her mother  Asks same thing 2-3x a day  Wife notices she cannot understand instructions (ex. Putting together a bedframe)  Forgets the dogs are outside  Difficulty getting dinner prepared (preparing or listening to instructions to help wife)    Wife notices getting side tracked, forgets she started making wife's coffee.     Increased frustration and anger at these lapses  Multiple projects started/stopped    Has voiced anger/irritability and once that she'd be better off dead. Today she states this was out of frustration and doesn't have actual SI. Does feel frustrated that she's losing her independence; wife fearful of worsening depression and potential for SI.     Has appt with Neuro at end of June    2. Elevated blood pressure -   Does not check BP as outpatient.     BP Readings from Last 3 Encounters:   10/12/19 136/90   06/15/19 (!) 146/92   04/09/19 122/64              PMH / Family Hx / Social Hx  Patient's medications, allergies, problem list, past medical, social histories were reviewed and notable for:    Current Outpatient Medications   Medication Sig    amantadine (SYMMETREL) 100 MG tablet Take 2 tablets (200 mg total) by mouth 2 times daily    mirabegron (MYRBETRIQ) 50 MG 24 hr tablet Take 1 tablet (50 mg total) by mouth daily    buPROPion (WELLBUTRIN XL) 300 MG 24 hr tablet Take 1 tablet (300 mg total) by mouth every morning Swallow whole. Do not crush, break, or chew.    cetirizine (ZYRTEC) 10 MG tablet Take 1 tablet (10 mg total) by mouth daily    escitalopram (LEXAPRO) 10 MG tablet Take 1 tab daily    DULoxetine (CYMBALTA) 60 MG DR capsule Take 1 capsule (60 mg total) by mouth daily     glatiramer (GLATOPA) 40 mg/mL prefilled syringe INJECT CONTENTS OF 1 SYRINGE UNDER THE SKIN THREE TIMES A WEEK    fluticasone (FLONASE) 50 MCG/ACT nasal spray SPRAY 1 SPRAY INTO EACH NOSTRIL ONE TIME DAILY    tiZANidine (ZANAFLEX) 4 MG tablet Take 1 tablet (4 mg total) by mouth every 8 hours as needed for Muscle spasms    carbamide peroxide (DEBROX) 6.5 % otic solution Place 5 drops into both ears 2 times daily    triamcinolone (KENALOG) 0.1 % ointment Apply cream/ointment to areas on hands.    ibuprofen (ADVIL,MOTRIN) 200 MG tablet Take 400 mg by mouth 2 times daily as needed for Pain      ROS:  No vision changes  No additional hypoglycemic episodes    OBJECTIVE  Vitals:    10/12/19 1409 10/12/19 1437   BP: 142/86 136/90   BP Location: Right arm    Patient Position: Sitting    Pulse: 98    Temp: 36.5 C (97.7 F)    SpO2: 98%    Weight: 76.7 kg (169 lb)    Height: 1.524 m (5')      Body mass index is 33.01 kg/m.  General: well-appearing Caucasian female, pleasant & conversant, in NAD, here with wife and care manager Sonia Baller  Eyes:. PERRLA. EOMI. Conjunctiva pink, without swelling or exudate. Lids normal.   ENT:   B/L TMs obscured by dry cerumen   Lungs: Normal resp effort. CTAB.  No crackles or wheezes.  Cardiac: RRR, no M/R/G.   Neuro: CNs grossly intact. Strength in UE and LE 5/5 b/l. DTRs 2+ throughout. +clonus at b/l ankles x3 beats.  Cerebellar testing intact, no intention tremor. Mild resting tremor R>L, more noticeable with fingers outstretched and showing it to me.   Psych: AAOx3, normal affect and mood. Insight and judgement intact.     Recent Review Flowsheet Data     PHQ Scores 10/12/2019 10/12/2019 04/09/2019 10/10/2018 06/15/2018 01/16/2018 08/30/2016    PSQ2 Q1 - Interest/Pleasure Y Y N N N N N    PSQ2 Q2 - Down, Depressed, Hopeless Y - N N N N N    PHQ Q9 - Better Off Dead 1 - 0 - 0 - -    PHQ Calculated Score 15 - 6 - 6 - -              ASSESSMENT & PLAN  1. Multiple sclerosis  Concern for interval  progression, given new tremor, increased urinary incontinence, paresthesias of right upper extremity and cognitive abnormalities.  Recommended updating imaging prior to neurology appointment at the end of June. Neuro made aware.    - MR head without contrast; Future  - MR cervical spine without and with contrast; Future  - diazePAM (VALIUM) 5 MG tablet; Take 1 tab 1 hour prior to procedure, repeat 49min later if still anxious. MDD 10mg   Dispense: 2 tablet; Refill: 0    2. Depression, unspecified depression type  3. Cognitive changes  Discussed that cognitive changes may be a symptom of untreated depression or anxiety, but also that depression may be caused by the cognitive decline.    Discussed medication regimen changes, and recent addition of Lexapro to Cymbalta in November.  I recommended getting her on 1 serotonin agent instead of 2, and therefore we opted to maximize the Cymbalta to 90 mg daily.  Patient will stop the Lexapro dose.  If max dose of Cymbalta ineffective, we can then consider transitioning to SSRI.  She was nervous today about losing the neuropathic pain relief of her SNRI, so I encouraged her to discuss other options with neurology when she has follow-up in June.        - DULoxetine (CYMBALTA) 30 MG DR capsule; Take 1 capsule (30 mg total) by mouth daily Take with 60mg  dose to total 90mg  daily. Replacing lexapro 10mg , cancel other refills.  Dispense: 90 capsule; Refill: 1         4. Elevated blood pressure reading without diagnosis of hypertension  Requested home monitoring, will follow up in 1 week via MyChart    5. Preventative health care  AWV completed        Follow-up: 3 months re: Ernst Bowler and mood      Johny Drilling, MD  [place]  10/12/2019  2:20 PM        ______________________

## 2019-10-12 NOTE — Patient Instructions (Addendum)
Thank you for completing your Subsequent Annual Medicare Visit   with [place] today.     The purpose of this visits was to:     Screen for disease   Assess risk of future medical problems   Help develop a healthy lifestyle   Update vaccines   Get to know your doctor in case of an illness    Patient Care Team:  Farrell Ours, MD as PCP - Charlyne Quale, MD  Elinor Dodge as Health Home Care Manager - Internal  Pershing Proud, Oswaldo Conroy, MD as Obstetrician (Obstetrics and Gynecology)  Elroy Channel, MD (Neurology)     Medicare 5 Year Plan    The following items were identified as areas of concern during your screening today:  BMI greater than 25 - This is a risk for Heart Attack, Stroke, High Blood Pressure, Diabetes, High Cholesterol and other complications.       The Health Maintenance table below identifies screening tests and immunizations recommended by your health care team:  Health Maintenance: These screening recommendations are based on USPSTF, [employer], and [place] state guidelines   Topic Date Due    HIV Screening  Never done    DEPRESSION SCREEN YEARLY  04/08/2020    Breast Cancer Screening  06/28/2020    Cervical Cancer Screening  06/16/2023    Flu Shot  Completed    Hepatitis C Screening  Completed    COVID-19 Vaccine  Completed     In addition, goals and orders placed to address these recommendations are listed in the "Today's Visit" section.    We wish you the best of health and look forward to seeing you again next year for your Annual Medicare Wellness Visit.     If you have any health care concerns before then, please do not hesitate to contact us.    [facility]  [phone] opt #[phone]  Please contact their office to schedule your MRIs

## 2019-10-13 ENCOUNTER — Other Ambulatory Visit: Payer: Self-pay | Admitting: Adult Health

## 2019-10-13 DIAGNOSIS — G35 Multiple sclerosis: Secondary | ICD-10-CM

## 2019-10-15 MED ORDER — AMANTADINE HCL 100 MG PO TABS *A*
200.0000 mg | ORAL_TABLET | Freq: Two times a day (BID) | ORAL | 5 refills | Status: DC
Start: 2019-10-15 — End: 2020-06-30

## 2019-10-16 ENCOUNTER — Encounter: Payer: Self-pay | Admitting: Gastroenterology

## 2019-10-16 ENCOUNTER — Telehealth: Payer: Self-pay

## 2019-10-16 NOTE — Telephone Encounter (Signed)
We received via fax a CDPAS form.  The patient was last in on 10/12/19. I have placed the form in the nurses bin for processing.

## 2019-10-16 NOTE — Telephone Encounter (Signed)
Form reviewed and put in Dr.Moore inbox for completion and signature

## 2019-10-17 NOTE — Telephone Encounter (Signed)
The form has been completed. I faxed the forms and placed a copy in scanning.

## 2019-11-23 ENCOUNTER — Ambulatory Visit
Admission: RE | Admit: 2019-11-23 | Discharge: 2019-11-23 | Disposition: A | Discharge status: Home / Self Care | Payer: Medicare (Managed Care) | Source: Ambulatory Visit

## 2019-11-23 ENCOUNTER — Ambulatory Visit
Admission: RE | Admit: 2019-11-23 | Discharge: 2019-11-23 | Disposition: A | Discharge status: Home / Self Care | Payer: Medicare (Managed Care) | Source: Ambulatory Visit | Attending: Primary Care | Admitting: Primary Care

## 2019-11-23 DIAGNOSIS — M4802 Spinal stenosis, cervical region: Secondary | ICD-10-CM | POA: Insufficient documentation

## 2019-11-23 DIAGNOSIS — M47812 Spondylosis without myelopathy or radiculopathy, cervical region: Secondary | ICD-10-CM

## 2019-11-23 DIAGNOSIS — G35 Multiple sclerosis: Secondary | ICD-10-CM

## 2019-11-23 MED ORDER — GADOTERIDOL 279.3 MG/ML (PROHANCE) IV SOLN *I*
15.0000 mL | Freq: Once | INTRAVENOUS | Status: AC
Start: 2019-11-23 — End: 2019-11-23
  Administered 2019-11-23: 15 mL via INTRAVENOUS

## 2019-11-30 ENCOUNTER — Other Ambulatory Visit: Payer: Self-pay | Admitting: Gastroenterology

## 2019-12-03 ENCOUNTER — Other Ambulatory Visit
Admission: RE | Admit: 2019-12-03 | Discharge: 2019-12-03 | Disposition: A | Discharge status: Home / Self Care | Payer: Medicare (Managed Care) | Source: Ambulatory Visit | Attending: Neurology | Admitting: Neurology

## 2019-12-03 ENCOUNTER — Encounter: Payer: Self-pay | Admitting: Neurology

## 2019-12-03 ENCOUNTER — Ambulatory Visit: Payer: Medicare (Managed Care) | Attending: Neurology | Admitting: Neurology

## 2019-12-03 VITALS — BP 143/79 | HR 83 | Temp 99.0°F | Wt 161.0 lb

## 2019-12-03 DIAGNOSIS — G35 Multiple sclerosis: Secondary | ICD-10-CM

## 2019-12-03 DIAGNOSIS — M549 Dorsalgia, unspecified: Secondary | ICD-10-CM | POA: Insufficient documentation

## 2019-12-03 LAB — COMPREHENSIVE METABOLIC PANEL
ALT: 20 U/L (ref 0–35)
AST: 15 U/L (ref 0–35)
Albumin: 4.6 g/dL (ref 3.5–5.2)
Alk Phos: 65 U/L (ref 35–105)
Anion Gap: 14 (ref 7–16)
Bilirubin,Total: 0.5 mg/dL (ref 0.0–1.2)
CO2: 24 mmol/L (ref 20–28)
Calcium: 9.8 mg/dL (ref 8.6–10.2)
Chloride: 102 mmol/L (ref 96–108)
Creatinine: 0.82 mg/dL (ref 0.51–0.95)
GFR,Black: 98 *
GFR,Caucasian: 85 *
Glucose: 109 mg/dL — ABNORMAL HIGH (ref 60–99)
Lab: 10 mg/dL (ref 6–20)
Potassium: 3.9 mmol/L (ref 3.3–5.1)
Sodium: 140 mmol/L (ref 133–145)
Total Protein: 7 g/dL (ref 6.3–7.7)

## 2019-12-03 LAB — CBC AND DIFFERENTIAL
Baso # K/uL: 0.1 10*3/uL (ref 0.0–0.1)
Basophil %: 0.9 %
Eos # K/uL: 0.2 10*3/uL (ref 0.0–0.4)
Eosinophil %: 3.4 %
Hematocrit: 43 % (ref 34–45)
Hemoglobin: 14.1 g/dL (ref 11.2–15.7)
IMM Granulocytes #: 0 10*3/uL (ref 0.0–0.0)
IMM Granulocytes: 0.2 %
Lymph # K/uL: 1.8 10*3/uL (ref 1.2–3.7)
Lymphocyte %: 27.9 %
MCH: 32 pg (ref 26–32)
MCHC: 33 g/dL (ref 32–36)
MCV: 96 fL — ABNORMAL HIGH (ref 79–95)
Mono # K/uL: 0.4 10*3/uL (ref 0.2–0.9)
Monocyte %: 6.1 %
Neut # K/uL: 4 10*3/uL (ref 1.6–6.1)
Nucl RBC # K/uL: 0 10*3/uL (ref 0.0–0.0)
Nucl RBC %: 0 /100 WBC (ref 0.0–0.2)
Platelets: 232 10*3/uL (ref 160–370)
RBC: 4.4 MIL/uL (ref 3.9–5.2)
RDW: 12.5 % (ref 11.7–14.4)
Seg Neut %: 61.5 %
WBC: 6.5 10*3/uL (ref 4.0–10.0)

## 2019-12-03 MED ORDER — NAPROXEN 250 MG PO TABS *I*
250.0000 mg | ORAL_TABLET | Freq: Two times a day (BID) | ORAL | 0 refills | Status: AC
Start: 2019-12-03 — End: 2019-12-17

## 2019-12-03 NOTE — Progress Notes (Signed)
I saw Jodi Butler in follow up for her multiple sclerosis, along with her wife [name]. She is a 49 y.o.who had an attack of diplopia and left facial weakness in 1998 with a lesion in the pons, and was diagnosed with MS in 2003 after an attack of bilateral leg weakness and sensory symptoms, with new brain lesions typical of MS and CSF with oligoclonal bands.  She started Copaxone in 2004 and has continued it since, eventually transitioning to 40 mg three times per week dosing and generic (Glatopa). She has not had further attacks but has struggled with faitigue and cognitive symptoms (due to which she is on disability), and spasms.    She had her MRI 10 days ago and has had pain in her mid/left back since, that worsened during positioning for the procedure and has persisted since - hard to sleep, hurts all the time (not worse with activity), CBD/THC oil helped a bit, hasn't tried much else.  She's had back pain before this, but never so severe     She's had episodic tightening spasms in her chest for 9+ years.  She's had intermittent numbness in her L arm (not new) and in the past 2 months has had more shaking in both hands (intermittent) - when holding a glass, she notices this mostly in her right hand (she's R handed), she feels it's not a problem with utensils or hand but [name] says it's sometimes apparent when using a fork.  She sometimes knocks things over (iIntermittent - no pattern).  At night [name] says she sometimes has spasms in her legs (like a fish out of water) and whole body jerks as well (that are new in the past couple months).  No spasms during the day.  Tizanidine (4 mg) helps spasms but causes headache so she uses it only when absolutely needed (baclofen was less tolerable in the past) - hasn't tried a lower dose.  Coordination has been getting progressively worse, and she's having more trouble with memory and comprehension - eg trying to assemble a bed, couldn't figure out the directions and  needed many different explanations;  [name] tells her things and she forgets the conversation;  poor sense of time between events.  Worsening over the past several years, more dramatic last 6 months.  Tripping over things more than before - not seeing that they're there.      She had her COVID vaccine in March.      Exam: She looks tired, and uncomfortable with movement.  She is alert and attentive, with normal language visual fields are full to confrontation, pupils equal, versions are choppy but without nystagmus, facial sensation and expression symmetric, shrug is even, tongue is midline.  She has no tremor or pronator drift, full strength throughout.  Sensation is well preserved to vibration at the toes, and intact to cold.  She has no ataxia with finger-to-nose or heel-to-shin.  Gait is antalgic due to her back pain, and she did not do a timed 25 foot walk today.    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  Her cervical spine MRI showed small scattered T2 lesions in the cervical cord and (C3-7) that were new since her 2009 scan, and a thin linear T2 signal from  C6-T2 (read as within the left hemicord, but to my eye looks more central and not clear if this is a lesion or slightly widened central canal), with perhaps some subtle enhancement.        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  She would likely benefit from higher-potency immunotherapy to better control her MS, to prevent development of new lesions, further relapses, and future disability.   As she has only been treated with low-potency glatiramer acetate, reasonable options for stronger treatment could include pills like dimethyl fumarate or fingolimod, or potentially higher-potency infusions such as natalizumab or ocrelizumab.  She will get screening bloodwork today to help inform the potential risks associated with these treatments, and we'll follow up in a 2 weeks to discuss further and choose.     In addition, given the development of a multi-segment linear T2 abnormality in the spinal cord (read as a likely demyelinating lesion though I'm not completely sure), it is reasonable to check serology for neuromyelitis optica and MOG antibody disease which would affect choice of treatment.    Her back pain since the MRI seems musculoskeletal, and will likely continue to improve.      Plan:  - bloodwork today for AQP4 and MOG serologies, and baseline/screening for higher potency immunotherapies (including CBC, metabolic panel and LFTs, lymphocyte subsets, JCV and VZV serology, and screening for TB, Hep B and C).  - Medical marijuana certificiation for pain and spasticity  - Continue glatiramer acetate for now  - Follow up in 2 weeks via telemedicine to discuss treatment choice    [name], MD  _____________________  I personally spent 40 minutes on the calendar day of the encounter, including pre and post visit work.

## 2019-12-04 LAB — LYMPHOCYTE SUBSET (T & B CELLS)
B LYM #(CD19): 255 cells/uL (ref 120–725)
B LYM %(CD19): 16 % (ref 7–36)
CD4#: 809 cells/uL (ref 496–2186)
CD4%: 50 % (ref 32–71)
CD4/CD8: 2.3 (ref 0.7–3.0)
CD8#: 346 cells/uL (ref 177–1137)
NK LYM #(CD16+56): 112 cells/uL (ref 37–758)
NK LYM %(CD16+56): 7 % (ref 4–26)
NK LYM# (CD3+16+56+): 27 cells/uL
NK LYM% (CD3+16+56+): 2 %
T LYM #(CD3): 1188 cells/uL (ref 754–2810)
T LYM %(CD3): 74 % (ref 54–87)
T Suppress %(CD8): 22 % (ref 10–38)

## 2019-12-04 LAB — HEPATITIS B & C PROF
HBV Core Ab: NEGATIVE
HBV S Ab Quant: 0.43 m[IU]/mL
HBV S Ab: NEGATIVE
HBV S Ag: NEGATIVE
Hep C Ab: NEGATIVE

## 2019-12-04 LAB — VARICELLA ZOSTER IGG AB: VZV IgG: NEGATIVE

## 2019-12-07 LAB — CNS DEMYELINATING DISEASE EVAL,S
MOG-IgG1: NEGATIVE
NMO/AQP4,SER: NEGATIVE

## 2019-12-11 LAB — STRATIFY JCV W/INDEX W/REFLEX TO INHIBITION
STJIX - Index Value: 2.54
Stratify JCV AB with reflex to inhibition: POSITIVE

## 2019-12-13 LAB — TB AG T-CELL STIMULATION: TB Ag T-Cell Stimulation: 0

## 2019-12-19 ENCOUNTER — Encounter: Payer: Self-pay | Admitting: Neurology

## 2019-12-19 ENCOUNTER — Ambulatory Visit: Payer: Medicare (Managed Care) | Attending: Neurology | Admitting: Neurology

## 2019-12-19 ENCOUNTER — Other Ambulatory Visit: Payer: Self-pay | Admitting: Family Medicine

## 2019-12-19 DIAGNOSIS — G35 Multiple sclerosis: Secondary | ICD-10-CM | POA: Insufficient documentation

## 2019-12-19 DIAGNOSIS — Z9109 Other allergy status, other than to drugs and biological substances: Secondary | ICD-10-CM

## 2019-12-19 NOTE — Telephone Encounter (Signed)
Last office visit:   10/12/2019  Patients upcoming appointments:  Future Appointments   Date Time Provider [place]   12/19/2019 12:00 PM Charlann Lange, MD [facility] None   01/14/2020  3:00 PM Johny Drilling, MD [facility] None   10/13/2020  1:00 PM Johny Drilling, MD [facility] None     Recent Lab results:  GENERAL CHEMISTRY   Recent Labs     12/03/19  1743 06/13/19  1201   NA 140 139   K 3.9 4.2   CL 102 102   CO2 24 27   GAP 14 10   UN 10 10   CREAT 0.82 0.82   GFRC 85 85   GFRB 98 98   GLU 109* 84   CA 9.8 9.6      LIPID PROFILE   Recent Labs     06/13/19  1201   CHOL 179   TRIG 45   HDL 73*   LDLC 97      LIVER PROFILE   Recent Labs     12/03/19  1743 06/13/19  1201   ALT 20 28   AST 15 18   ALK 65 78   TB 0.5 0.4      DIABETES THYROID   Recent Labs     06/13/19  1201   HA1C 5.1    No value within the past 365 days      Pending/Orders Labs:  Lab Frequency Next Occurrence        Recent Lab Values 02/17/2018   EXP DATE 04/06/18

## 2019-12-19 NOTE — Progress Notes (Signed)
Video Visit     Location of Patient: home  Location of Telemedicine Provider: home / other  Other participants in telemedicine encounter and roles:  [name] (spouse)   This is an established patient visit.  Reason for visit: Multiple Sclerosis    HPI  I saw Jazmene Racz in follow up for her multiple sclerosis, along with her wife [name] and her mother.  She is a 49 y.o. who had an attack of diplopia and left facial weakness in 1998 with a lesion in the pons, and was diagnosed with MS in 2003 after an attack of bilateral leg weakness and sensory symptoms, with new brain lesions typical of MS and CSF with oligoclonal bands.  She started Copaxone in 2004 and has continued it since, eventually transitioning to 40 mg three times per week dosing and generic (Glatopa).  She has not had further attacks but has struggled with faitigue and cognitive symptoms (due to which she is on disability), and spasms.  Brain lesions have been stable on MRI, but cervical spine MRI in June 2021 showed new lesions compared to prior scans in 2009, with perhaps subtle enhancement.      She follows up today to discuss a change in treatment given further disease activity on her cervical MRI in June.  Since her last visit her back pain that had worsened during her MRI has improved to the point that she's using naproxen intermittently, and is doing normal activities not feeling held back by the pain. No new symptoms. She has reviewed MS treatments and is interested in starting Tecfidera.     Patient's problem list, allergies, and medications were reviewed and updated as appropriate.  Please see the EHR for full details.    Exam and data reviewed:  She looks well, is alert and attentive with normal language and good recall.  Facial expression symmetric, no dysarthria.  Further video exam not done.    Bloodwork from 6/28 showed negative AQP4 and MOG serology, as well as negative Hep B,C and TB screening.  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    Assessment & Plan:  Multiple sclerosis with spinal cord activity on glatiramer acetate.  She would benefit from a switch to a higher-potency treatment, and after discussed options we agreed to try dimentyl fumarate (Tecfidera).  This is considerably more potent than glatiramer acetate and seems likely to better control her disease activity; we reviewed benefits and risks including immunosuppression and GI side effects, and she would like to proceed.    - Plan to switch to Tecfidera, pending insurance approval.  Enrollment form sent to her via MyChart, to fill, sign and return.  - She will continue glatiramer acetate until the switch.    - Medical marijuana certification form sent via MyChart, for spasms.  - Follow up in October (telemedicine or in-person)    [name], MD  ____________________________  Consent was obtained from the patient to complete this video visit; including the potential for financial liability.

## 2019-12-20 ENCOUNTER — Telehealth: Payer: Self-pay | Admitting: Neurology

## 2019-12-20 NOTE — Telephone Encounter (Signed)
Calling to schedule in person or video fuv with [facility] per checkout note:    Return in about 3 months (around 03/20/2020) for Telemedicine Follow-up or in-person (if telemed, then schedule in-person 6 mo later).    Left message tocall access ctr to sched.    In person times avail on 10/25 (3 and 4 pm) at this time,    Video fuvs avail on Oct 21 And Oct 28 if preferred.

## 2019-12-21 ENCOUNTER — Other Ambulatory Visit: Payer: Self-pay

## 2019-12-21 ENCOUNTER — Other Ambulatory Visit: Payer: Self-pay | Admitting: Neurology

## 2019-12-21 DIAGNOSIS — G35 Multiple sclerosis: Secondary | ICD-10-CM

## 2019-12-21 MED ORDER — DIMETHYL FUMARATE 120 & 240 MG PO MISC *A*
ORAL | 0 refills | Status: DC
Start: 2019-12-21 — End: 2020-01-21
  Filled 2019-12-21 – 2019-12-24 (×2): qty 60, 30d supply, fill #0

## 2019-12-21 NOTE — Telephone Encounter (Signed)
I dont see a recent CBC w/Diff does she need this prior to starting? I don't see one ordered as well.  Thanks

## 2019-12-21 NOTE — Telephone Encounter (Signed)
You can enter script, and send to [facility].  Thanks

## 2019-12-21 NOTE — Telephone Encounter (Signed)
Thanks not sure how I missed that!

## 2019-12-21 NOTE — Telephone Encounter (Signed)
Pt sent Tecfidera start form via MyChart attachment.  Will forward to Cassie.

## 2019-12-24 ENCOUNTER — Other Ambulatory Visit: Payer: Self-pay

## 2019-12-24 ENCOUNTER — Other Ambulatory Visit: Payer: Self-pay | Admitting: Neurology

## 2019-12-24 NOTE — Progress Notes (Signed)
We received Jodi Butler's prescription for Tecfidera starter pack. Confirmed with patient's insurance that she can fill at [facility]. Please specify [facility] as preferred pharmacy on service request form.     Prior authorization is not required.    Thank you,  Armando Reichert, PharmD  [facility]  [phone]

## 2019-12-24 NOTE — Telephone Encounter (Signed)
Looks like one has already be entered, is it correct or send two different rxs?

## 2019-12-24 NOTE — Progress Notes (Signed)
Spoke with Jodi Butler regarding prescription for Tecfidera for multiple sclerosis.    Initial assessment included review of interdisciplinary team documentation on physical health, social, environmental, economic, functional and mental components relevant to the patient's ability to adhere to the care plan with [place].    Administration, treatment goals, adherence strategies, side effects and management, handling missed doses, and storage were reviewed with the patient.     Administration instructions: Tecfidera 120 mg by mouth twice daily for 7 days then 272m by mouth twice daily    Planned Start Date: 12/31/19    Adherence plan: Combine with daily tasks    Baseline testing reviewed:       Lab results: 12/03/19  1743   Total Protein 7.0   Albumin 4.6   ALT 20   AST 15   Alk Phos 65   Bilirubin,Total 0.5         Lab results: 12/03/19  1743   WBC 6.5   Hemoglobin 14.1   Hematocrit 43   RBC 4.4   Platelets 232   Neut # K/uL 4.0   Lymph # K/uL 1.8   Mono # K/uL 0.4   Eos # K/uL 0.2   Baso # K/uL 0.1   Seg Neut % 61.5   Lymphocyte % 27.9   Monocyte % 6.1   Eosinophil % 3.4   Basophil % 0.9       Lab schedule: LFTs and CBC with differential every 3 months    Specialty Pharmacy: [facility] Delivery Date: 12/31/19; Copay: $0    Medication list reviewed and updated. No significant interactions identified. I advised her to check with a pharmacist before starting any herbal supplements, medications or over the counter products.    Current Outpatient Medications   Medication Sig    dimethyl fumarate (TECFIDERA STARTER PACK) 120 & 240 mg DR capsule Take 120 mg by mouth twice daily for 7 days, then take 240 mg by mouth twice daily thereafter    cetirizine (ZYRTEC) 10 MG tablet TAKE 1 TABLET BY MOUTH EVERY DAY    amantadine (SYMMETREL) 100 MG tablet Take 2 tablets (200 mg total) by mouth 2 times daily    DULoxetine (CYMBALTA) 30 MG DR capsule Take 1 capsule (30 mg total) by  mouth daily Take with 625mdose to total 9034maily. Replacing lexapro 46m60mancel other refills.    diazePAM (VALIUM) 5 MG tablet Take 1 tab 1 hour prior to procedure, repeat 30mi38mter if still anxious. MDD 46mg 35miZANidine (ZANAFLEX) 4 MG tablet Take 1 tablet (4 mg total) by mouth every 8 hours as needed for Muscle spasms    mirabegron (MYRBETRIQ) 50 MG 24 hr tablet Take 1 tablet (50 mg total) by mouth daily    buPROPion (WELLBUTRIN XL) 300 MG 24 hr tablet Take 1 tablet (300 mg total) by mouth every morning Swallow whole. Do not crush, break, or chew.    carbamide peroxide (DEBROX) 6.5 % otic solution Place 5 drops into both ears 2 times daily    DULoxetine (CYMBALTA) 60 MG DR capsule Take 1 capsule (60 mg total) by mouth daily    triamcinolone (KENALOG) 0.1 % ointment Apply cream/ointment to areas on hands.    fluticasone (FLONASE) 50 MCG/ACT nasal spray SPRAY 1 SPRAY INTO EACH NOSTRIL ONE TIME DAILY    ibuprofen (ADVIL,MOTRIN) 200 MG tablet Take 400 mg by mouth 2 times daily as needed for Pain  Plan to follow up with patient one week after starting. Encouraged her to call with any questions or concerns.    Thank you for allowing me to participate in the care of this patient. Please contact me with any questions.    Alycia Rossetti, PharmD, BCPS, [facility]  Phone: [phone]  Fax: [phone]

## 2019-12-24 NOTE — Telephone Encounter (Signed)
That's fine our SP will reach out when they need the second script Thanks

## 2019-12-25 ENCOUNTER — Other Ambulatory Visit: Payer: Self-pay

## 2019-12-25 NOTE — Progress Notes (Signed)
Its in mychart message I will stamp and send to you today

## 2020-01-07 ENCOUNTER — Encounter: Payer: Self-pay | Admitting: Primary Care

## 2020-01-07 NOTE — Progress Notes (Signed)
Spoke with Jodi Butler regarding follow up for Tecfidera for multiple sclerosis.     Administration instructions: Tecfidera 120 mg by mouth twice daily for one week then 240mg  by mouth twice daily     Side effects reported:    GI irritation - resolved with food and Pepto-bismal. Jodi Butler reports stomach upset is getting better with time.    Rash on arm - Jodi Butler reports rash resolved quickly with oral diphenhydramine. Unclear if due to new Tecfidera. If rash develops again, Jodi Butler will reach out. Overall, tolerating Tecfidera well.    Specialty Pharmacy: [facility]    Patient reports no changes to medications.    Current Outpatient Medications   Medication Sig    dimethyl fumarate (TECFIDERA STARTER PACK) 120 & 240 mg DR capsule Take 120 mg by mouth twice daily for 7 days, then take 240 mg by mouth twice daily thereafter    cetirizine (ZYRTEC) 10 MG tablet TAKE 1 TABLET BY MOUTH EVERY DAY    amantadine (SYMMETREL) 100 MG tablet Take 2 tablets (200 mg total) by mouth 2 times daily    DULoxetine (CYMBALTA) 30 MG DR capsule Take 1 capsule (30 mg total) by mouth daily Take with 60mg  dose to total 90mg  daily. Replacing lexapro 10mg , cancel other refills.    diazePAM (VALIUM) 5 MG tablet Take 1 tab 1 hour prior to procedure, repeat Melvenia Beam later if still anxious. MDD 10mg     tiZANidine (ZANAFLEX) 4 MG tablet Take 1 tablet (4 mg total) by mouth every 8 hours as needed for Muscle spasms    mirabegron (MYRBETRIQ) 50 MG 24 hr tablet Take 1 tablet (50 mg total) by mouth daily    buPROPion (WELLBUTRIN XL) 300 MG 24 hr tablet Take 1 tablet (300 mg total) by mouth every morning Swallow whole. Do not crush, break, or chew.    carbamide peroxide (DEBROX) 6.5 % otic solution Place 5 drops into both ears 2 times daily    DULoxetine (CYMBALTA) 60 MG DR capsule Take 1 capsule (60 mg total) by mouth daily    triamcinolone (KENALOG) 0.1 % ointment Apply cream/ointment to areas on hands.    fluticasone (FLONASE) 50  MCG/ACT nasal spray SPRAY 1 SPRAY INTO EACH NOSTRIL ONE TIME DAILY    ibuprofen (ADVIL,MOTRIN) 200 MG tablet Take 400 mg by mouth 2 times daily as needed for Pain       Plan to follow up with patient during next refill call on 01/21/20. Encouraged her to call with any questions or concerns.    Thank you for allowing me to participate in the care of this patient. Please contact me with any questions.      , PharmD  [facility]  Phone: [phone]  Fax: [phone]

## 2020-01-07 NOTE — Telephone Encounter (Signed)
Ok to change appt to zoom, see mychart and please reply to pt

## 2020-01-08 ENCOUNTER — Other Ambulatory Visit: Payer: Self-pay

## 2020-01-08 ENCOUNTER — Encounter: Payer: Self-pay | Admitting: Neurology

## 2020-01-08 NOTE — Progress Notes (Signed)
Spoke with Jodi Butler regarding follow up for Tecfidera for multiple sclerosis.     Administration instructions: Tecfidera   240mg  by mouth twice daily     Adherence assessment: Indyah took her first dose of 240mg  01/07/20 AM and felt fine when speaking with my colleague , PharmD, however then her rash returned and she has not taken anymore doses of Tecfidera.     Side effects reported: Rash/itching primarily on arms and less severe on her chest. This started to resolve last night but continues through to today. She also noticed some redness/warmth in her cheeks yesterday. She denies any SOB or facial swelling. This occurred previously after taking 120mg  doses but resolved quickly with diphenhydramine. Discussed possibility of using aspirin 81mg  pre-dose if determined this is flushing related to Tecfidera. However, uncertain if flushing vs. allergic reaction given initial resolution with diphenhydramine and length of time rash/itching has lasted since yesterday.  She will not take any more Tecfidera until further advice, will defer to her MS clinician for recommendation.     Specialty Pharmacy: [facility]    Patient reports no changes to medications.    Plan to follow up with patient later today after discussing with her MS clinician. Encouraged her to call with any questions or concerns.    Thank you for allowing me to participate in the care of this patient. Please contact me with any questions.      03/08/20, PharmD  [facility]  Phone: [phone]  Fax: [phone]

## 2020-01-08 NOTE — Telephone Encounter (Signed)
Pharmacy had sent message to NP pool regarding the potential reaction to dimethyl fumarate.  Pt sent MyChart attachment with pictures of rash.  Will also forward to NP pool.

## 2020-01-13 ENCOUNTER — Other Ambulatory Visit: Payer: Self-pay | Admitting: Family Medicine

## 2020-01-14 ENCOUNTER — Other Ambulatory Visit: Payer: Self-pay

## 2020-01-14 ENCOUNTER — Encounter: Payer: Self-pay | Admitting: Primary Care

## 2020-01-14 ENCOUNTER — Ambulatory Visit: Payer: Medicare (Managed Care) | Admitting: Primary Care

## 2020-01-14 VITALS — Ht 61.0 in | Wt 164.0 lb

## 2020-01-14 DIAGNOSIS — R030 Elevated blood-pressure reading, without diagnosis of hypertension: Secondary | ICD-10-CM

## 2020-01-14 DIAGNOSIS — F329 Major depressive disorder, single episode, unspecified: Secondary | ICD-10-CM

## 2020-01-14 DIAGNOSIS — G35 Multiple sclerosis: Secondary | ICD-10-CM

## 2020-01-14 DIAGNOSIS — F32A Depression, unspecified: Secondary | ICD-10-CM

## 2020-01-14 DIAGNOSIS — F419 Anxiety disorder, unspecified: Secondary | ICD-10-CM

## 2020-01-14 NOTE — Telephone Encounter (Signed)
Last office visit:   10/12/2019  Patients upcoming appointments:  Future Appointments   Date Time Provider [place]   01/14/2020  3:00 PM Johny Drilling, MD [facility] None   03/27/2020 12:00 PM Charlann Lange, MD [facility] None   10/13/2020  1:00 PM Johny Drilling, MD [facility] None     Recent Lab results:  GENERAL CHEMISTRY   Recent Labs     12/03/19  1743 06/13/19  1201   NA 140 139   K 3.9 4.2   CL 102 102   CO2 24 27   GAP 14 10   UN 10 10   CREAT 0.82 0.82   GFRC 85 85   GFRB 98 98   GLU 109* 84   CA 9.8 9.6      LIPID PROFILE   Recent Labs     06/13/19  1201   CHOL 179   TRIG 45   HDL 73*   LDLC 97      LIVER PROFILE   Recent Labs     12/03/19  1743 06/13/19  1201   ALT 20 28   AST 15 18   ALK 65 78   TB 0.5 0.4      DIABETES THYROID   Recent Labs     06/13/19  1201   HA1C 5.1    No value within the past 365 days      Pending/Orders Labs:  Lab Frequency Next Occurrence        Recent Lab Values 02/17/2018   EXP DATE 04/06/18

## 2020-01-14 NOTE — Progress Notes (Signed)
[facility]      TeleHome Video Encounter   Location of Patient: car  Location of Telemedicine Provider: hospital / clinical location  Other participants in telemedicine encounter and roles: no other participants.  Reason for visit: Follow-up (med rec. and mood)  Patient's problem list, allergies, and medications were reviewed and updated as appropriate. Please see the EHR for full details.  Consent was obtained from the patient to complete this video visit; including the potential for financial liability.                SUBJECTIVE    Pt is here to discuss:    Chief Complaint   Patient presents with    Follow-up     med rec. and mood         1. Fu depression and anxiety, progressive multiple sclerosis - was doing better, had minor anxiety when she started tecfidera, thinks she was anxious due to not knowing side effects. Ended up having itching as SE and had to take a break, but now tolerating it with ASA and benadryl. Enjoying the lack of injection that she was having with copaxone.     Think it's helping the mood too  No improvemens in her memory so far         PMH / Family Hx / Social Hx  Patient's medications, allergies, problem list, past medical, social histories were reviewed and notable for:    Current Outpatient Medications   Medication Sig Note    dimethyl fumarate (TECFIDERA STARTER PACK) 120 & 240 mg DR capsule Take 120 mg by mouth twice daily for 7 days, then take 240 mg by mouth twice daily thereafter     cetirizine (ZYRTEC) 10 MG tablet TAKE 1 TABLET BY MOUTH EVERY DAY     amantadine (SYMMETREL) 100 MG tablet Take 2 tablets (200 mg total) by mouth 2 times daily 12/24/2019: Reports taking 3 tablets every morning    DULoxetine (CYMBALTA) 30 MG DR capsule Take 1 capsule (30 mg total) by mouth daily Take with 60mg  dose to total 90mg  daily. Replacing lexapro 10mg , cancel other refills.     mirabegron (MYRBETRIQ) 50 MG 24 hr tablet Take 1 tablet (50 mg total) by mouth daily     buPROPion  (WELLBUTRIN XL) 300 MG 24 hr tablet Take 1 tablet (300 mg total) by mouth every morning Swallow whole. Do not crush, break, or chew.     DULoxetine (CYMBALTA) 60 MG DR capsule Take 1 capsule (60 mg total) by mouth daily     fluticasone (FLONASE) 50 MCG/ACT nasal spray SPRAY 1 SPRAY INTO EACH NOSTRIL ONE TIME DAILY     tiZANidine (ZANAFLEX) 4 MG tablet TAKE 1 TABLET BY MOUTH EVERY 8 HOURS AS NEEDED FOR MUSCLE SPASMS     diazePAM (VALIUM) 5 MG tablet Take 1 tab 1 hour prior to procedure, repeat later if still anxious. MDD 10mg      carbamide peroxide (DEBROX) 6.5 % otic solution Place 5 drops into both ears 2 times daily     triamcinolone (KENALOG) 0.1 % ointment Apply cream/ointment to areas on hands.     ibuprofen (ADVIL,MOTRIN) 200 MG tablet Take 400 mg by mouth 2 times daily as needed for Pain           OBJECTIVE  Vitals:    01/14/20 1448   Weight: 74.4 kg (164 lb)   Height: 1.549 m (5\' 1" )     Body mass index is 30.99 kg/m.  General: well-appearing Caucasian female, pleasant & conversant, in NAD     Psych: AAOx3, normal affect and mood. Insight and judgement intact.     Recent Review Flowsheet Data     PHQ Scores 01/14/2020 10/12/2019 10/12/2019 04/09/2019 10/10/2018 06/15/2018 01/16/2018    PSQ2 Q1 - Interest/Pleasure N Y Y N N N N    PSQ2 Q2 - Down, Depressed, Hopeless N Y - N N N N    PHQ Q9 - Better Off Dead - 1 - 0 - 0 -    PHQ Calculated Score - 15 - 6 - 6 -          GAD-7 Dates 01/14/2020 04/09/2019   Total Score 4 6           ASSESSMENT & PLAN  1. Anxiety and depression  2. Multiple sclerosis    Interval improvement in subjective mood/anxiety levels  Continue current cymbalta dose  Continue new M S medication per neurology      Follow-up: January for CPE      Farrell Ours, MD  [facility]  01/14/2020  2:54 PM        ______________________

## 2020-01-14 NOTE — Telephone Encounter (Signed)
Last office visit:   10/12/2019  Patients upcoming appointments:  Future Appointments   Date Time Provider [place]   01/14/2020  3:00 PM Johny Drilling, MD [facility] None   03/27/2020 12:00 PM Charlann Lange, MD [facility] None   10/13/2020  1:00 PM Johny Drilling, MD [facility] None       Recent Lab Values 02/17/2018   EXP DATE 04/06/18       Recent Lab results:  GENERAL CHEMISTRY   Recent Labs     12/03/19  1743 06/13/19  1201   NA 140 139   K 3.9 4.2   CL 102 102   CO2 24 27   GAP 14 10   UN 10 10   CREAT 0.82 0.82   GFRC 85 85   GFRB 98 98   GLU 109* 84   CA 9.8 9.6      LIPID PROFILE   Recent Labs     06/13/19  1201   CHOL 179   TRIG 45   HDL 73*   LDLC 97      LIVER PROFILE   Recent Labs     12/03/19  1743 06/13/19  1201   ALT 20 28   AST 15 18   ALK 65 78   TB 0.5 0.4      DIABETES THYROID   Recent Labs     06/13/19  1201   HA1C 5.1    No value within the past 365 days      Pending/Orders Labs:  Lab Frequency Next Occurrence

## 2020-01-16 MED ORDER — FLUTICASONE PROPIONATE 50 MCG/ACT NA SUSP *I*
NASAL | 1 refills | Status: DC
Start: 2020-01-16 — End: 2020-08-13

## 2020-01-17 ENCOUNTER — Encounter: Payer: Self-pay | Admitting: Gastroenterology

## 2020-01-21 ENCOUNTER — Other Ambulatory Visit: Payer: Self-pay

## 2020-01-21 DIAGNOSIS — G35 Multiple sclerosis: Secondary | ICD-10-CM

## 2020-01-23 ENCOUNTER — Encounter: Payer: Self-pay | Admitting: Neurology

## 2020-01-23 ENCOUNTER — Other Ambulatory Visit: Payer: Self-pay

## 2020-01-24 NOTE — Telephone Encounter (Signed)
Will defer pt's message to NP pool for recommendations.

## 2020-01-31 ENCOUNTER — Encounter: Payer: Self-pay | Admitting: Neurology

## 2020-02-04 ENCOUNTER — Other Ambulatory Visit: Payer: Self-pay

## 2020-02-04 ENCOUNTER — Other Ambulatory Visit: Payer: Self-pay | Admitting: Neurology

## 2020-02-04 DIAGNOSIS — G35 Multiple sclerosis: Secondary | ICD-10-CM

## 2020-02-04 MED ORDER — TECFIDERA 240 MG PO CPDR
240.0000 mg | DELAYED_RELEASE_CAPSULE | Freq: Two times a day (BID) | ORAL | 5 refills | Status: DC
Start: 2020-02-04 — End: 2020-04-25
  Filled 2020-02-04: qty 60, 30d supply, fill #0
  Filled 2020-03-07: qty 60, 30d supply, fill #1
  Filled 2020-04-09: qty 60, 30d supply, fill #2

## 2020-02-04 NOTE — Telephone Encounter (Signed)
Patient called [facility] requesting refill of Tecfidera 240mg  BID, tolerating well since taking with peanutbutter.     Please send prescription for refill due this week. Thanks!

## 2020-02-06 ENCOUNTER — Other Ambulatory Visit: Payer: Self-pay

## 2020-02-07 ENCOUNTER — Other Ambulatory Visit: Payer: Self-pay

## 2020-02-12 ENCOUNTER — Other Ambulatory Visit: Payer: Self-pay

## 2020-02-13 ENCOUNTER — Other Ambulatory Visit: Payer: Self-pay

## 2020-02-14 ENCOUNTER — Encounter: Payer: Self-pay | Admitting: Neurology

## 2020-02-14 ENCOUNTER — Encounter: Payer: Self-pay | Admitting: Primary Care

## 2020-02-20 ENCOUNTER — Ambulatory Visit: Payer: Medicare (Managed Care)

## 2020-02-21 ENCOUNTER — Encounter: Payer: Self-pay | Admitting: Primary Care

## 2020-02-21 NOTE — Telephone Encounter (Signed)
Nursing unable to order not on current or hx med list      Last office visit:   10/12/2019  Patients upcoming appointments:  Future Appointments   Date Time Provider [place]   03/27/2020 12:00 PM Charlann Lange, MD [facility] None   10/13/2020  1:00 PM Johny Drilling, MD [facility] None     Recent Lab results:  GENERAL CHEMISTRY   Recent Labs     12/03/19  1743 06/13/19  1201   NA 140 139   K 3.9 4.2   CL 102 102   CO2 24 27   GAP 14 10   UN 10 10   CREAT 0.82 0.82   GFRC 85 85   GFRB 98 98   GLU 109* 84   CA 9.8 9.6      LIPID PROFILE   Recent Labs     06/13/19  1201   CHOL 179   TRIG 45   HDL 73*   LDLC 97      LIVER PROFILE   Recent Labs     12/03/19  1743 06/13/19  1201   ALT 20 28   AST 15 18   ALK 65 78   TB 0.5 0.4      DIABETES THYROID   Recent Labs     06/13/19  1201   HA1C 5.1    No value within the past 365 days      Pending/Orders Labs:  Lab Frequency Next Occurrence        Recent Lab Values 02/17/2018   EXP DATE 04/06/18

## 2020-02-22 ENCOUNTER — Other Ambulatory Visit: Payer: Self-pay

## 2020-02-22 MED ORDER — ASPIRIN 81 MG PO TBEC *I*
81.0000 mg | DELAYED_RELEASE_TABLET | Freq: Two times a day (BID) | ORAL | 3 refills | Status: DC
Start: 2020-02-22 — End: 2020-02-22
  Filled 2020-02-22: qty 180, 90d supply, fill #0

## 2020-02-22 MED ORDER — ASPIRIN 81 MG PO TBEC *I*
81.0000 mg | DELAYED_RELEASE_TABLET | Freq: Two times a day (BID) | ORAL | 3 refills | Status: DC
Start: 2020-02-22 — End: 2020-08-20

## 2020-02-22 NOTE — Addendum Note (Signed)
Addended by: Dominic Pea on: 02/22/2020 12:51 PM     Modules accepted: Orders

## 2020-03-07 ENCOUNTER — Other Ambulatory Visit: Payer: Self-pay

## 2020-03-11 ENCOUNTER — Other Ambulatory Visit: Payer: Self-pay

## 2020-03-12 ENCOUNTER — Other Ambulatory Visit: Payer: Self-pay

## 2020-03-24 ENCOUNTER — Other Ambulatory Visit: Payer: Self-pay | Admitting: Primary Care

## 2020-03-24 DIAGNOSIS — F32A Depression, unspecified: Secondary | ICD-10-CM

## 2020-03-24 NOTE — Telephone Encounter (Signed)
Last office visit:   10/12/2019  Patients upcoming appointments:  Future Appointments   Date Time Provider [place]   03/27/2020 12:00 PM Charlann Lange, MD [facility] None   10/13/2020  1:00 PM Johny Drilling, MD [facility] None       Recent Lab Values 02/17/2018   EXP DATE 04/06/18       Recent Lab results:  GENERAL CHEMISTRY   Recent Labs     12/03/19  1743 06/13/19  1201   NA 140 139   K 3.9 4.2   CL 102 102   CO2 24 27   GAP 14 10   UN 10 10   CREAT 0.82 0.82   GFRC 85 85   GFRB 98 98   GLU 109* 84   CA 9.8 9.6      LIPID PROFILE   Recent Labs     06/13/19  1201   CHOL 179   TRIG 45   HDL 73*   LDLC 97      LIVER PROFILE   Recent Labs     12/03/19  1743 06/13/19  1201   ALT 20 28   AST 15 18   ALK 65 78   TB 0.5 0.4      DIABETES THYROID   Recent Labs     06/13/19  1201   HA1C 5.1    No value within the past 365 days      Pending/Orders Labs:  Lab Frequency Next Occurrence

## 2020-03-27 ENCOUNTER — Ambulatory Visit: Payer: Medicare (Managed Care) | Attending: Neurology | Admitting: Neurology

## 2020-03-27 ENCOUNTER — Encounter: Payer: Self-pay | Admitting: Neurology

## 2020-03-27 DIAGNOSIS — G35 Multiple sclerosis: Secondary | ICD-10-CM | POA: Insufficient documentation

## 2020-03-27 NOTE — Progress Notes (Addendum)
Patient encounter was performed via telemedicine.      [x]  Real time audiovisual    []   Store and Forward      Patient located at home    Provider located at:   [x]  Home office  []  Clinical office   []   Administrative office         Other participants in telemedicine encounter and roles:  Her wife,    [facility]   Follow-Up Visit     I saw Jodi Butler in follow up for her multiple sclerosis, along with her wife .     Disease summary:  She is a 49 y.o.who had an attack of diplopia and left facial weakness in 1998 with a lesion in the pons, and was diagnosed with MS in 2003 after an attack of bilateral leg weakness and sensory symptoms, with new brain lesions typical of MS and CSF with oligoclonal bands.  She started Copaxone in 2004 and has continued it since, eventually transitioning to 40 mg three times per week dosing and generic (Glatopa). She had no further attacks but has struggled with faitigue, spasms and cognitive symptoms (due to which she is on disability), and switched to Tecfidera in July 2021 after MRI showed a new cervical spine lesion that had developed while on glatiramer acetate.    Interval history:  She was last seen in via telemedicine in July 2021. At that time, she was switched to Tecfidera since she developed spinal cord activity while on glatiramer acetate. Initially on Tecfidera, she had significant nausea and GI upset even when she was taking it with meals. She started taking peanut butter and aspirin 30 min before each dose. Symptoms have resolved. She was able to take fully take tecfidera twice a day in September 2021. Her back pain has now improved.     She still gets spasms in her legs but since starting tizanidine, it is helpful and associated headaches were less frequent. She did not tolerate baclofen in the past. She has been to [facility] to learn about CBD/THC but still in the process of getting the medications. Coordination and walking have been the  same as before. Her memory is similar as before. No recent falls.     Since being on Tecfidera, she does not have any new symptoms. No new weakness, numbness, no issues with urination since taking myrbetriq, or vision changes.     She is walking around the house regularly but not exercising regularly due to spasms in her legs.     She had her COVID vaccine in March.      Exam: She looks well-rested, does not appear to be in acute distress, cheerful affect.  She is alert and attentive, with normal language visual fields are full to confrontation, pupils equal, versions are full without nystagmus, facial sensation and expression symmetric, shrug is even, tongue is midline.  She has no tremor or pronator drift, symmetric antigravity arm movements, finger-taps are symmetric and rapid bilaterally.  She is able to balance on each foot.     Tests: She has no new imaging since her last visit. She has not had new monitoring labs since start tecfidera.     Impression:  Multiple sclerosis with longstanding cognitive symptoms, spasms, bladder symptoms and neuropathic pain newly switched to Tecifdera due to breakthrough spinal disease on glatiramer acetate. She now has been tolerating Tecfidera since September 2021 without new symptoms. Her back also has resolved. We will repeat monitoring  labs and repeat MRI brain after 6 months on Tecfidera.     Plan:  - Continue Tecfidera  - Repeat MRI brain in March 2022   - CBC with diff and LFTs q36months   - Follow-up in April 2022 once MRI is completed     Jodi Bodo, MD     Neuroimmunology attending  I saw and evaluated the patient. I agree with Dr. Janace Litten findings and plan of care as documented above, which reflect my input.    Jodi Saucer, MD

## 2020-03-27 NOTE — Progress Notes (Deleted)
Patient encounter was performed via telemedicine.      []  Real time audiovisual    []   Store and Forward      Patient located at located at:   []  Home office  []  Clinical office   []   Administrative office         Other participants in telemedicine encounter and roles:  Her wife,    [facility]   Follow-Up Visit     I saw Mida Cory in follow up for her multiple sclerosis, along with her wife . She is a 49 y.o.who had an attack of diplopia and left facial weakness in 1998 with a lesion in the pons, and was diagnosed with MS in 2003 after an attack of bilateral leg weakness and sensory symptoms, with new brain lesions typical of MS and CSF with oligoclonal bands.  She started Copaxone in 2004 and has continued it since, eventually transitioning to 40 mg three times per week dosing and generic (Glatopa). She has not had further attacks but has struggled with faitigue and cognitive symptoms (due to which she is on disability), and spasms.    She was last seen in via telemedicine in July 2021. At that time, she was switched to Tecfidera since she developed spinal cord activity while on glatiramer acetate. Since starting Tecfidera, she had significant nausea and GI upset even when she was taking it with meals. She started taking peanut butter and aspirin 30 min before each dose. Symptoms have resolved. She was able to take fully take tecfidera twice a day in September 2021.     She still gets spasms in her legs but since starting tizanidine, it is helpful but associated headaches were less frequent. She did not tolerate baclofen in the past. She has been to [facility] to learn about CBD/THC but still in the process of getting the medications. Coordination and walking have been the same as before. Her memory is similar as before. No recent falls.     Since being on Tecfidera, she does not have any new symptoms. No new weakness, numbness, no issues with urination since taking  myrbetriq, or vision changes.     She is walking around the house regularly but not exercising regularly due to spasms in her legs.     She had her COVID vaccine in March.      Exam: She looks tired, and uncomfortable with movement.  She is alert and attentive, with normal language visual fields are full to confrontation, pupils equal, versions are choppy but without nystagmus, facial sensation and expression symmetric, shrug is even, tongue is midline.  She has no tremor or pronator drift, full strength throughout.  Sensation is well preserved to vibration at the toes, and intact to cold.  She has no ataxia with finger-to-nose or heel-to-shin.  Gait is antalgic due to her back pain, and she did not do a timed 25 foot walk today.    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  Her cervical spine MRI showed small scattered T2 lesions in the cervical cord and (C3-7) that were new since her 2009 scan, and a thin linear T2 signal from C6-T2 (read as within the left hemicord, but  to my eye looks more central and not clear if this is a lesion or slightly widened central canal), with perhaps some subtle enhancement.        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  She would likely benefit from higher-potency immunotherapy to better control her MS, to prevent development of new lesions, further relapses, and future disability.  As she has only been treated with low-potency glatiramer acetate, reasonable options for stronger treatment could include pills like  dimethyl fumarate or fingolimod, or potentially higher-potency infusions such as natalizumab or ocrelizumab.  She will get screening bloodwork today to help inform the potential risks associated with these treatments, and we'll follow up in a 2 weeks to discuss further and choose.     In addition, given the development of a multi-segment linear T2 abnormality in the spinal cord (read as a likely demyelinating lesion though I'm not completely sure), it is reasonable to check serology for neuromyelitis optica and MOG antibody disease which would affect choice of treatment.    Her back pain since the MRI seems musculoskeletal, and will likely continue to improve.      Plan:  - bloodwork today for AQP4 and MOG serologies, and baseline/screening for higher potency immunotherapies (including CBC, metabolic panel and LFTs, lymphocyte subsets, JCV and VZV serology, and screening for TB, Hep B and C).  - Medical marijuana certificiation for pain and spasticity  - Continue glatiramer acetate for now  - Follow up in 2 weeks via telemedicine to discuss treatment choice    Mittie Bodo, MD  _____________________  I personally spent 40 minutes on the calendar day of the encounter, including pre and post visit work.

## 2020-03-27 NOTE — Patient Instructions (Addendum)
1) Please get blood work to see how Tecfidera is affecting your white blood cells. Get the bloodwork now and your next time will be in January 2022. You will need to get this labwork every 3 months.   2) Along with that, we will also check your liver levels.   3) Get your MRI brain in March 2022.     Please reach out to the clinic with any additional questions or concerns. We encourage you to sign up and use MyChart for any non-urgent medical questions as MyChart is the preferred method of communication.  Please contact your pharmacy for medication refills and allow up to 2 business days for routine prescription refills.  For urgent messages, please call the clinic at [phone].  The clinic is open from 8am to 4:30pm Monday through Friday.

## 2020-03-31 ENCOUNTER — Telehealth: Payer: Self-pay | Admitting: Primary Care

## 2020-03-31 ENCOUNTER — Telehealth: Payer: Self-pay | Admitting: Neurology

## 2020-03-31 NOTE — Telephone Encounter (Signed)
mychart sent recommending covid booster given h/o Mult Sclerosis

## 2020-03-31 NOTE — Telephone Encounter (Signed)
Per MD Belizzi's checkout notes on 03/27/20    Writer attempted to contact patient in regards to scheduling her next appointment with MD in 6 months    Writer left VM and sent patient a mychart message

## 2020-04-03 ENCOUNTER — Other Ambulatory Visit: Payer: Self-pay

## 2020-04-03 MED ORDER — TIZANIDINE HCL 4 MG PO TABS *I*
ORAL_TABLET | ORAL | 0 refills | Status: DC
Start: 2020-04-03 — End: 2020-06-30

## 2020-04-03 NOTE — Telephone Encounter (Signed)
Last office visit:   10/12/2019  Patients upcoming appointments:  Future Appointments   Date Time Provider [place]   09/25/2020 12:00 PM Charlann Lange, MD [facility] None   10/13/2020  1:00 PM Johny Drilling, MD [facility] None       Recent Lab Values 02/17/2018   EXP DATE 04/06/18       Recent Lab results:  GENERAL CHEMISTRY   Recent Labs     12/03/19  1743 06/13/19  1201   NA 140 139   K 3.9 4.2   CL 102 102   CO2 24 27   GAP 14 10   UN 10 10   CREAT 0.82 0.82   GFRC 85 85   GFRB 98 98   GLU 109* 84   CA 9.8 9.6      LIPID PROFILE   Recent Labs     06/13/19  1201   CHOL 179   TRIG 45   HDL 73*   LDLC 97      LIVER PROFILE   Recent Labs     12/03/19  1743 06/13/19  1201   ALT 20 28   AST 15 18   ALK 65 78   TB 0.5 0.4      DIABETES THYROID   Recent Labs     06/13/19  1201   HA1C 5.1    No value within the past 365 days      Pending/Orders Labs:  Lab Frequency Next Occurrence   Hepatic function panel Once 03/27/2020   MR head without and with contrast Once 08/25/2020   CBC and differential SEE COMMENTS 04/07/2020, 04/14/2020, 04/21/2020

## 2020-04-09 ENCOUNTER — Other Ambulatory Visit: Payer: Self-pay

## 2020-04-10 ENCOUNTER — Other Ambulatory Visit: Payer: Self-pay

## 2020-04-10 ENCOUNTER — Ambulatory Visit: Payer: Medicare (Managed Care) | Attending: Primary Care | Admitting: Primary Care

## 2020-04-10 ENCOUNTER — Encounter: Payer: Self-pay | Admitting: Primary Care

## 2020-04-10 ENCOUNTER — Encounter: Payer: Self-pay | Admitting: Neurology

## 2020-04-10 VITALS — HR 75 | Temp 97.0°F

## 2020-04-10 DIAGNOSIS — Z20822 Contact with and (suspected) exposure to covid-19: Secondary | ICD-10-CM | POA: Insufficient documentation

## 2020-04-10 DIAGNOSIS — Z20828 Contact with and (suspected) exposure to other viral communicable diseases: Secondary | ICD-10-CM | POA: Insufficient documentation

## 2020-04-10 DIAGNOSIS — G35 Multiple sclerosis: Secondary | ICD-10-CM

## 2020-04-10 DIAGNOSIS — R519 Headache, unspecified: Secondary | ICD-10-CM | POA: Insufficient documentation

## 2020-04-10 DIAGNOSIS — M791 Myalgia, unspecified site: Secondary | ICD-10-CM

## 2020-04-10 NOTE — Telephone Encounter (Signed)
Call to [place] and spoke to wife judy. [name] states she is on her way with patient to see her PCP to evaluate her symptoms.

## 2020-04-10 NOTE — Progress Notes (Signed)
[facility]          SUBJECTIVE    Pt is here to discuss:    Chief Complaint   Patient presents with    Headache    Generalized Body Aches         1. H/a - started Monday, accompanied with nausea.  Yesterday was intense, treated with ibuprofen 800mg  and phenergan with relief. No previous h/o migraines.    Today feeling generalized achiness, fatigue, nauseate, diarrhea  +Cough, nasal congestion  +subjective chills     Denies fevers  Denies loss of taste/smell        PMH / Family Hx / Social Hx  Patient's medications, allergies, problem list, past medical, social histories were reviewed and notable for    History of multiple sclerosis    Current Outpatient Medications   Medication Sig Note    tiZANidine (ZANAFLEX) 4 mg tablet TAKE 1 TABLET BY MOUTH EVERY 8 HOURS AS NEEDED FOR MUSCLE SPASMS     DULoxetine (CYMBALTA) 30 mg DR capsule TAKE 1 CAPSULE BY MOUTH EVERY DAY TAKE WITH 60MG  DOSE TO TOTAL 90MG  DAILY. THIS IS REPLACING ESCITALOPRAM 10MG      aspirin 81 MG EC tablet Take 1 tablet (81 mg total) by mouth 2 times daily  prior to Tecfidera     TECFIDERA 240 MG DR capsule Take 1 capsule (240 mg total) by mouth 2 times daily     fluticasone (FLONASE) 50 MCG/ACT nasal spray SPRAY 1 SPRAY INTO EACH NOSTRIL ONE TIME DAILY     cetirizine (ZYRTEC) 10 MG tablet TAKE 1 TABLET BY MOUTH EVERY DAY     amantadine (SYMMETREL) 100 MG tablet Take 2 tablets (200 mg total) by mouth 2 times daily (Patient taking differently: Take 300 mg by mouth daily  ) 12/24/2019: Reports taking 3 tablets every morning    diazePAM (VALIUM) 5 MG tablet Take 1 tab 1 hour prior to procedure, repeat later if still anxious. MDD 10mg      mirabegron (MYRBETRIQ) 50 MG 24 hr tablet Take 1 tablet (50 mg total) by mouth daily     buPROPion (WELLBUTRIN XL) 300 MG 24 hr tablet Take 1 tablet (300 mg total) by mouth every morning Swallow whole. Do not crush, break, or chew.     carbamide peroxide (DEBROX) 6.5 % otic solution Place  5 drops into both ears 2 times daily     DULoxetine (CYMBALTA) 60 MG DR capsule Take 1 capsule (60 mg total) by mouth daily     triamcinolone (KENALOG) 0.1 % ointment Apply cream/ointment to areas on hands.     ibuprofen (ADVIL,MOTRIN) 200 MG tablet Take 400 mg by mouth 2 times daily as needed for Pain      Immunization History   Administered Date(s) Administered    Covid-19 mRNA vaccine (PFIZER) IM 30 mcg/0.99mL 08/10/2019, 08/31/2019    Influenza Quadrivalent 0.81mL prefilled syringe/single dose vial(FluLaval,Fluzone,Afluria,Fluarix) 06/12/2015, 06/14/2016, 04/06/2018, 04/09/2019    Influenza multi-dose vial 04/23/2013, 05/21/2014, 04/20/2017    Pneumococcal Polysaccharide (Pneumovax) 10/12/2019    Tdap 04/09/2019     No COVID exposure    OBJECTIVE  Vitals:    04/10/20 1557   Pulse: 75   Temp: 36.1 C (97 F)   SpO2: 98%     There is no height or weight on file to calculate BMI.      General: well-appearing Caucasian female, pleasant & conversant, in NAD  Eyes:. PERRLA. EOMI. Conjunctiva pink, without swelling or exudate. Lids normal.  ENT: Moist mucous membranes. Normal lips and dentition. B/L TMs obscured by cerumen   Neck: Trachea midline. Thyroid nontender, without nodularity or enlargement.   Lymph: No cervical or supraclavicular lymphadenopathy.  Lungs: Normal resp effort. CTAB.  No crackles or wheezes.  Cardiac: RRR, no M/R/G.   Psych: AAOx3, normal affect and mood. Insight and judgement intact.           ASSESSMENT & PLAN  1. Headache  2. Myalgia   3. Multiple sclerosis        - COVID-19 PCR; Future  - Influenza A PCR; Future  - Influenza B PCR; Future  - RSV PCR; Future  - RSV PCR  - Influenza B PCR  - Influenza A PCR  - COVID-19 PCR         Recommended testing as above to rule out breakthrough COVID, RSV, flu etiologies. Reminded to quarantine until results available.     Recommended conservative care with rest, fluids,  honey (in tea or throat lozenges).     Reviewed availability and  recommendation for monoclonal aB if +COVID, given her immunosuppressing disease and medications and potential lack of full benefit from vaccination. Pt expresses interest in this, and reviewed availabilty of regen COV in our office on Monday    Follow-up: pending result      Farrell Ours, MD  [facility]  04/10/2020  4:26 PM        ______________________

## 2020-04-11 LAB — COVID-19 PCR

## 2020-04-11 LAB — COVID-19 NAAT (PCR): COVID-19 NAAT (PCR): NEGATIVE

## 2020-04-11 LAB — INFLUENZA A: Influenza A PCR: 0

## 2020-04-11 LAB — RSV PCR: RSV PCR: 0

## 2020-04-11 LAB — INFLUENZA B PCR: Influenza B PCR: 0

## 2020-04-14 ENCOUNTER — Other Ambulatory Visit
Admission: RE | Admit: 2020-04-14 | Discharge: 2020-04-14 | Disposition: A | Discharge status: Home / Self Care | Payer: Medicare (Managed Care) | Source: Ambulatory Visit | Attending: Neurology | Admitting: Neurology

## 2020-04-14 DIAGNOSIS — G35 Multiple sclerosis: Secondary | ICD-10-CM | POA: Insufficient documentation

## 2020-04-14 LAB — CBC AND DIFFERENTIAL
Baso # K/uL: 0.1 10*3/uL (ref 0.0–0.1)
Basophil %: 1.6 %
Eos # K/uL: 0.1 10*3/uL (ref 0.0–0.4)
Eosinophil %: 1.4 %
Hematocrit: 40 % (ref 34–45)
Hemoglobin: 12.9 g/dL (ref 11.2–15.7)
IMM Granulocytes #: 0 10*3/uL (ref 0.0–0.0)
IMM Granulocytes: 0 %
Lymph # K/uL: 1.7 10*3/uL (ref 1.2–3.7)
Lymphocyte %: 38.7 %
MCH: 32 pg (ref 26–32)
MCHC: 32 g/dL (ref 32–36)
MCV: 99 fL — ABNORMAL HIGH (ref 79–95)
Mono # K/uL: 0.3 10*3/uL (ref 0.2–0.9)
Monocyte %: 6.4 %
Neut # K/uL: 2.3 10*3/uL (ref 1.6–6.1)
Nucl RBC # K/uL: 0 10*3/uL (ref 0.0–0.0)
Nucl RBC %: 0 /100 WBC (ref 0.0–0.2)
Platelets: 203 10*3/uL (ref 160–370)
RBC: 4.1 MIL/uL (ref 3.9–5.2)
RDW: 12.6 % (ref 11.7–14.4)
Seg Neut %: 51.9 %
WBC: 4.4 10*3/uL (ref 4.0–10.0)

## 2020-04-14 LAB — HEPATIC FUNCTION PANEL
ALT: 30 U/L (ref 0–35)
Albumin: 4.5 g/dL (ref 3.5–5.2)
Alk Phos: 82 U/L (ref 35–105)
Bilirubin,Direct: <0.2 mg/dL (ref 0.0–0.3)
Bilirubin,Total: 0.3 mg/dL (ref 0.0–1.2)
Total Protein: 6.6 g/dL (ref 6.3–7.7)

## 2020-04-15 ENCOUNTER — Encounter: Payer: Self-pay | Admitting: Neurology

## 2020-04-15 LAB — RESOLUTION

## 2020-04-16 NOTE — Telephone Encounter (Signed)
Pt currently on Tecfidera and has been eperiencing an increase in headaches and GI issues.  She is asking if changing to Vumerity would be a good option.  Will defer to NP pool for further advisement.

## 2020-04-21 ENCOUNTER — Other Ambulatory Visit: Payer: Self-pay | Admitting: Family Medicine

## 2020-04-21 ENCOUNTER — Other Ambulatory Visit: Payer: Self-pay | Admitting: Neurology

## 2020-04-21 DIAGNOSIS — F32A Depression, unspecified: Secondary | ICD-10-CM

## 2020-04-22 ENCOUNTER — Other Ambulatory Visit: Payer: Self-pay

## 2020-04-22 NOTE — Telephone Encounter (Signed)
Last office visit:   04/10/2020  Patients upcoming appointments:  Future Appointments   Date Time Provider [place]   09/25/2020 12:00 PM Charlann Lange, MD IMM None   10/13/2020  1:00 PM Johny Drilling, MD CAF None       Recent Lab Values 02/17/2018   EXP DATE 04/06/18       Recent Lab results:  GENERAL CHEMISTRY   Recent Labs     12/03/19  1743 06/13/19  1201   NA 140 139   K 3.9 4.2   CL 102 102   CO2 24 27   GAP 14 10   UN 10 10   CREAT 0.82 0.82   GFRC 85 85   GFRB 98 98   GLU 109* 84   CA 9.8 9.6      LIPID PROFILE   Recent Labs     06/13/19  1201   CHOL 179   TRIG 45   HDL 73*   LDLC 97      LIVER PROFILE   Recent Labs     04/14/20  1506 12/03/19  1743 06/13/19  1201   ALT '30 20 28   ' AST CANCELED 15 18   ALK 82 65 78   TB 0.3 0.5 0.4      DIABETES THYROID   Recent Labs     06/13/19  1201   HA1C 5.1    No value within the past 365 days      Pending/Orders Labs:  Lab Frequency Next Occurrence   MR head without and with contrast Once 08/25/2020   CBC and differential SEE COMMENTS

## 2020-04-23 ENCOUNTER — Other Ambulatory Visit: Payer: Self-pay

## 2020-04-23 ENCOUNTER — Encounter: Payer: Self-pay | Admitting: Gastroenterology

## 2020-04-23 DIAGNOSIS — Z9109 Other allergy status, other than to drugs and biological substances: Secondary | ICD-10-CM

## 2020-04-23 NOTE — Telephone Encounter (Signed)
Last office visit:   04/10/2020  Patients upcoming appointments:  Future Appointments   Date Time Provider [place]   09/25/2020 12:00 PM Charlann Lange, MD IMM None   10/13/2020  1:00 PM Johny Drilling, MD CA[facility] None       Recent Lab Values 02/17/2018   EXP DATE 04/06/18       Recent Lab results:  GENERAL CHEMISTRY   Recent Labs     12/03/19  1743 06/13/19  1201   NA 140 139   K 3.9 4.2   CL 102 102   CO2 24 27   GAP 14 10   UN 10 10   CREAT 0.82 0.82   GFRC 85 85   GFRB 98 98   GLU 109* 84   CA 9.8 9.6      LIPID PROFILE   Recent Labs     06/13/19  1201   CHOL 179   TRIG 45   HDL 73*   LDLC 97      LIVER PROFILE   Recent Labs     04/14/20  1506 12/03/19  1743 06/13/19  1201   ALT '30 20 28   ' AST CANCELED 15 18   ALK 82 65 78   TB 0.3 0.5 0.4      DIABETES THYROID   Recent Labs     06/13/19  1201   HA1C 5.1    No value within the past 365 days      Pending/Orders Labs:  Lab Frequency Next Occurrence   MR head without and with contrast Once 08/25/2020   CBC and differential SEE COMMENTS

## 2020-04-24 MED ORDER — CETIRIZINE HCL 10 MG PO TABS *I*
10.0000 mg | ORAL_TABLET | Freq: Every day | ORAL | 1 refills | Status: DC
Start: 2020-04-24 — End: 2020-10-17

## 2020-04-25 ENCOUNTER — Other Ambulatory Visit: Payer: Self-pay

## 2020-04-25 ENCOUNTER — Telehealth: Payer: Self-pay | Admitting: Neurology

## 2020-04-25 DIAGNOSIS — G35 Multiple sclerosis: Secondary | ICD-10-CM

## 2020-04-25 MED ORDER — VUMERITY 231 MG PO CPDR
231.0000 mg | DELAYED_RELEASE_CAPSULE | Freq: Two times a day (BID) | ORAL | 0 refills | Status: DC
Start: 2020-04-25 — End: 2020-06-02
  Filled 2020-04-25: qty 120, 33d supply, fill #0

## 2020-04-25 NOTE — Telephone Encounter (Signed)
[facility], start form was sent to pt

## 2020-04-25 NOTE — Progress Notes (Signed)
We received Jodi Butler's prescription for Vumerity 231 mg. Confirmed with patient's insurance that she can fill at [facility]. Please specify [facility] as preferred pharmacy on service request form.     Additionally, this medication requires prior authorization from [employer]  (Member ID: [id number] ). PA submitted via CoverMyMeds.    Thank you,  Armando Reichert, PharmD  [facility]  [phone]

## 2020-04-25 NOTE — Telephone Encounter (Signed)
Lauren calling in looking for more information about the patients diroximel fumarate (VUMERITY) 231 MG DR capsule for a PA. States she will be faxing it over as well.      [phone]

## 2020-04-28 ENCOUNTER — Other Ambulatory Visit: Payer: Self-pay

## 2020-04-28 NOTE — Progress Notes (Signed)
Prior authorization for Vumerity 231 mg approved (Approval Dates: 03/26/20 - 04/28/21).

## 2020-04-28 NOTE — Telephone Encounter (Signed)
Lauren from [employer] calling in asking to speak with someone about this patient's Vumerity. She states that they can not cover this medication if they do not have reason as to why she can't try any other medications like this one. She states that she needs a call back before 10:30am or she will have to deny the medication.     Please advise and call back at [phone]

## 2020-04-28 NOTE — Telephone Encounter (Signed)
Called and spoke with Jodi Butler at [facility].   Discussed pt was on GA and had new spinal cord activity.  Pt was changed to higher-potency treatment, Tecfidera, in July.  Pt has had significant GI side effects from Tecfidera, which is the reason for the change to Vumerity.  Lauren is asking if pt would be candidate for Mayzent and further discussed that Mayzent is more immunosuppressive, which we would want to avoid given the current pandemic.  Lauren will enter in all the information and will call back at later time once approved.

## 2020-04-30 ENCOUNTER — Other Ambulatory Visit: Payer: Self-pay

## 2020-05-06 ENCOUNTER — Other Ambulatory Visit: Payer: Self-pay

## 2020-05-06 NOTE — Progress Notes (Signed)
Spoke with Liam Graham regarding prescription for Vumerity for multiple sclerosis on the phone.    Initial assessment included review of interdisciplinary team documentation on physical health, social, environmental, economic, functional and mental components relevant to the patient's ability to adhere to the care plan with [place].    Administration, treatment goals, duration of therapy, adherence strategies, side effects, safety precautions, contraindications, missed dose management, handling, storage and disposal reviewed with the patient.    Administration instructions: Vumerity 210m by mouth twice daily for 7 days followed by 4648mby mouth twice daily    Planned Start Date: 05/09/20    Adherence plan: Combine with daily tasks    Baseline testing reviewed:       Lab results: 04/14/20  1506   Total Protein 6.6   Albumin 4.5   ALT 30   AST CANCELED   Alk Phos 82   Bilirubin,Total 0.3   Bilirubin,Direct <0.2         Lab results: 04/14/20  1506   WBC 4.4   Hemoglobin 12.9   Hematocrit 40   RBC 4.1   Platelets 203   Neut # K/uL 2.3   Lymph # K/uL 1.7   Mono # K/uL 0.3   Eos # K/uL 0.1   Baso # K/uL 0.1   Seg Neut % 51.9   Lymphocyte % 38.7   Monocyte % 6.4   Eosinophil % 1.4   Basophil % 1.6       Lab schedule: LFTs and CBC with differential every 3 months    [facility]: [facility] Cop[facility] $0    Medication list reviewed; list is up to date. No significant interactions identified. I advised her to check with a pharmacist before starting any herbal supplements, medications or over the counter products.    Current Outpatient Medications   Medication Sig    diroximel fumarate (VUMERITY) 231 MG DR capsule Take 1 capsule (231 mg total) by mouth 2 times daily  Take 1 capsule (231 mg) by mouth twice daily for 7 days, then increase to 2 capsules (462 mg) twice daily    cetirizine (ZYRTEC) 10 mg tablet Take 1 tablet (10 mg total) by mouth daily     buPROPion (WELLBUTRIN XL) 300 mg 24 hr tablet TAKE 1 TABLET BY MOUTH EVERY MORNING SWALLOW WHOLE, DO NOT BREAK, CRUSH OR CHEW    naproxen (NAPROSYN) 250 mg tablet TAKE 1 TABLET BY MOUTH TWO TIMES DAILY WITH MEALS FOR 14 DAYS    tiZANidine (ZANAFLEX) 4 mg tablet TAKE 1 TABLET BY MOUTH EVERY 8 HOURS AS NEEDED FOR MUSCLE SPASMS    DULoxetine (CYMBALTA) 30 mg DR capsule TAKE 1 CAPSULE BY MOUTH EVERY DAY TAKE WITH 60MG DOSE TO TOTAL 90MG DAILY. THIS IS REPLACING ESCITALOPRAM 10MG    aspirin 81 MG EC tablet Take 1 tablet (81 mg total) by mouth 2 times daily  3073mprior to Tecfidera    fluticasone (FLONASE) 50 MCG/ACT nasal spray SPRAY 1 SPRAY INTO EACH NOSTRIL ONE TIME DAILY    amantadine (SYMMETREL) 100 MG tablet Take 2 tablets (200 mg total) by mouth 2 times daily (Patient taking differently: Take 300 mg by mouth daily  )    diazePAM (VALIUM) 5 MG tablet Take 1 tab 1 hour prior to procedure, repeat 9m33mater if still anxious. MDD 10mg108mmirabegron (MYRBETRIQ) 50 MG 24 hr tablet Take 1 tablet (50 mg total) by mouth daily  carbamide peroxide (DEBROX) 6.5 % otic solution Place 5 drops into both ears 2 times daily    DULoxetine (CYMBALTA) 60 MG DR capsule Take 1 capsule (60 mg total) by mouth daily    triamcinolone (KENALOG) 0.1 % ointment Apply cream/ointment to areas on hands.       Plan to follow up with patient one week after start. Encouraged her to call with any questions or concerns.    Thank you for allowing me to participate in the care of this patient. Please contact me with any questions.    Alycia Rossetti, PharmD  [facility]  Phone: [phone]  Fax: [phone]

## 2020-05-07 ENCOUNTER — Other Ambulatory Visit: Payer: Self-pay

## 2020-05-08 NOTE — Telephone Encounter (Signed)
Prior Authorization    Row Name 04/30/20 1001       Prior Authorization Information   Prior Authorization Needed Yes       Treatment/Medication Name Vumerity 231MG  dr capsules       Authorization Type Medication       Diagnosis Code G35       Prior Authorization Status Authorization approved       Authorization End Date 04/28/21       04/30/21 Name [employer]       Policy Number [employer]       Comments Per [id number] at [facility] specialty pharmacy active PA on file and insurance paid for medication

## 2020-05-16 ENCOUNTER — Other Ambulatory Visit: Payer: Self-pay

## 2020-05-20 ENCOUNTER — Other Ambulatory Visit: Payer: Self-pay

## 2020-05-20 ENCOUNTER — Encounter: Payer: Self-pay | Admitting: Primary Care

## 2020-05-20 DIAGNOSIS — E162 Hypoglycemia, unspecified: Secondary | ICD-10-CM

## 2020-05-22 ENCOUNTER — Other Ambulatory Visit: Payer: Self-pay

## 2020-05-22 ENCOUNTER — Telehealth: Payer: Self-pay

## 2020-05-22 NOTE — Telephone Encounter (Signed)
NA with in 2 weeks

## 2020-05-22 NOTE — Telephone Encounter (Signed)
Please help with referral    Farrell Ours, MD  [facility]  05/22/2020   7:23 AM

## 2020-05-22 NOTE — Telephone Encounter (Signed)
I have sen the patient the information to schedule her referral

## 2020-05-22 NOTE — Telephone Encounter (Signed)
hypoglycemia to 29 at night, non-diabetic. please eval for etiology  Ref by Farrell Ours, MD  Please review and advise

## 2020-05-22 NOTE — Provider Consult (Addendum)
Consulting Provider Response     Question:  Is any medication contributing to hypoglycemia?     Recommendations:  Given the time course and relationship to dimethyl fumarate, it is possible hypoglycemia related to diroximel fumarate.    Rationale:  Reviewed case with [facility] Pharmacist, Johnnye Sima. Patient recently began diroximel fumarate. Original plan was to increase dose on 05/16/20, though her treatment got delayed and the dose increase was re-scheduled until the evening of 12/14 or the morning of 12/15. There is no published literature suggesting this drug may cause hypoglycemia, though the agent is new to market.     Diroximel fumarate is pharmacologically similar to the older agent, dimethyl fumarate. Both Diroximel fumarate (Vumerity) and Dimethyl fumarate (Tecfidera ) get broken down into monomethyl fumarate which is the active metabolite. The FDA FAERs database contains 30 reports of hypoglycemia associated with dimethyl fumarate. Though these reports are often lacking details, 3 of the 6 provider-submitted cases which include concurrent medications involve patients that do not appear to be diabetic.     Thank you for this e-consult. Please contact me if you have further questions.     Milagros Loll, PharmD, MS-CI, [facility]  Clinical Pharmacy Specialist  [facility]  [phone]      This eConsult is focused on the specific clinical question(s) asked by the referring clinician, is based on the clinical data available to me, the consulting physician at the time of the request, and is furnished without benefit of a comprehensive evaluation of physical examination of the patient by me. The guidance set forth in the eConsult note will need to be interpreted in light of any clinical issues not known to me or any changes in patient status that I may not be aware of at the time of filing this eConsult. If further consultation is necessary, an in-person visit with me or another member of our  group is an option.

## 2020-05-22 NOTE — Telephone Encounter (Signed)
Attempt 1 of 2. No answer, left VM

## 2020-05-23 ENCOUNTER — Other Ambulatory Visit: Payer: Medicare (Managed Care) | Admitting: Primary Care

## 2020-05-23 NOTE — Progress Notes (Signed)
Ordering Provider Response       Reviewed via Earleen Reaper, encouraged to contact her neurologist  Hypoglycemia labs ordered too    Farrell Ours, MD  [facility]  05/23/2020  11:55 AM

## 2020-05-26 NOTE — Telephone Encounter (Signed)
Attempt 2 of 2. No answer, VM left

## 2020-05-27 ENCOUNTER — Ambulatory Visit: Payer: Medicare (Managed Care) | Attending: Primary Care

## 2020-05-27 DIAGNOSIS — Z23 Encounter for immunization: Secondary | ICD-10-CM | POA: Insufficient documentation

## 2020-05-27 DIAGNOSIS — R0681 Apnea, not elsewhere classified: Secondary | ICD-10-CM

## 2020-05-27 NOTE — Addendum Note (Signed)
Addended by: Dominic Pea on: 05/27/2020 03:41 PM     Modules accepted: Orders

## 2020-06-02 ENCOUNTER — Other Ambulatory Visit: Payer: Self-pay

## 2020-06-02 DIAGNOSIS — G35 Multiple sclerosis: Secondary | ICD-10-CM

## 2020-06-02 MED ORDER — VUMERITY 231 MG PO CPDR
231.0000 mg | DELAYED_RELEASE_CAPSULE | Freq: Two times a day (BID) | ORAL | 0 refills | Status: DC
Start: 2020-06-02 — End: 2020-07-07
  Filled 2020-06-02: qty 120, 30d supply, fill #0

## 2020-06-02 NOTE — Telephone Encounter (Signed)
I called patient talked to about hypoglycemic episode. She has been taking the medication with yogurt or with peanuts to offset hypoglycemia. It has not occurred again. Will refill her vumerity.

## 2020-06-03 ENCOUNTER — Other Ambulatory Visit: Payer: Self-pay

## 2020-06-05 ENCOUNTER — Other Ambulatory Visit: Payer: Self-pay

## 2020-06-05 ENCOUNTER — Encounter: Payer: Self-pay | Admitting: Primary Care

## 2020-06-05 DIAGNOSIS — F32A Depression, unspecified: Secondary | ICD-10-CM

## 2020-06-05 DIAGNOSIS — Z20822 Contact with and (suspected) exposure to covid-19: Secondary | ICD-10-CM

## 2020-06-05 NOTE — Telephone Encounter (Signed)
Last office visit:   04/10/2020  Patients upcoming appointments:  Future Appointments   Date Time Provider [place]   08/12/2020 10:45 AM Tera Mater, MD [place] None   09/25/2020 12:00 PM Charlann Lange, MD IMM None   10/13/2020  1:00 PM Johny Drilling, MD CA[facility] None       Recent Lab Values 02/17/2018   EXP DATE 04/06/18       Recent Lab results:  GENERAL CHEMISTRY   Recent Labs     12/03/19  1743 06/13/19  1201   NA 140 139   K 3.9 4.2   CL 102 102   CO2 24 27   GAP 14 10   UN 10 10   CREAT 0.82 0.82   GFRC 85 85   GFRB 98 98   GLU 109* 84   CA 9.8 9.6      LIPID PROFILE   Recent Labs     06/13/19  1201   CHOL 179   TRIG 45   HDL 73*   LDLC 97      LIVER PROFILE   Recent Labs     04/14/20  1506 12/03/19  1743 06/13/19  1201   ALT '30 20 28   ' AST CANCELED 15 18   ALK 82 65 78   TB 0.3 0.5 0.4      DIABETES THYROID   Recent Labs     06/13/19  1201   HA1C 5.1    No value within the past 365 days      Pending/Orders Labs:  Lab Frequency Next Occurrence   MR head without and with contrast Once 08/25/2020   Cortisol Once 05/23/2020   C-PEPTIDE Once 05/23/2020   Insulin, total Once 05/23/2020   Beta hydroxybuterate Once 05/23/2020   Glucose Once 05/23/2020   TSH Once 05/23/2020   CBC and differential SEE COMMENTS

## 2020-06-05 NOTE — Telephone Encounter (Signed)
Patient wants to be tested at [facility] on Monday, asking for order to be placed

## 2020-06-07 MED ORDER — DULOXETINE HCL 60 MG PO CPEP *I*
60.0000 mg | DELAYED_RELEASE_CAPSULE | Freq: Every day | ORAL | 3 refills | Status: DC
Start: 2020-06-07 — End: 2020-10-13

## 2020-06-10 ENCOUNTER — Other Ambulatory Visit: Payer: Self-pay

## 2020-06-10 ENCOUNTER — Ambulatory Visit: Payer: Medicare (Managed Care) | Attending: Nurse Practitioner

## 2020-06-10 DIAGNOSIS — Z20822 Contact with and (suspected) exposure to covid-19: Secondary | ICD-10-CM | POA: Insufficient documentation

## 2020-06-10 DIAGNOSIS — Z20828 Contact with and (suspected) exposure to other viral communicable diseases: Secondary | ICD-10-CM | POA: Insufficient documentation

## 2020-06-11 LAB — COVID-19 NAAT (PCR): COVID-19 NAAT (PCR): NEGATIVE

## 2020-06-11 LAB — COVID-19 PCR

## 2020-06-18 ENCOUNTER — Other Ambulatory Visit: Payer: Self-pay | Admitting: Adult Health

## 2020-06-18 DIAGNOSIS — G35 Multiple sclerosis: Secondary | ICD-10-CM

## 2020-06-29 ENCOUNTER — Other Ambulatory Visit: Payer: Self-pay | Admitting: Adult Health

## 2020-06-29 ENCOUNTER — Other Ambulatory Visit: Payer: Self-pay | Admitting: Family Medicine

## 2020-06-29 DIAGNOSIS — G35 Multiple sclerosis: Secondary | ICD-10-CM

## 2020-06-30 NOTE — Telephone Encounter (Signed)
Last office visit:   04/10/2020  Patients upcoming appointments:  Future Appointments   Date Time Provider [place]   08/12/2020 10:45 AM Tera Mater, MD [place] None   09/25/2020 12:00 PM Charlann Lange, MD I[facility] None   10/13/2020  1:00 PM Johny Drilling, MD [facility] None       Recent Lab Values 02/17/2018   EXP DATE 04/06/18       Recent Lab results:  GENERAL CHEMISTRY   Recent Labs     12/03/19  1743   NA 140   K 3.9   CL 102   CO2 24   GAP 14   UN 10   CREAT 0.82   GFRC 85   GFRB 98   GLU 109*   CA 9.8      LIPID PROFILE   No value within the past 365 days   LIVER PROFILE   Recent Labs     04/14/20  1506 12/03/19  1743   ALT 30 20   AST CANCELED 15   ALK 82 65   TB 0.3 0.5      DIABETES THYROID   No value within the past 365 days No value within the past 365 days      Pending/Orders Labs:  Lab Frequency Next Occurrence   MR head without and with contrast Once 08/25/2020   Cortisol Once 05/23/2020   C-PEPTIDE Once 05/23/2020   Insulin, total Once 05/23/2020   Beta hydroxybuterate Once 05/23/2020   Glucose Once 05/23/2020   TSH Once 05/23/2020   CBC and differential SEE COMMENTS

## 2020-07-03 MED ORDER — AMANTADINE HCL 100 MG PO TABS *A*
200.0000 mg | ORAL_TABLET | Freq: Every day | ORAL | 5 refills | Status: DC
Start: 2020-07-03 — End: 2021-02-04

## 2020-07-03 NOTE — Addendum Note (Signed)
Addended by: Alona Bene on: 07/03/2020 08:43 AM     Modules accepted: Orders

## 2020-07-03 NOTE — Telephone Encounter (Signed)
[facility] home shipping calling in stating this script was not received with a signature. Please resend

## 2020-07-03 NOTE — Telephone Encounter (Signed)
Order placed and sent to NP pool for signature.

## 2020-07-07 ENCOUNTER — Other Ambulatory Visit: Payer: Self-pay | Admitting: Neurology

## 2020-07-07 ENCOUNTER — Other Ambulatory Visit: Payer: Self-pay

## 2020-07-07 DIAGNOSIS — G35 Multiple sclerosis: Secondary | ICD-10-CM

## 2020-07-07 MED ORDER — VUMERITY 231 MG PO CPDR
231.0000 mg | DELAYED_RELEASE_CAPSULE | Freq: Two times a day (BID) | ORAL | 0 refills | Status: DC
Start: 2020-07-07 — End: 2020-08-20
  Filled 2020-07-07: qty 120, 30d supply, fill #0
  Filled 2020-07-22 – 2020-08-12 (×2): qty 120, 60d supply, fill #0

## 2020-07-08 ENCOUNTER — Other Ambulatory Visit: Payer: Self-pay

## 2020-07-08 ENCOUNTER — Encounter: Payer: Self-pay | Admitting: Neurology

## 2020-07-08 DIAGNOSIS — G35 Multiple sclerosis: Secondary | ICD-10-CM

## 2020-07-10 ENCOUNTER — Other Ambulatory Visit: Payer: Self-pay

## 2020-07-14 ENCOUNTER — Other Ambulatory Visit: Payer: Self-pay

## 2020-07-16 ENCOUNTER — Other Ambulatory Visit: Payer: Self-pay

## 2020-07-18 ENCOUNTER — Other Ambulatory Visit: Payer: Self-pay

## 2020-07-22 ENCOUNTER — Other Ambulatory Visit: Payer: Self-pay

## 2020-07-24 ENCOUNTER — Encounter: Payer: Self-pay | Admitting: Neurology

## 2020-07-25 ENCOUNTER — Other Ambulatory Visit: Payer: Self-pay

## 2020-07-25 ENCOUNTER — Other Ambulatory Visit: Payer: Self-pay | Admitting: Neurology

## 2020-07-25 DIAGNOSIS — G35 Multiple sclerosis: Secondary | ICD-10-CM

## 2020-07-25 MED ORDER — ONDANSETRON HCL 4 MG PO TABS *I*
4.0000 mg | ORAL_TABLET | Freq: Three times a day (TID) | ORAL | 1 refills | Status: DC | PRN
Start: 2020-07-25 — End: 2020-11-10

## 2020-07-25 NOTE — Telephone Encounter (Signed)
On February 1, Jodi Butler sent in a My Chart that she is having stomach issues from the Vumerity. She is asking for a anti-nausea med. The nausea is affecting her Blood sugar.   She has an appointment on 09/25/2020 with Dr. Hughes Better

## 2020-07-30 ENCOUNTER — Other Ambulatory Visit: Payer: Self-pay

## 2020-08-04 ENCOUNTER — Other Ambulatory Visit
Admission: RE | Admit: 2020-08-04 | Discharge: 2020-08-04 | Disposition: A | Discharge status: Home / Self Care | Payer: Medicare (Managed Care) | Source: Ambulatory Visit | Attending: Primary Care | Admitting: Primary Care

## 2020-08-04 DIAGNOSIS — E162 Hypoglycemia, unspecified: Secondary | ICD-10-CM | POA: Insufficient documentation

## 2020-08-04 DIAGNOSIS — G35 Multiple sclerosis: Secondary | ICD-10-CM | POA: Insufficient documentation

## 2020-08-04 LAB — C-PEPTIDE: C-Peptide: 3.1 ng/mL (ref 1.1–4.4)

## 2020-08-04 LAB — CBC AND DIFFERENTIAL
Baso # K/uL: 0.1 10*3/uL (ref 0.0–0.1)
Basophil %: 1.6 %
Eos # K/uL: 0.1 10*3/uL (ref 0.0–0.4)
Eosinophil %: 2.6 %
Hematocrit: 40 % (ref 34–45)
Hemoglobin: 13 g/dL (ref 11.2–15.7)
IMM Granulocytes #: 0 10*3/uL (ref 0.0–0.0)
IMM Granulocytes: 0.3 %
Lymph # K/uL: 1.4 10*3/uL (ref 1.2–3.7)
Lymphocyte %: 43.6 %
MCH: 30 pg (ref 26–32)
MCHC: 33 g/dL (ref 32–36)
MCV: 93 fL (ref 79–95)
Mono # K/uL: 0.3 10*3/uL (ref 0.2–0.9)
Monocyte %: 8 %
Neut # K/uL: 1.4 10*3/uL — ABNORMAL LOW (ref 1.6–6.1)
Nucl RBC # K/uL: 0 10*3/uL (ref 0.0–0.0)
Nucl RBC %: 0 /100 WBC (ref 0.0–0.2)
Platelets: 224 10*3/uL (ref 160–370)
RBC: 4.3 MIL/uL (ref 3.9–5.2)
RDW: 12.5 % (ref 11.7–14.4)
Seg Neut %: 43.9 %
WBC: 3.1 10*3/uL — ABNORMAL LOW (ref 4.0–10.0)

## 2020-08-04 LAB — HEPATIC FUNCTION PANEL
ALT: 31 U/L (ref 0–35)
AST: 30 U/L (ref 0–35)
Albumin: 4.5 g/dL (ref 3.5–5.2)
Alk Phos: 84 U/L (ref 35–105)
Bilirubin,Direct: <0.2 mg/dL (ref 0.0–0.3)
Bilirubin,Total: 0.3 mg/dL (ref 0.0–1.2)
Total Protein: 6.4 g/dL (ref 6.3–7.7)

## 2020-08-04 LAB — CORTISOL: Cortisol,Random: 18 ug/dL

## 2020-08-04 LAB — GLUCOSE: Glucose: 74 mg/dL (ref 60–99)

## 2020-08-04 LAB — BETA HYDROXYBUTYRATE: Beta Hydroxybutyrate: <0.05 mmol/L (ref 0.02–0.27)

## 2020-08-04 LAB — TSH: TSH: 1.33 u[IU]/mL (ref 0.27–4.20)

## 2020-08-04 LAB — INSULIN, TOTAL: Insulin: 11 u[IU]/mL (ref 3–25)

## 2020-08-04 LAB — MULTIPLE ORDERING DOCS

## 2020-08-05 ENCOUNTER — Encounter: Payer: Self-pay | Admitting: Primary Care

## 2020-08-06 ENCOUNTER — Telehealth: Payer: Self-pay | Admitting: Neurology

## 2020-08-06 ENCOUNTER — Encounter: Payer: Self-pay | Admitting: Emergency Medicine

## 2020-08-06 ENCOUNTER — Emergency Department: Payer: Medicare (Managed Care)

## 2020-08-06 ENCOUNTER — Emergency Department
Admission: EM | Admit: 2020-08-06 | Discharge: 2020-08-06 | Disposition: A | Discharge status: Home / Self Care | Payer: Medicare (Managed Care) | Source: Ambulatory Visit | Attending: Emergency Medicine | Admitting: Emergency Medicine

## 2020-08-06 ENCOUNTER — Emergency Department: Payer: Medicare (Managed Care) | Admitting: Radiology

## 2020-08-06 DIAGNOSIS — R1013 Epigastric pain: Secondary | ICD-10-CM | POA: Insufficient documentation

## 2020-08-06 DIAGNOSIS — R197 Diarrhea, unspecified: Secondary | ICD-10-CM | POA: Insufficient documentation

## 2020-08-06 DIAGNOSIS — R112 Nausea with vomiting, unspecified: Secondary | ICD-10-CM | POA: Insufficient documentation

## 2020-08-06 DIAGNOSIS — R1011 Right upper quadrant pain: Secondary | ICD-10-CM | POA: Insufficient documentation

## 2020-08-06 DIAGNOSIS — R109 Unspecified abdominal pain: Secondary | ICD-10-CM

## 2020-08-06 DIAGNOSIS — R350 Frequency of micturition: Secondary | ICD-10-CM

## 2020-08-06 DIAGNOSIS — R1032 Left lower quadrant pain: Secondary | ICD-10-CM

## 2020-08-06 DIAGNOSIS — Z20822 Contact with and (suspected) exposure to covid-19: Secondary | ICD-10-CM | POA: Insufficient documentation

## 2020-08-06 LAB — RUQ PANEL (ED ONLY)
ALT: 35 U/L (ref 0–35)
AST: 26 U/L (ref 0–35)
Albumin: 4.4 g/dL (ref 3.5–5.2)
Alk Phos: 79 U/L (ref 35–105)
Amylase: 81 U/L (ref 28–100)
Bilirubin,Direct: <0.2 mg/dL (ref 0.0–0.3)
Bilirubin,Total: 0.8 mg/dL (ref 0.0–1.2)
Lipase: 65 U/L — ABNORMAL HIGH (ref 13–60)
Total Protein: 6.6 g/dL (ref 6.3–7.7)

## 2020-08-06 LAB — CBC AND DIFFERENTIAL
Baso # K/uL: 0 10*3/uL (ref 0.0–0.1)
Basophil %: 0.4 %
Eos # K/uL: 0 10*3/uL (ref 0.0–0.4)
Eosinophil %: 0.2 %
Hematocrit: 43 % (ref 34–45)
Hemoglobin: 14.3 g/dL (ref 11.2–15.7)
IMM Granulocytes #: 0 10*3/uL (ref 0.0–0.0)
IMM Granulocytes: 0.4 %
Lymph # K/uL: 0.3 10*3/uL — ABNORMAL LOW (ref 1.2–3.7)
Lymphocyte %: 2.9 %
MCH: 31 pg (ref 26–32)
MCHC: 33 g/dL (ref 32–36)
MCV: 93 fL (ref 79–95)
Mono # K/uL: 0.3 10*3/uL (ref 0.2–0.9)
Monocyte %: 2.2 %
Neut # K/uL: 10.6 10*3/uL — ABNORMAL HIGH (ref 1.6–6.1)
Nucl RBC # K/uL: 0 10*3/uL (ref 0.0–0.0)
Nucl RBC %: 0 /100 WBC (ref 0.0–0.2)
Platelets: 245 10*3/uL (ref 160–370)
RBC: 4.6 MIL/uL (ref 3.9–5.2)
RDW: 12.4 % (ref 11.7–14.4)
Seg Neut %: 93.9 %
WBC: 11.3 10*3/uL — ABNORMAL HIGH (ref 4.0–10.0)

## 2020-08-06 LAB — PLASMA PROF 7 (ED ONLY)
Anion Gap,PL: 15 (ref 7–16)
CO2,Plasma: 21 mmol/L (ref 20–28)
Chloride,Plasma: 104 mmol/L (ref 96–108)
Creatinine: 0.61 mg/dL (ref 0.51–0.95)
Glucose,Plasma: 129 mg/dL — ABNORMAL HIGH (ref 60–99)
Potassium,Plasma: 3.9 mmol/L (ref 3.3–4.6)
Sodium,Plasma: 140 mmol/L (ref 133–145)
UN,Plasma: 11 mg/dL (ref 6–20)
eGFR BY CREAT: 110 *

## 2020-08-06 LAB — COVID-19 NAAT (PCR): COVID-19 NAAT (PCR): NEGATIVE

## 2020-08-06 LAB — COVID-19 PCR

## 2020-08-06 LAB — PERFORMING LAB

## 2020-08-06 LAB — PREGNANCY TEST, SERUM: Preg,Serum: NEGATIVE

## 2020-08-06 MED ORDER — DEXTROSE 5 % FLUSH FOR PUMPS *I*
0.0000 mL/h | INTRAVENOUS | Status: DC | PRN
Start: 2020-08-06 — End: 2020-08-07

## 2020-08-06 MED ORDER — SODIUM CHLORIDE 0.9 % IV BOLUS *I*
1000.0000 mL | Freq: Once | Status: AC
Start: 2020-08-06 — End: 2020-08-06
  Administered 2020-08-06: 1000 mL via INTRAVENOUS

## 2020-08-06 MED ORDER — SODIUM CHLORIDE 0.9 % FLUSH FOR PUMPS *I*
0.0000 mL/h | INTRAVENOUS | Status: DC | PRN
Start: 2020-08-06 — End: 2020-08-07

## 2020-08-06 MED ORDER — IOHEXOL 350 MG/ML (OMNIPAQUE) IV SOLN 500ML BOTTLE *I*
1.0000 mL | Freq: Once | INTRAVENOUS | Status: AC
Start: 2020-08-06 — End: 2020-08-06
  Administered 2020-08-06: 111 mL via INTRAVENOUS

## 2020-08-06 MED ORDER — PROMETHAZINE HCL 25 MG/ML IJ SOLN *I*
25.0000 mg | Freq: Once | INTRAMUSCULAR | Status: AC
Start: 2020-08-06 — End: 2020-08-06
  Administered 2020-08-06: 25 mg via INTRAVENOUS
  Filled 2020-08-06: qty 1

## 2020-08-06 MED ORDER — MORPHINE SULFATE 4 MG/ML IV SOLN *WRAPPED*
4.0000 mg | Freq: Once | INTRAVENOUS | Status: AC
Start: 2020-08-06 — End: 2020-08-06
  Administered 2020-08-06: 4 mg via INTRAVENOUS
  Filled 2020-08-06: qty 1

## 2020-08-06 MED ORDER — ONDANSETRON HCL 2 MG/ML IV SOLN *I*
4.0000 mg | Freq: Once | INTRAMUSCULAR | Status: AC
Start: 2020-08-06 — End: 2020-08-06
  Administered 2020-08-06: 4 mg via INTRAVENOUS
  Filled 2020-08-06: qty 2

## 2020-08-06 NOTE — Telephone Encounter (Signed)
Spoke with patient's wife [name]. She said patient started taking 2 capsules of Vumerity on Monday and was doing okay with the zofran until yesterday. [name] said yesterday morning, patient's stomach became upset and it progressed throughout the day. At 3 am this morning, she started throwing up and having diarrhea and c/o abdominal pain. [name] says she is currently in [facility] ED and they plan to do some abdominal scans. She also mentions that two days ago, Allis's white blood cell count was 3.1 and it's 11.4 now. She was concerned about it because it is a big jump and she can't be in the ED with Melvenia Beam to ask questions.     Will defer to Vivi Barrack, NP and Dr. Hughes Better.

## 2020-08-06 NOTE — ED Provider Notes (Addendum)
History     Chief Complaint   Patient presents with    Abdominal Pain     Pt is a 50 yo female with a hx of multiple sclerosis, depression, anxiety presenting with abdominal pain. Reports she woke up with abrupt severe abdominal pain predominantly in the epigastric region. She describes it as a sharp, tearing type pain. She has never had similar pain to this. She endorses associated nausea, vomiting and diarrhea. She does have some back pain associated with this but she thinks it is from sitting in the wheelchair for a few hours as she did not have this at onset. Prior hx of appendectomy but otherwise denies surgical history. She was concerned this could be related to her multiple sclerosis medication, recently switched to a new medication called vumerity. She does endorse urinary frequency but denies other urinary symptoms.       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    Neurogenic bladder N31.9    Patient has healthcare proxy Z78.9            Past Surgical History:   Procedure Laterality Date    APPENDECTOMY       Family History   Problem Relation Age of Onset    Multiple Sclerosis Father     Stroke Maternal Grandmother     Alzheimer's disease Maternal Grandmother     Hypertension Mother     Arthritis Mother     Breast cancer Paternal Aunt 75    Breast cancer Paternal Grandmother     Cancer Paternal Grandmother     Diabetes Paternal Grandmother     Lung cancer Paternal Uncle     Anxiety disorder Maternal Aunt     Bleeding problems  Maternal Aunt     Blood clots Maternal Aunt     Depression Maternal Aunt     Cancer Paternal Aunt     Cancer Paternal Uncle     Cancer Paternal Grandfather     Multiple Sclerosis Other     Multiple Sclerosis Other           Social History     Tobacco Use    Smoking status: Former Smoker     Packs/day: 1.00     Years: 5.00     Pack years: 5.00     Types: Cigarettes     Quit date: 06/07/1996     Years since quitting: 24.1    Smokeless tobacco: Never Used   Substance Use Topics    Alcohol use: Yes     Comment: Only special occasions    Drug use: No     Living Situation     Questions Responses    Patient lives with Significant Other    Homeless No    Caregiver for other family member No    External Services None    Employment Disabled    Domestic Violence Risk No  Review of Systems   Review of Systems   Constitutional: Negative for chills and fever.   HENT: Negative for facial swelling, rhinorrhea and sore throat.    Eyes: Negative for pain and redness.   Respiratory: Negative for cough and shortness of breath.    Cardiovascular: Negative for chest pain and palpitations.   Gastrointestinal: Positive for abdominal pain, diarrhea, nausea and vomiting. Negative for abdominal distention, blood in stool and constipation.   Genitourinary: Positive for frequency. Negative for dysuria, flank pain and urgency.   Musculoskeletal: Positive for back pain. Negative for neck pain.   Skin: Negative for color change, pallor and rash.   Neurological: Negative for dizziness, syncope, weakness and light-headedness.   Psychiatric/Behavioral: Negative for agitation and behavioral problems.       Physical Exam     Triage Vitals  Triage Start: Start, (08/06/20 1033)   First Recorded BP: 130/61, Resp: 16, Temp: 35.7 C (96.3 F) Oxygen Therapy SpO2: 98 %, O2 Device: None (Room air), Heart Rate: 67, (08/06/20 1034)  .      Physical Exam  Vitals and nursing note reviewed.   Constitutional:       General: She is not in  acute distress.     Appearance: She is well-developed. She is not diaphoretic.   HENT:      Head: Normocephalic and atraumatic.      Right Ear: External ear normal.      Left Ear: External ear normal.      Nose: Nose normal.   Eyes:      General:         Right eye: No discharge.         Left eye: No discharge.      Conjunctiva/sclera: Conjunctivae normal.      Pupils: Pupils are equal, round, and reactive to light.   Cardiovascular:      Rate and Rhythm: Normal rate and regular rhythm.      Heart sounds: Normal heart sounds. No murmur heard.    No friction rub. No gallop.   Pulmonary:      Effort: Pulmonary effort is normal. No respiratory distress.      Breath sounds: Normal breath sounds. No wheezing or rales.   Abdominal:      General: Bowel sounds are normal. There is no distension.      Palpations: Abdomen is soft.      Tenderness: There is abdominal tenderness in the right upper quadrant and epigastric area. There is no guarding or rebound. Positive signs include Murphy's sign. Negative signs include McBurney's sign.      Comments: There is also mild LLQ pain.    Musculoskeletal:         General: No tenderness. Normal range of motion.      Cervical back: Normal range of motion and neck supple.   Lymphadenopathy:      Cervical: No cervical adenopathy.   Skin:     General: Skin is warm and dry.      Coloration: Skin is not pale.      Findings: No erythema or rash.   Neurological:      Mental Status: She is alert and oriented to person, place, and time.   Psychiatric:         Behavior: Behavior normal.         Medical Decision Making   Patient seen by me on:  08/06/2020    Assessment:  Pt is a 50 yo female presenting with  upper abdominal pain, N/V/D, abrupt onset at 3 am this morning. Her history and exam are concerning for acute pancreatitis or cholecystitis versus gastritis/PUD. She recently started taking vumerity for multiple sclerosis and per uptodate this can be a common side effect particularly in the first  few months of taking this medication.     Differential diagnosis:  Gastritis, Pancreatitis, Cholecystitis, GERD, Adverse medication effect, PUD, UTI, less likely diverticulitis, AAA/dissection    Plan:  CBC, ED7, RUQ panel, HCG, UA  ED [place] team completed biliary, aortic and renal US which were unremarkable at bedside, will proceed with CTA of the abdomen and pelvis to assess further.   Morphine 4 mg IV, Zofran 4 mg IV, NS bolus      Independent review of: CT scans, labs, [place]              Telitha Plath, PA          Orlinda Blalock [place], [place]  08/06/20 1431       Riesa Pope, [place]  08/06/20 1436

## 2020-08-06 NOTE — ED Triage Notes (Signed)
Coming from home. C/o upper abdominal pain that woke her out of a sleep. +nausea/vomiting and diarrhea. Describes it as a ripping pain. 8/10 pain. Recently prescribed new medication.         Prehospital medications given: No

## 2020-08-06 NOTE — ED Provider Progress Notes (Signed)
Received sign out from [facility] pending results of CTA abdomen/pelvis:    No CTA evidence of acute aortic syndrome. No aortic dissection.      No acute abnormality in the abdomen or pelvis to explain patient's symptoms.         Discussed findings with patient. She did request another dose of nausea medication. IV phenergan given with some relief. Patient able to tolerate ginger ale. She is non toxic appearing. Will plan for discharge home with strict return precautions. Prior to discharge Covid PCR ordered as patient stated she did notice she was coughing yesterday. Advised patient to follow up with her PCP and Neurologist.      Deetta Perla, [place]  08/06/20 [phone]

## 2020-08-06 NOTE — ED Notes (Signed)
OHP x2

## 2020-08-06 NOTE — Telephone Encounter (Signed)
Pt's wife Jodi Butler called in to speak with nursing regarding side effects assumed from taking vumerity. Pt started experiencing the following at 3am this morning and got worse over the course of the day: vomiting, thought she had to go to bathroom but didn't, sweaty, hot, arms went numb, and was screaming in pain. Pt is en route to [facility] via paramedics. Pt's white blood count 3.1 Please advise    Jodi Butler can be reached at [phone]

## 2020-08-06 NOTE — Discharge Instructions (Signed)
Please follow up with your PCP and Neurologist.     The exact cause of your abdominal pain is unclear at this time however your CT scan of abdomen and pelvis did not show any emergent or abnormal findings to explain your symptoms.    Recommend tylenol as needed for pain. Continue zofran as needed for nausea. You may also have some relief with over the counter antacid such as Pepcid.    Return to the ER for any new or worsening symptoms.    You were tested for covid as you reported a cough yesterday. Please follow up results in mychart. Recommend self isolation until results of resting are known.

## 2020-08-07 ENCOUNTER — Telehealth: Payer: Self-pay

## 2020-08-07 ENCOUNTER — Telehealth: Payer: Self-pay | Admitting: Neurology

## 2020-08-07 MED ORDER — FAMOTIDINE 20 MG PO TABS *I*
20.0000 mg | ORAL_TABLET | Freq: Two times a day (BID) | ORAL | 5 refills | Status: DC
Start: 2020-08-07 — End: 2020-11-19

## 2020-08-07 NOTE — Telephone Encounter (Signed)
rx sent    Farrell Ours, MD  [facility]  08/07/2020  4:31 PM

## 2020-08-07 NOTE — Telephone Encounter (Signed)
Chart reviewed; appears that pt is stopping vumerity again due to abdominal pain. Will follow along for now    Farrell Ours, MD  [facility]  08/07/2020  4:50 PM

## 2020-08-07 NOTE — Telephone Encounter (Signed)
Patient's spouse called and asked if Dr. Christell Constant could send in a prescription for Pepcid to [facility]. Read [facility]. Patient's spouse stated they forgot to let the emergency department know to prescribe the Pepcid. Please advise

## 2020-08-07 NOTE — Telephone Encounter (Signed)
No mention of pepcid on after visit summary from ED , next visit 10/13/2020

## 2020-08-07 NOTE — Telephone Encounter (Signed)
-----   Message from Jodi Bodo, MD sent at 08/07/2020  1:09 PM EST -----  Regarding: Add on  Please add patient on to schedule Wednesday March 16 at 1 pm to Dr Tenna Delaine schedule. Patient is aware of the appointment and it will be telemedicine. Thank you!

## 2020-08-07 NOTE — Telephone Encounter (Signed)
I called and spoke with patients partner and made her aware the prescription has been sent.

## 2020-08-07 NOTE — Telephone Encounter (Signed)
Attempted to call Zniya to follow up. Her wife, [name], picked up.     She continues to feel the same as when she was discharged from the ED. She has not vomited since yesterday but she continues to have severe abdominal pain. She had decreased WBC count but in the hospital, it was elevated with high neutrophilic count. I suggested that she could have a viral illness that is causing her abdominal pain and emesis. Alternatively, it could also be due to vumerity     She took vumerity 2 nights ago but I told her she should stop vumerity. She should monitor her symptoms over the next few days to see if she recovers from the pain. In the mean time, we will have to consider a different DMT for her multiple sclerosis. I informed her that Dr. Hughes Better has an opening after 11:30 AM. They are opting to have a 1 pm telemedicine visit to discuss additional medication options.

## 2020-08-07 NOTE — Telephone Encounter (Signed)
All set!

## 2020-08-08 ENCOUNTER — Other Ambulatory Visit: Payer: Medicare (Managed Care)

## 2020-08-08 ENCOUNTER — Encounter: Payer: Self-pay | Admitting: Primary Care

## 2020-08-08 ENCOUNTER — Other Ambulatory Visit
Admission: RE | Admit: 2020-08-08 | Discharge: 2020-08-08 | Disposition: A | Discharge status: Home / Self Care | Payer: Medicare (Managed Care) | Source: Ambulatory Visit

## 2020-08-08 ENCOUNTER — Ambulatory Visit: Payer: Medicare (Managed Care) | Attending: Primary Care | Admitting: Primary Care

## 2020-08-08 VITALS — HR 82 | Ht 60.98 in | Wt 159.0 lb

## 2020-08-08 DIAGNOSIS — R1013 Epigastric pain: Secondary | ICD-10-CM

## 2020-08-08 LAB — URINALYSIS WITH MICROSCOPIC
Bacteria,UA: NONE SEEN
Glucose,UA: NEGATIVE
Nitrite,UA: NEGATIVE
Specific Gravity,UA: 1.023 (ref 1.002–1.030)
pH,UA: 6 (ref 5.0–8.0)

## 2020-08-08 LAB — COMPREHENSIVE METABOLIC PANEL
ALT: 38 U/L — ABNORMAL HIGH (ref 0–35)
AST: 34 U/L (ref 0–35)
Albumin: 4.4 g/dL (ref 3.5–5.2)
Alk Phos: 75 U/L (ref 35–105)
Anion Gap: 12 (ref 7–16)
Bilirubin,Total: 0.4 mg/dL (ref 0.0–1.2)
CO2: 27 mmol/L (ref 20–28)
Calcium: 9.4 mg/dL (ref 8.6–10.2)
Chloride: 103 mmol/L (ref 96–108)
Creatinine: 0.7 mg/dL (ref 0.51–0.95)
Glucose: 101 mg/dL — ABNORMAL HIGH (ref 60–99)
Lab: 7 mg/dL (ref 6–20)
Potassium: 3.6 mmol/L (ref 3.3–5.1)
Sodium: 142 mmol/L (ref 133–145)
Total Protein: 6.6 g/dL (ref 6.3–7.7)
eGFR BY CREAT: 106 *

## 2020-08-08 LAB — CBC AND DIFFERENTIAL
Baso # K/uL: 0 10*3/uL (ref 0.0–0.1)
Basophil %: 0.6 %
Eos # K/uL: 0 10*3/uL (ref 0.0–0.4)
Eosinophil %: 0.4 %
Hematocrit: 41 % (ref 34–45)
Hemoglobin: 13.3 g/dL (ref 11.2–15.7)
IMM Granulocytes #: 0 10*3/uL (ref 0.0–0.0)
IMM Granulocytes: 0.4 %
Lymph # K/uL: 1.3 10*3/uL (ref 1.2–3.7)
Lymphocyte %: 28.1 %
MCH: 30 pg (ref 26–32)
MCHC: 32 g/dL (ref 32–36)
MCV: 94 fL (ref 79–95)
Mono # K/uL: 0.5 10*3/uL (ref 0.2–0.9)
Monocyte %: 10.6 %
Neut # K/uL: 2.8 10*3/uL (ref 1.6–6.1)
Nucl RBC # K/uL: 0 10*3/uL (ref 0.0–0.0)
Nucl RBC %: 0 /100 WBC (ref 0.0–0.2)
Platelets: 204 10*3/uL (ref 160–370)
RBC: 4.4 MIL/uL (ref 3.9–5.2)
RDW: 12.7 % (ref 11.7–14.4)
Seg Neut %: 59.9 %
WBC: 4.6 10*3/uL (ref 4.0–10.0)

## 2020-08-08 LAB — LIPASE: Lipase: 34 U/L (ref 13–60)

## 2020-08-08 LAB — AMYLASE: Amylase: 50 U/L (ref 28–100)

## 2020-08-08 MED ORDER — PROMETHAZINE HCL 12.5 MG PO TABS *I*
12.5000 mg | ORAL_TABLET | Freq: Four times a day (QID) | ORAL | 0 refills | Status: DC | PRN
Start: 2020-08-08 — End: 2020-12-02

## 2020-08-08 NOTE — Progress Notes (Signed)
[facility]            SUBJECTIVE    Pt is here to discuss:    Chief Complaint   Patient presents with    Abdominal Pain     patient here for follow up for abdominal pain          1. Abdominal pain, nausea- started on Tuesday overnight . Woke up Wed AM with excrutiating pain in epigastrum and vomiting, was bent over in discomfort screaming out in pain while in bathroom.  Wife had to call EMS to get her to hospital. Also c/o hand/arm numbness and jello legs.    In ED was treated with zofran, phenergan, morphine. CTA of abdomen was normal. Labs notable for Lipase of 65, and leukocytosis of 11.3    Has persistent stomach pain especially with eating or drinking or taking meds. Describes a feeling of ripping pain through stomach and radiating to back.    Has continued the zofran and mother gave her omeprazole.      Had previously stopped the vumerity a few weeks ago due to difficulty with taking it with the correct foods. Restarted it last week after she noticed return of fatigue and hand numbness off of it. Now no longer taking it due to concern it is causing the abdominal pain.     Has not taken antidepressant x3d.      Denies any diarrhea, constipations  Subjective sweats and chills but no fever  Denies dysuria      PMH / Family Hx / Social Hx  Patient's medications, allergies, problem list, past medical, social histories were reviewed and notable for:       Current Outpatient Medications   Medication Sig    famotidine (PEPCID) 20 mg tablet Take 1 tablet (20 mg total) by mouth 2 times daily    amantadine (SYMMETREL) 100 mg tablet Take 2 tablets (200 mg total) by mouth daily  for Tiredness associated with Multiple Sclerosis    MYRBETRIQ 50 MG 24 hr tablet TAKE 1 TABLET BY MOUTH EVERY DAY    DULoxetine (CYMBALTA) 60 mg DR capsule Take 1 capsule (60 mg total) by mouth daily    cetirizine (ZYRTEC) 10 mg tablet Take 1 tablet (10 mg total) by mouth daily    buPROPion (WELLBUTRIN XL) 300 mg 24 hr tablet  TAKE 1 TABLET BY MOUTH EVERY MORNING SWALLOW WHOLE, DO NOT BREAK, CRUSH OR CHEW    DULoxetine (CYMBALTA) 30 mg DR capsule TAKE 1 CAPSULE BY MOUTH EVERY DAY TAKE WITH 60MG  DOSE TO TOTAL 90MG  DAILY. THIS IS REPLACING ESCITALOPRAM 10MG     fluticasone (FLONASE) 50 MCG/ACT nasal spray SPRAY 1 SPRAY INTO EACH NOSTRIL ONE TIME DAILY    ondansetron (ZOFRAN) 4 mg tablet Take 1 tablet (4 mg total) by mouth 3 times daily as needed  for Nausea and Vomiting    diroximel fumarate (VUMERITY) 231 MG DR capsule Take 2 capsules (462 mg) twice daily (Patient not taking: Reported on 08/08/2020)    tiZANidine (ZANAFLEX) 4 mg tablet TAKE 1 TABLET BY MOUTH EVERY 8 HOURS AS NEEDED FOR MUSCLE SPASMS    naproxen (NAPROSYN) 250 mg tablet TAKE 1 TABLET BY MOUTH TWO TIMES DAILY WITH MEALS FOR 14 DAYS (Patient not taking: Reported on 08/08/2020)    aspirin 81 MG EC tablet Take 1 tablet (81 mg total) by mouth 2 times daily  prior to Tecfidera (Patient not taking: Reported on 08/08/2020)    diazePAM (VALIUM) 5 MG tablet  Take 1 tab 1 hour prior to procedure, repeat later if still anxious. MDD 10mg     carbamide peroxide (DEBROX) 6.5 % otic solution Place 5 drops into both ears 2 times daily    triamcinolone (KENALOG) 0.1 % ointment Apply cream/ointment to areas on hands.          OBJECTIVE  Vitals:    08/08/20 1529   Pulse: 82   SpO2: 97%   Weight: 72.1 kg (159 lb)   Height: 1.549 m (5' 0.98")     Body mass index is 30.06 kg/m.      General: well-appearing Caucasian female, pleasant & conversant, in NAD  Eyes:. PERRLA. EOMI. Conjunctiva pink, without swelling or exudate. Lids normal. Sclera anicteric   Lungs: Normal resp effort. CTAB.  No crackles or wheezes.  Cardiac: RRR, no M/R/G. No pedal edema. Pulses 2+ b/l.     Abd: Soft, exquisitely TTP at epigastrum , umbilicus, suprapubic region. Normoactive BS.  No masses or organomegaly.  No   rebound tenderness. nontender in other quadrants.  Psych: AAOx3, normal affect and mood.  Insight and judgement intact.           ASSESSMENT & PLAN  1. Epigastric pain  - Comprehensive metabolic panel; Future  - Lipase; Future  - Amylase; Future  - CBC and differential; Future  - Urinalysis with microscopic; Future  - promethazine (PHENERGAN) 12.5 mg tablet; Take 1-2 tablets (12.5-25 mg total) by mouth 4 times daily as needed for Vomiting  Dispense: 30 tablet; Refill: 0  - REFLEX TO CULTURE; Future  - Urinalysis with microscopic  - REFLEX TO CULTURE    Ed notes and images reviewed.   Encouraged repeating labs as above to r/o smoldering or wosrening pancreatitis  Will get urine test to r/o cystitis, although doubtful it would create epigastric discomfort  Encouraged continued avoidance of vumerity  Encouraged zofran and phenergan, and omeprazole once daily in AM and pepcid in evening.   Encouraged clear liquid diet over weekend  Encouraged at least 1 dose of cymbalta and wellbutrin daily, to avoid withdrawal symptoms.   If worsening pain or inability to keep down liquids, may need to return to ED for repeat eval    Follow-up: pending labs      07-07-1983, MD  [facility]  08/08/2020  3:34 PM        ______________________

## 2020-08-09 ENCOUNTER — Encounter: Payer: Self-pay | Admitting: Primary Care

## 2020-08-11 ENCOUNTER — Telehealth: Payer: Self-pay

## 2020-08-11 LAB — RESOLUTION

## 2020-08-11 NOTE — Telephone Encounter (Signed)
Hilda Lias called from [facility] labs and said they cannot process the urine sample. They received a urine gray top and they cannot run a urine analysis off that. Please advise Hilda Lias [phone] option #1

## 2020-08-11 NOTE — Telephone Encounter (Signed)
Pt sent mychart over weekend, seems to be improving therefore I don't think we need to repeat urine study    WIll make pt aware in future mychart    Farrell Ours, MD  [facility]  08/11/2020  11:19 AM

## 2020-08-12 ENCOUNTER — Ambulatory Visit: Payer: Medicare (Managed Care) | Admitting: Sleep Medicine

## 2020-08-12 ENCOUNTER — Encounter: Payer: Self-pay | Admitting: Sleep Medicine

## 2020-08-12 ENCOUNTER — Other Ambulatory Visit: Payer: Self-pay

## 2020-08-13 ENCOUNTER — Other Ambulatory Visit: Payer: Self-pay

## 2020-08-13 MED ORDER — FLUTICASONE PROPIONATE 50 MCG/ACT NA SUSP *I*
NASAL | 1 refills | Status: DC
Start: 2020-08-13 — End: 2020-10-22

## 2020-08-13 NOTE — Telephone Encounter (Signed)
Last office visit:   08/08/2020  Patients upcoming appointments:  Future Appointments   Date Time Provider [place]   08/20/2020  1:00 PM Charlann Lange, MD [facility] None   09/25/2020 12:00 PM Charlann Lange, MD [facility] None   10/13/2020  1:00 PM Johny Drilling, MD [facility] None       Recent Lab Values 02/17/2018   EXP DATE 04/06/18       Recent Lab results:  GENERAL CHEMISTRY   Recent Labs     08/08/20  1635 08/06/20  1412 08/04/20  1047 12/03/19  1743   NA 142  --   --  140   K 3.6  --   --  3.9   CL 103  --   --  102   CO2 27  --   --  24   GAP 12  --   --  14   UN 7  --   --  10   CREAT 0.70 0.61  --  0.82   GFRC  --   --   --  85   GFRB  --   --   --  98   GLU 101*  --  74 109*   CA 9.4  --   --  9.8      LIPID PROFILE   No value within the past 365 days   LIVER PROFILE   Recent Labs     08/08/20  1635 08/06/20  1412 08/04/20  1047   ALT 38* 35 31   AST 34 26 30   ALK 75 79 84   TB 0.4 0.8 0.3      DIABETES THYROID   No value within the past 365 days Recent Labs     08/04/20  1047   TSH 1.33         Pending/Orders Labs:  Lab Frequency Next Occurrence   MR head without and with contrast Once 08/25/2020   CBC and differential SEE COMMENTS

## 2020-08-14 ENCOUNTER — Other Ambulatory Visit: Payer: Self-pay

## 2020-08-18 ENCOUNTER — Other Ambulatory Visit: Payer: Self-pay

## 2020-08-20 ENCOUNTER — Other Ambulatory Visit: Payer: Self-pay

## 2020-08-20 ENCOUNTER — Ambulatory Visit: Payer: Medicare (Managed Care) | Attending: Neurology | Admitting: Neurology

## 2020-08-20 ENCOUNTER — Encounter: Payer: Self-pay | Admitting: Neurology

## 2020-08-20 DIAGNOSIS — G35 Multiple sclerosis: Secondary | ICD-10-CM | POA: Insufficient documentation

## 2020-08-20 NOTE — Progress Notes (Signed)
Video Visit     Location of Patient: home  Location of Telemedicine Provider: home / other  Other participants in telemedicine encounter and roles:  none  This is an established patient visit  Reason for visit: No chief complaint on file.    HPI  I saw Jodi Butler in follow-up for her multiple sclerosis.    Disease summary:  She is a 50 y.o.who had an attack of diplopia and left facial weakness in 1998 with a lesion in the pons, and was diagnosed with MS in 2003 after an attack of bilateral leg weakness and sensory symptoms, with new brain lesions typical of MS and CSF with oligoclonal bands (and subsequent negative AQP4 and MOG serologies). She was treated with glatiramer acetate from 2004 to 2021 and had no further attacks but struggled with faitigue, spasms and cognitive symptoms (due to which she is on disability).  She switched to Tecfidera in July 2021 after MRI showed a new cervical spine lesion that had developed while on glatiramer acetate, developed intolerable GI symptoms after 4-5 months on it and tried a switch to Vumerity which she had to stop due to severe stomach pains and nausea.    Interval history:  She tried Vumerity but had intolerable stomach pain and vomiting even on the lower starting dose - stopped and symptoms improved, re-challenged and symptoms returned, worse to the point that she went to the ED with epigastric pain 2 weeks ago.  She stopped Vumerity at that point and symptoms have completely resolved.  She's had no new or worsening neurologic symptoms - after stopping Vumerity she felt the return of mild arm njumbness but this has not persisted.     She follows up today to discuss other treatment options.    Patient's problem list, allergies, and medications were reviewed and updated as appropriate.  Please see the EHR for full details.    Exam and data reviewed:  She looks well, is alert and attentive with normal language and excellent recall. Face symmetric, no dysarthria, arm  movements symmetric, gait stable.     I reviewed bloodwork from 08/08/20 that showed normal WBC and llymphocytes (1.3 k/l), LFTs normal except for minimally-elevated ALT (38)., Prior bloodwork on  12/03/19 showed negative VZV IgG, but she reports having had chicken pox earlier in adulthood as well as shingles (04/2017).  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    Assessment & Plan:  Multiple sclerosis without symptoms of clinical relapse through trials of Tecfidera (4 months) and a short trial of Vumerity, both of which were intolerable due to stomach pain, after MRI evidence of breakthrough activity during prior treatment with glatiramer acetate.  She does not tolerate fumarates and we discussed alternative immunotherapies, focusing on S1P modulators given their favorable side effect profile and increased efficacy compared to glatiramer acetate.  We discussed benefits and risks including immunosuppression and cardiac effects, and agreed to plan to proceed with ponesimod (due to ease of initial dosing compared to Gilenya) pending baseline testing and insurance approval.    -Plan to start ponesimod, after she considers further and discusses this with her wife.  -Baseline EKG (for QTc and/or arrhythmia that could warrant concern with ponesimod) and ophthalmologic exam to screen for macular edema.  -Send ponesimod patient information and enrollment forms.  -Baseline bloodwork is sufficient.  Despite negative VZV IgG, does not need primary VZV vaccination given clinical history of chicken pox and subsequent shingles.   -Recommend Shingrix zoster vaccine and COVID booster (shot #4, given  immunosuppressive treatment) prior to starting ponesimod - she will pursue this through Dr. Christell Constant.  -reschedule MRI brain for May, and plan to follow up via telemedicine after that.    Jodi Saucer, MD  __________________________  I personally spent 45 minutes on the calendar day of the encounter, including pre and post visit work.  Consent was  obtained from the patient to complete this video visit; including the potential for financial liability.

## 2020-08-20 NOTE — Patient Instructions (Signed)
Please reach out to the clinic with any additional questions or concerns. We encourage you to sign up and use MyChart for any non-urgent medical questions as MyChart is the preferred method of communication.  Please contact your pharmacy for medication refills and allow up to 2 business days for routine prescription refills.  For urgent messages, please call the clinic at [phone].  The clinic is open from 8am to 4:30pm Monday through Friday.

## 2020-08-21 ENCOUNTER — Telehealth: Payer: Self-pay | Admitting: Primary Care

## 2020-08-21 ENCOUNTER — Other Ambulatory Visit: Payer: Self-pay

## 2020-08-21 ENCOUNTER — Telehealth: Payer: Self-pay | Admitting: Neurology

## 2020-08-21 NOTE — Telephone Encounter (Signed)
Please let pt know I received neurology note    Per pt's neurologist, pt starting new immunosuppressant medicaiton  They want her to have shingrix and COVID #4 prior to medication    Pt does indeed qualify for a 4th COVID vaccine, must be at least 3 months after 3rd dose. Pt can schedule appt for COVID booster and shingrix anytime after 3/21

## 2020-08-21 NOTE — Telephone Encounter (Signed)
Per MD Belizzi's checkout notes on 08/20/20    Follow up in about 2 months (around 10/30/2020) for Telemedicine Follow-up.    In-person appt 4/21 can be cancelled, and switched to telemedicine in May    Writer attempted to contact patient in regards to scheduling her next appointment in May 2022 with MD via video    Writer left VM and sent patient a mychart message

## 2020-08-22 ENCOUNTER — Encounter: Payer: Self-pay | Admitting: Neurology

## 2020-08-22 NOTE — Telephone Encounter (Signed)
Will defer pt's MyChart message to Dr. Bellizzi.

## 2020-08-22 NOTE — Telephone Encounter (Signed)
I called the patient left a voicemail when the patient calls back see message below an schedule the patient

## 2020-08-25 NOTE — Telephone Encounter (Signed)
2nd attempt. When the patient calls back please see message below.

## 2020-08-26 ENCOUNTER — Telehealth: Payer: Self-pay

## 2020-08-26 NOTE — Telephone Encounter (Signed)
We reiceved paperwork from Dr. Hughes Better for Ponvory but per patient message looks like she has decided she would prefer Copaxone.  Is she clear to start?  If so, please send Rx to [facility]    Thanks!

## 2020-08-27 ENCOUNTER — Other Ambulatory Visit: Payer: Self-pay | Admitting: Adult Health

## 2020-08-27 ENCOUNTER — Encounter: Payer: Self-pay | Admitting: Neurology

## 2020-08-27 NOTE — Telephone Encounter (Signed)
I called the patient and left her a message to call our office back. This is the 3rd attempt. I also left her an unable to reach message on her Mychart. Please see message below when the patient calls back.

## 2020-08-28 ENCOUNTER — Other Ambulatory Visit: Payer: Self-pay

## 2020-08-28 NOTE — Telephone Encounter (Signed)
Will defer pt's MyChart message to Pam Clark, NP.

## 2020-09-02 ENCOUNTER — Encounter: Payer: Self-pay | Admitting: Neurology

## 2020-09-04 NOTE — Telephone Encounter (Signed)
Patient sent additional message about starting copaxone via MyChart. Will defer to Vivi Barrack, NP/Dr. Hughes Better

## 2020-09-05 ENCOUNTER — Encounter: Payer: Self-pay | Admitting: Gastroenterology

## 2020-09-08 ENCOUNTER — Other Ambulatory Visit: Payer: Self-pay

## 2020-09-11 ENCOUNTER — Encounter: Payer: Self-pay | Admitting: Neurology

## 2020-09-12 ENCOUNTER — Other Ambulatory Visit: Payer: Self-pay | Admitting: Neurology

## 2020-09-12 DIAGNOSIS — G35 Multiple sclerosis: Secondary | ICD-10-CM

## 2020-09-12 MED ORDER — GLATIRAMER ACETATE 40 MG/ML SC SOSY *I*
40.0000 mg | PREFILLED_SYRINGE | SUBCUTANEOUS | 5 refills | Status: DC
Start: 2020-09-12 — End: 2020-09-15

## 2020-09-12 NOTE — Telephone Encounter (Signed)
Will defer MyChart message to Dr. Bellizzi/NP pool.

## 2020-09-15 ENCOUNTER — Other Ambulatory Visit: Payer: Self-pay

## 2020-09-15 MED ORDER — GLATIRAMER ACETATE 40 MG/ML SC SOSY *I*
40.0000 mg | PREFILLED_SYRINGE | SUBCUTANEOUS | 5 refills | Status: DC
Start: 2020-09-15 — End: 2020-09-19
  Filled 2020-09-15: qty 12, 28d supply, fill #0

## 2020-09-15 NOTE — Progress Notes (Signed)
We received Jodi Butler's prescription for Glatiramer acetate (Copaxone) 40 mg. Confirmed with patient's insurance that she can fill at [facility]. Service request form not required for generic medication.     Prior authorization is not required.    Thank you,  Armando Reichert, PharmD  [facility]  [phone]

## 2020-09-15 NOTE — Progress Notes (Signed)
Spoke with Jodi Butler regarding prescription for Glatiramer acetate (Copaxone) for multiple sclerosis on the phone.    Initial assessment included review of interdisciplinary team documentation on physical health, social, environmental, economic, functional and mental components relevant to the patient's ability to adhere to the care plan with [facility].    Administration, treatment goals, duration of therapy, adherence strategies, side effects, safety precautions, contraindications, missed dose management, handling, storage and disposal reviewed with the patient.    Administration instructions: Glatiramer acetate 40 mg SC three times weekly    Planned Start Date: 09/19/20    Adherence plan: Combine with daily tasks, electronic reminder    Baseline testing reviewed: Baseline lab testing not recommended    Lab schedule: Routine lab monitoring not recommended    Specialty Pharmacy: [facility]; Delivery Date: 09/18/20; Copay: $0    Medication list reviewed; list is up to date. No significant interactions identified. I advised her to check with a pharmacist before starting any herbal supplements, medications or over the counter products.    Current Outpatient Medications   Medication Sig    glatiramer 40 mg/mL prefilled syringe Inject 1 mL (40 mg total) into the skin three times a week    fluticasone (FLONASE) 50 MCG/ACT nasal spray SPRAY 1 SPRAY INTO EACH NOSTRIL ONE TIME DAILY    promethazine (PHENERGAN) 12.5 mg tablet Take 1-2 tablets (12.5-25 mg total) by mouth 4 times daily as needed for Vomiting    famotidine (PEPCID) 20 mg tablet Take 1 tablet (20 mg total) by mouth 2 times daily    ondansetron (ZOFRAN) 4 mg tablet Take 1 tablet (4 mg total) by mouth 3 times daily as needed  for Nausea and Vomiting    amantadine (SYMMETREL) 100 mg tablet Take 2 tablets (200 mg total) by mouth daily  for Tiredness associated with Multiple Sclerosis    tiZANidine (ZANAFLEX) 4 mg  tablet TAKE 1 TABLET BY MOUTH EVERY 8 HOURS AS NEEDED FOR MUSCLE SPASMS    MYRBETRIQ 50 MG 24 hr tablet TAKE 1 TABLET BY MOUTH EVERY DAY    DULoxetine (CYMBALTA) 60 mg DR capsule Take 1 capsule (60 mg total) by mouth daily    cetirizine (ZYRTEC) 10 mg tablet Take 1 tablet (10 mg total) by mouth daily    buPROPion (WELLBUTRIN XL) 300 mg 24 hr tablet TAKE 1 TABLET BY MOUTH EVERY MORNING SWALLOW WHOLE, DO NOT BREAK, CRUSH OR CHEW    DULoxetine (CYMBALTA) 30 mg DR capsule TAKE 1 CAPSULE BY MOUTH EVERY DAY TAKE WITH 60MG  DOSE TO TOTAL 90MG  DAILY. THIS IS REPLACING ESCITALOPRAM 10MG     diazePAM (VALIUM) 5 MG tablet Take 1 tab 1 hour prior to procedure, repeat later if still anxious. MDD 10mg     carbamide peroxide (DEBROX) 6.5 % otic solution Place 5 drops into both ears 2 times daily    triamcinolone (KENALOG) 0.1 % ointment Apply cream/ointment to areas on hands.       Plan to follow up with patient one week after start. Encouraged her to call with any questions or concerns.    Thank you for allowing me to participate in the care of this patient. Please contact me with any questions.    , PharmD  [facility]  Phone: [phone]  Fax: [phone]

## 2020-09-19 ENCOUNTER — Other Ambulatory Visit: Payer: Self-pay

## 2020-09-19 MED ORDER — GLATIRAMER ACETATE 40 MG/ML SC SOSY *I*
40.0000 mg | PREFILLED_SYRINGE | SUBCUTANEOUS | 5 refills | Status: DC
Start: 2020-09-19 — End: 2021-04-08
  Filled 2020-09-19 – 2020-10-06 (×2): qty 12, 28d supply, fill #0
  Filled 2020-11-07: qty 12, 28d supply, fill #1
  Filled 2020-12-10: qty 12, 28d supply, fill #2
  Filled 2021-01-12: qty 12, 28d supply, fill #3
  Filled 2021-02-10: qty 12, 28d supply, fill #4
  Filled 2021-03-11: qty 12, 28d supply, fill #5

## 2020-09-21 ENCOUNTER — Other Ambulatory Visit: Payer: Medicare (Managed Care) | Admitting: Radiology

## 2020-09-25 ENCOUNTER — Encounter: Payer: Self-pay | Admitting: Primary Care

## 2020-09-25 ENCOUNTER — Ambulatory Visit: Payer: Medicare (Managed Care) | Attending: Neurology | Admitting: Neurology

## 2020-09-25 DIAGNOSIS — G35 Multiple sclerosis: Secondary | ICD-10-CM | POA: Insufficient documentation

## 2020-09-25 NOTE — Progress Notes (Signed)
Video Visit     Location of Patient: home  Location of Telemedicine Provider: home / other  Other participants in telemedicine encounter and roles:  [name] (spouse)  This is an established patient visit  Reason for visit: Multiple Sclerosis    HPI  I saw Jodi Butler in follow-up for her multiple sclerosis.    Disease summary:  She is a 50 y.o.who had an attack of diplopia and left facial weakness in 1998 with a lesion in the pons, and was diagnosed with MS in 2003 after an attack of bilateral leg weakness and sensory symptoms, with new brain lesions typical of MS and CSF with oligoclonal bands (and subsequent negative AQP4 and MOG serologies). She was treated with glatiramer acetate from 2004 to 2021 and had no further attacks but struggled with faitigue, spasms and cognitive symptoms (due to which she is on disability).  She switched to Tecfidera in July 2021 after MRI showed a new cervical spine lesion that had developed while on glatiramer acetate, developed intolerable GI symptoms after 4-5 months on it and tried a switch to Vumerity which she had to stop due to severe abdominal pains and nausea.    Interval history:  She's feeling better now from the severe GI symptoms related to Vumerity.  No new neurologic symptoms.  After thinking over the discussion about Ponvory at her last visit, she chose not to start it because of continued worry about the possibility of PML and other infections, preferring to go back to glatiramer acetate instead. She resumed this last week, with [name] giving her injections via syringe (no autoinjector) - she finds that the small syringe is difficult to handle, and had previously had a collar provided by the company that she could slide over the syringe to provide a flange for better grip and control.  Would like another for these syringes.    Patient's problem list, allergies, and medications were reviewed and updated as appropriate.  Please see the EHR for full details.    Exam and  data reviewed:  She looks well, is alert and attentive with normal language and excellent recall. Face symmetric, no dysarthria.  Further exam not done.    Assessment & Plan:  Multiple sclerosis that has been clinically stable through trials of Tecfidera and Vumerity that were stopped due to GI side effects, now recently back on glatiramer acetate.  Though she did have a new cervical spine lesion that developed while previously on glatiramer acetate, she had been clinically stable over 17 years and it is reasonable to continue given her intolerance of fumarates and concerns with risks of more potent immunosuppressive treatments.     -Continue glatiramer acetate 40 mg 3 times weekly.  She will contact the specialty pharmacy to ask about a collar to put over the syringes (not the autoinjector) to make them easier to handle.  She had previously had 1 of these from the manufacturer.  -MRI brain and cervical spine in August to evaluate for disease activity.  -She is interested in adjusting the dose of her Wellbutrin, and will contact Dr. Christell Constant about this.  -Follow-up via telemedicine in late August or September after the MRI.    [name], MD  __________________________  I personally spent 25 minutes on the calendar day of the encounter, including pre and post visit work.  Consent was obtained from the patient to complete this video visit; including the potential for financial liability.

## 2020-09-26 ENCOUNTER — Telehealth: Payer: Self-pay | Admitting: Neurology

## 2020-09-26 NOTE — Telephone Encounter (Signed)
Per MD Belizzi's checkout notes on 09/25/20    Follow up in about 18 weeks (around 01/29/2021) for Telemedicine Follow-up.    Writer attempted to contact patient in regards to scheduling patient her next appointment with MD in 5 months    Writer left VM and sent patient a mychart message

## 2020-09-29 ENCOUNTER — Other Ambulatory Visit: Payer: Self-pay

## 2020-10-05 NOTE — Patient Instructions (Signed)
Please reach out to the clinic with any additional questions or concerns. We encourage you to sign up and use MyChart for any non-urgent medical questions as MyChart is the preferred method of communication.  Please contact your pharmacy for medication refills and allow up to 2 business days for routine prescription refills.  For urgent messages, please call the clinic at [phone].  The clinic is open from 8am to 4:30pm Monday through Friday.

## 2020-10-06 ENCOUNTER — Other Ambulatory Visit: Payer: Self-pay

## 2020-10-08 ENCOUNTER — Other Ambulatory Visit: Payer: Self-pay

## 2020-10-13 ENCOUNTER — Encounter: Payer: Self-pay | Admitting: Primary Care

## 2020-10-13 ENCOUNTER — Ambulatory Visit: Payer: Medicare (Managed Care) | Admitting: Primary Care

## 2020-10-13 VITALS — BP 128/68 | HR 83 | Ht 60.98 in | Wt 159.0 lb

## 2020-10-13 DIAGNOSIS — Z0000 Encounter for general adult medical examination without abnormal findings: Secondary | ICD-10-CM

## 2020-10-13 DIAGNOSIS — F32A Depression, unspecified: Secondary | ICD-10-CM

## 2020-10-13 DIAGNOSIS — Z1211 Encounter for screening for malignant neoplasm of colon: Secondary | ICD-10-CM

## 2020-10-13 DIAGNOSIS — G35 Multiple sclerosis: Secondary | ICD-10-CM

## 2020-10-13 MED ORDER — VENLAFAXINE HCL 75 MG PO CP24 *I*
75.0000 mg | ORAL_CAPSULE | Freq: Every day | ORAL | 0 refills | Status: DC
Start: 2020-10-13 — End: 2020-10-30

## 2020-10-13 MED ORDER — VENLAFAXINE HCL 150 MG PO CP24 *I*
150.0000 mg | ORAL_CAPSULE | Freq: Every day | ORAL | 0 refills | Status: DC
Start: 2020-10-13 — End: 2020-10-30

## 2020-10-13 NOTE — Patient Instructions (Signed)
Thank you for completing your Subsequent Annual Medicare Visit   with Jodi Butler today.     The purpose of this visits was to:     Screen for disease   Assess risk of future medical problems   Help develop a healthy lifestyle   Update vaccines   Get to know your doctor in case of an illness    Patient Care Team:  Farrell Ours, MD as PCP - Charlyne Quale, MD  Elinor Dodge as Health Home Care Manager - Internal  Pershing Proud, Oswaldo Conroy, MD as Obstetrician (Obstetrics and Gynecology)  Kellie Moor, Dayton Scrape, MD (Neurology)  [name], MD as Consulting Provider (Neurology)  Armando Reichert, PharmD (Pharmacist)     Medicare 5 Year Plan    The following items were identified as areas of concern during your screening today:  BMI greater than 25 - This is a risk for Heart Attack, Stroke, High Blood Pressure, Diabetes, High Cholesterol and other complications.       The Health Maintenance table below identifies screening tests and immunizations recommended by your health care team:  Health Maintenance: These screening recommendations are based on USPSTF, [employer], and [place] state guidelines   Topic Date Due    Colon Cancer Screening  Never done    DEPRESSION SCREEN MONTHLY  02/14/2020    HIV Screening  01/13/2021 (Originally 01/30/1984)    Breast Cancer Screening  11/29/2021    Cervical Cancer Screening  06/16/2023    Flu Shot  Completed    Hepatitis C Screening  Completed    COVID-19 Vaccine  Completed     In addition, goals and orders placed to address these recommendations are listed in the "Today's Visit" section.    We wish you the best of health and look forward to seeing you again next year for your Annual Medicare Wellness Visit.     If you have any health care concerns before then, please do not hesitate to contact Jodi Butler.

## 2020-10-13 NOTE — Progress Notes (Signed)
[facility]         SUBJECTIVE    Pt is here to discuss:    Chief Complaint   Patient presents with    Subsequent Annual Medicare Visit    Depression         1. Depression , multiple sclerosis- transitioned back to copaxone after attempt at vumerity led to hypoglycemia and abdominal pain. Admits today she's struggling with finding her purpose. Has a health aide to assist with housekeeping, meals, and even supervise during showers. Finds she's not feeling motivated to do things around her house and will spend most of her time on her phone. Wife confirms more irritability, stressors from her mother who lives 2 doors down, and some impulsive behaviors. For example, she put a down payment down for car while wife was away on vacation, despite her telling pt not to buy it; pt admits she bought it b/c of trunk space for her scooter and wanting to stay ahead of any impending disability.            PMH / Family Hx / Social Hx  Patient's medications, allergies, problem list, past medical, social histories were reviewed and notable for:       Current Outpatient Medications   Medication Sig    glatiramer 40 mg/mL prefilled syringe Inject 1 mL (40 mg total) into the skin three times a week    fluticasone (FLONASE) 50 MCG/ACT nasal spray SPRAY 1 SPRAY INTO EACH NOSTRIL ONE TIME DAILY    famotidine (PEPCID) 20 mg tablet Take 1 tablet (20 mg total) by mouth 2 times daily    amantadine (SYMMETREL) 100 mg tablet Take 2 tablets (200 mg total) by mouth daily  for Tiredness associated with Multiple Sclerosis    MYRBETRIQ 50 MG 24 hr tablet TAKE 1 TABLET BY MOUTH EVERY DAY    cetirizine (ZYRTEC) 10 mg tablet Take 1 tablet (10 mg total) by mouth daily    buPROPion (WELLBUTRIN XL) 300 mg 24 hr tablet TAKE 1 TABLET BY MOUTH EVERY MORNING SWALLOW WHOLE, DO NOT BREAK, CRUSH OR CHEW    triamcinolone (KENALOG) 0.1 % ointment Apply cream/ointment to areas on hands.    venlafaxine (EFFEXOR-XR) 75 mg 24 hr capsule Take 1  capsule (75 mg total) by mouth daily  Take in morning with 150mg  dose to total 225mg  daily.  Swallow whole. Do not crush or chew.    venlafaxine (EFFEXOR-XR) 150 mg 24 hr capsule Take 1 capsule (150 mg total) by mouth daily  Take in morning with 75mg  dose to total 225mg  daily. Swallow whole. Do not crush or chew.    promethazine (PHENERGAN) 12.5 mg tablet Take 1-2 tablets (12.5-25 mg total) by mouth 4 times daily as needed for Vomiting    ondansetron (ZOFRAN) 4 mg tablet Take 1 tablet (4 mg total) by mouth 3 times daily as needed  for Nausea and Vomiting    tiZANidine (ZANAFLEX) 4 mg tablet TAKE 1 TABLET BY MOUTH EVERY 8 HOURS AS NEEDED FOR MUSCLE SPASMS    diazePAM (VALIUM) 5 MG tablet Take 1 tab 1 hour prior to procedure, repeat later if still anxious. MDD 10mg     carbamide peroxide (DEBROX) 6.5 % otic solution Place 5 drops into both ears 2 times daily            OBJECTIVE  Vitals:    10/13/20 1311   BP: 128/68   BP Location: Left arm   Patient Position: Sitting   Pulse:  83   SpO2: 99%   Weight: 72.1 kg (159 lb)   Height: 1.549 m (5' 0.98")     Body mass index is 30.06 kg/m.      General: well-appearing Caucasian female, pleasant & conversant, in NAD   Psych: AAOx3, normal affect and mood. Insight and judgement intact.       Recent Review Flowsheet Data     PHQ Scores 10/13/2020 01/14/2020 10/12/2019 10/12/2019 04/09/2019 10/10/2018 06/15/2018    PSQ2 Q1 - Interest/Pleasure - N Y Y N N N    PSQ2 Q2 - Down, Depressed, Hopeless - N Y - N N N    PHQ Q9 - Better Off Dead 0 - 1 - 0 - 0    PHQ Calculated Score 7 - 15 - 6 - 6            ASSESSMENT & PLAN  1. Depression, unspecified depression type  - venlafaxine (EFFEXOR-XR) 75 mg 24 hr capsule; Take 1 capsule (75 mg total) by mouth daily  Take in morning with 150mg  dose to total 225mg  daily.  Swallow whole. Do not crush or chew.  Dispense: 30 capsule; Refill: 0  - venlafaxine (EFFEXOR-XR) 150 mg 24 hr capsule; Take 1 capsule (150 mg total) by mouth daily  Take in  morning with 75mg  dose to total 225mg  daily. Swallow whole. Do not crush or chew.  Dispense: 30 capsule; Refill: 0  - AMB REFERRAL TO PSYCHIATRY  2. Multiple sclerosis    Recent exacerbation of depression symptoms, in the setting of progression of Mult Sclerosis and SE of alternative treatment offered. Wife and pt anxious about continued progression of disease and cognitive effects of it. Reviewed this anxiety may be causing depression symptoms too. Encouraged transition from cymbalta to effexor given she has been on cymbalta >69yrs. If no benefit, would recommend change to SSRI category given she is nearly on max dose wellbutrin already for augmentation. Encouraged involvement in therapy; referral done today. F/u 23mo to review effect.    3. Screening for colon cancer  - Cologuard (External Lab - [facility])    4. Preventative health care  AWV completed      Follow-up: 4mo re: depresssion      , MD  [facility]  10/13/2020  2:24 PM        ______________________

## 2020-10-13 NOTE — Progress Notes (Signed)
Visit performed as:             Office Visit, met with patient in person    Today we reviewed and updated Jodi Butler smoking status, activities of daily living, depression screen, fall risk, medications and allergies.   I have counseled the patient in the above areas.     Subjective:     Chief Complaint: Jodi Butler is a 50 y.o. female here for a/an Subsequent Annual Medicare Visit    In general, Jodi Butler rates their overall health as:  fair      Patient Care Team:  Johny Drilling, MD as PCP - Maudie Flakes, MD  Arvil Persons as [place] - Internal  Jannifer Hick, Dani Gobble, MD as Obstetrician (Obstetrics and Gynecology)  Jorja Loa, Brooks Sailors, MD (Neurology)  Charlann Lange, MD as Consulting Provider (Neurology)  Alycia Rossetti, PharmD (Pharmacist)     Current Outpatient Medications on File Prior to Visit   Medication Sig Dispense Refill    glatiramer 40 mg/mL prefilled syringe Inject 1 mL (40 mg total) into the skin three times a week 12 mL 5    fluticasone (FLONASE) 50 MCG/ACT nasal spray SPRAY 1 SPRAY INTO EACH NOSTRIL ONE TIME DAILY 16 mL 1    famotidine (PEPCID) 20 mg tablet Take 1 tablet (20 mg total) by mouth 2 times daily 60 tablet 5    amantadine (SYMMETREL) 100 mg tablet Take 2 tablets (200 mg total) by mouth daily  for Tiredness associated with Multiple Sclerosis 60 tablet 5    MYRBETRIQ 50 MG 24 hr tablet TAKE 1 TABLET BY MOUTH EVERY DAY 90 tablet 3    DULoxetine (CYMBALTA) 60 mg DR capsule Take 1 capsule (60 mg total) by mouth daily 90 capsule 3    cetirizine (ZYRTEC) 10 mg tablet Take 1 tablet (10 mg total) by mouth daily 90 tablet 1    buPROPion (WELLBUTRIN XL) 300 mg 24 hr tablet TAKE 1 TABLET BY MOUTH EVERY MORNING SWALLOW WHOLE, DO NOT BREAK, CRUSH OR CHEW 90 tablet 3    DULoxetine (CYMBALTA) 30 mg DR capsule TAKE 1 CAPSULE BY MOUTH EVERY DAY TAKE WITH 60MG DOSE TO TOTAL 90MG DAILY. THIS IS REPLACING ESCITALOPRAM 10MG 90 capsule 3    triamcinolone  (KENALOG) 0.1 % ointment Apply cream/ointment to areas on hands. 30 g 3    promethazine (PHENERGAN) 12.5 mg tablet Take 1-2 tablets (12.5-25 mg total) by mouth 4 times daily as needed for Vomiting 30 tablet 0    ondansetron (ZOFRAN) 4 mg tablet Take 1 tablet (4 mg total) by mouth 3 times daily as needed  for Nausea and Vomiting 90 tablet 1    tiZANidine (ZANAFLEX) 4 mg tablet TAKE 1 TABLET BY MOUTH EVERY 8 HOURS AS NEEDED FOR MUSCLE SPASMS 270 tablet 1    diazePAM (VALIUM) 5 MG tablet Take 1 tab 1 hour prior to procedure, repeat 97mn later if still anxious. MDD 151m2 tablet 0    carbamide peroxide (DEBROX) 6.5 % otic solution Place 5 drops into both ears 2 times daily 15 mL 0     No current facility-administered medications on file prior to visit.     Allergies   Allergen Reactions    No Known Drug Allergy      Created by Conversion - 0;      Patient Active Problem List    Diagnosis Date Noted    Patient has healthcare proxy 10/10/2018  Jodi Butler is the primary.  On file in scanned media.      Neurogenic bladder 07/20/2018     myrbetriq started 07/2018      DJD (degenerative joint disease), lumbar 06/27/2018     L2/3 small L disc protrusion with mild/mod L NF encroachment, L4/5 L disc protrusion with moderate NF encroachment    3/22 CT abdomen shows Mild retrolisthesis of L2 on L3 (with posterior disc herniation/extrusion and mild canal narrowing) and minimal retrolisthesis of L5 on S1. Degenerative disc disease and vacuum disc phenomenon at L2-L3      DJD (degenerative joint disease), cervical 08/18/2016     Mild stenosis, b/l NF narrowing at C6/7, R progressed compared to 2016.   Mod/severe L NF narrowing at C5/6      History of echocardiogram 08/04/2016     08/03/2016, Stress Echo with Dobutamine  Negative dobutamine ECHO for ischemia  No ischemic EKG changes with dobutamine  Normal resting LVEF 60%  Minimal chest discomfort at baseline; no significant change reported with stress.         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 Previously on Sertraline, fluoxetine, paxil.  Starting nuvigil by neuro for fatigue sx, stopped it due to cost.      Multiple sclerosis 03/15/2006     Relapsing remitting multiple sclerosis with ongoing symptoms with fatigue and spasticity. On Copaxone. Previously seen by [facility] (8/12). Seen by Dr. Archie Balboa.   Diagnosed in 2004, had sx of double vision.   Relapse 11/2014 in setting of UTI, rec'd prednisone taper and IV steroid infusion  ?relapse 08/2016 with sx of chest tightness, seen by neuro, did IV steroid infusion  MRI brain and cspine 08/2016 stable  12/19 L spine MRI ordered, amantandine increased to 25m BID  1/20 MRI L spine without new lesions    C/o cognitive changes 2020, getting MRI brain and recommended to have sleep study  6/21- C spine with new lesions, MRI stable  10/21- Plan to switch to Tecfidera  Attempted vumerity but had hypoglycemia and abd pain  3/22- declined ponesimod; returned to glatimar       Past Medical History:   Diagnosis Date    Anxiety 2004    Depression     MS (multiple sclerosis)      Past Surgical History:   Procedure Laterality Date    APPENDECTOMY       Family History   Problem Relation Age of Onset    Multiple Sclerosis Father     Stroke Maternal Grandmother     Alzheimer's disease Maternal Grandmother     Hypertension Mother     Arthritis Mother     Breast cancer Paternal Aunt 425   Breast cancer Paternal Grandmother     Cancer Paternal Grandmother     Diabetes Paternal Grandmother     Lung cancer Paternal Uncle     Anxiety disorder Maternal Aunt     Bleeding problems Maternal Aunt     Blood clots Maternal Aunt     Depression Maternal Aunt     Cancer Paternal Aunt     Cancer Paternal Uncle     Cancer Paternal Grandfather     Multiple Sclerosis Other     Multiple Sclerosis Other      Social History     Socioeconomic History    Marital status: Married      Spouse name: Not on  file    Number of children: Not on file    Years of education: Not on file    Highest education level: Not on file   Occupational History    Not on file   Tobacco Use    Smoking status: Former Smoker     Packs/day: 1.00     Years: 5.00     Pack years: 5.00     Types: Cigarettes     Quit date: 06/07/1996     Years since quitting: 24.3    Smokeless tobacco: Never Used   Substance and Sexual Activity    Alcohol use: Yes     Comment: Only special occasions    Drug use: No    Sexual activity: Yes     Partners: Female     Birth control/protection: None   Social History Narrative    Lives with wife Jodi Butler and 2 dogs. Permanently disabled for multiple sclerosis.        Objective:     Vital Signs: BP 128/68 (BP Location: Left arm, Patient Position: Sitting)    Pulse 83    Ht 1.549 m (5' 0.98")    Wt 72.1 kg (159 lb)    SpO2 99%    BMI 30.06 kg/m    BMI: Body mass index is 30.06 kg/m.    Vision Screening Results (Welcome visit only):  No exam data present    Depression Screening Results:  Recent Review Flowsheet Data     PHQ Scores 01/14/2020 10/12/2019 10/12/2019 04/09/2019 10/10/2018 06/15/2018 01/16/2018    PSQ2 Q1 - Interest/Pleasure N Y Y N N N N    PSQ2 Q2 - Down, Depressed, Hopeless N Y - N N N N    PHQ Q9 - Better Off Dead - 1 - 0 - 0 -    PHQ Calculated Score - 15 - 6 - 6 -        Opioid Use/DAST- 10 Screening Results:   How many times in the past year have you used an illegal drug or used a prescription medication for nonmedical reasons?: 0 (10/13/2020  1:13 PM)    Activities of Daily Living/Functional Screening Results:  Is the person deaf or does he/she have serious difficulty hearing?: N  Is this person blind or does he/she have serious difficulty seeing even when wearing glasses?: N  *Vision Status: Visual aid   Does this person have serious difficulty walking or climbing stairs?: Y  *Walks in Home: Independent  *Climbing Stairs: Needs Assistance  Does this person have difficulty dressing or  bathing?: N  *Shopping: Needs Assistance  *House Keeping: Dependent  *Managing Own Medications: Independent  *Handling Finances: Independent  Difficulty doing errands due to a physicial, mental or emotional condition: No  Difficulty remembering or making decisions due to a physicial, mental or emotional condition: Yes      Fall Risk Screening Results:  Have you fallen in the last year?: No  Do you feel you are at risk for falling?: No  Do you feel your risk of falling is: : Moderate      Assessment and Plan:     Cognitive Function:  Recall of recent and remote events appears:  Abnormal - some difficulty remembernig words (medication names) or asking the same question numerous times, or forgetting how to do things she was used to doing independently.      Advanced Care Planning:  was discussed and the paperwork can be found in the scanned media section  The following health maintenance plan was reviewed with the patient:    Health Maintenance Topics with due status: Overdue       Topic Date Due    Colon Cancer Screening USPSTF Never done    DEPRESSION SCREEN MONTHLY 02/14/2020     Health Maintenance Topics with due status: Postponed       Topic Postponed Until    HIV Screening USPSTF/[place] 01/13/2021 (Originally 01/30/1984)     Health Maintenance Topics with due status: Not Due       Topic Last Completion Date    Cervical Cancer Screening Other 06/15/2018    Breast Cancer Screening Other 11/30/2019     Health Maintenance Topics with due status: Completed       Topic Last Completion Date    Hepatitis C Screening USPSTF/[place] 12/03/2019    IMM-INFLUENZA 05/27/2020    COVID-19 Vaccine 05/27/2020     This health maintenance schedule, identified risks, a list of orders placed today and patient goals have been provided to Jodi Butler in the after visit summary.     Plan for any concerns identified during screening or risk assessments:    Discussed CRC screening options; opts for cologuard    PHQ completed    Johny Drilling,  MD  [facility]  10/13/2020  2:24 PM

## 2020-10-14 ENCOUNTER — Telehealth: Payer: Self-pay

## 2020-10-14 NOTE — Telephone Encounter (Signed)
Order Specific Questions   Question Answer Comment   What is your clinical reason for the referral? multiple sclerosis, increasing depression from progression of illness, interested in therapy    Referral Information   Patient: CAYLYN, TEDESCHI [[id number]] Referral #: [id number]     <="">   Status: Authorized Type: Referral   Class: Internal Reason(s): Consult and treat [9]   Diagnosis: F32.A (ICD-10-CM) - Depression, unspecified depression type Procedure: KGU542 - AMB REFERRAL TO PSYCHIATRY   Start: 10/13/2020 Expiration:    Requested: 1 Authorized: 1   Scheduled:  Completed:    Referring Location: [place] [facility] [place] Referred to Location:    Referring Department: [facility] Referred To Department: [facility] ADULT GENERAL     Referred to Dept Phone Number: [phone]   Referring Provider: [facility] - Ambulatory Telephone Intake Screen     Patient Information:    Patient's Name: ARANTXA PIERCEY    Patient's Date of Birth: [birth year]    Patient's Medical Record Number: [id number]    Preferred Phone: [phone]    Verified? yes    Can a message be left? yes    Address: [address]  [place] [address] [facility]    Verified? yes      Insurance Information:    Type: Medicaid and Medicare        Referral Source:    internal      Presenting Concern:     What services are you hoping to be connected to? Therapy    What is the reason you are seeking care?      "I'm struggling with a few issues right now that I need help with. Some have been going on for a long time, some are more new."    Pt did not wish to go into detail but did endorse some feelings of depression, has never been in therapy before.                                     Mental Health History:    Are you currently involved in mental health treatment? No    Are you currently taking a long acting injectable medication for mental health reasons? No      Customer Service:    Do you need arrangements  for?    Wheelchair: No    Interpreter Services:No     Are you able to attend appointments virtually/via Zoom? Yes          12/17/2020 10:30 AM Bland Span, LCSW [facility] ADULT   Arrive at: Video Visit with Patient

## 2020-10-17 ENCOUNTER — Other Ambulatory Visit: Payer: Self-pay

## 2020-10-17 DIAGNOSIS — Z9109 Other allergy status, other than to drugs and biological substances: Secondary | ICD-10-CM

## 2020-10-17 MED ORDER — CETIRIZINE HCL 10 MG PO TABS *I*
10.0000 mg | ORAL_TABLET | Freq: Every day | ORAL | 1 refills | Status: DC
Start: 2020-10-17 — End: 2021-01-06

## 2020-10-17 NOTE — Telephone Encounter (Signed)
Last office visit:   10/13/2020  Patients upcoming appointments:  Future Appointments   Date Time Provider Department Center   12/12/2020  2:00 PM Moore, Jillian, MD CAF None   12/17/2020 10:30 AM Wilder, Peter, LCSW CBA None   01/22/2021 12:00 PM Bellizzi, Matthew, MD IMM None   10/16/2021  2:00 PM Moore, Jillian, MD CAF None       Recent Lab Values 02/17/2018   EXP DATE 04/06/18       Recent Lab results:  GENERAL CHEMISTRY   Recent Labs     08/08/20  1635 08/06/20  1412 08/04/20  1047 12/03/19  1743   NA 142  --   --  140   K 3.6  --   --  3.9   CL 103  --   --  102   CO2 27  --   --  24   GAP 12  --   --  14   UN 7  --   --  10   CREAT 0.70 0.61  --  0.82   GFRC  --   --   --  85   GFRB  --   --   --  98   GLU 101*  --  74 109*   CA 9.4  --   --  9.8      LIPID PROFILE   No value within the past 365 days   LIVER PROFILE   Recent Labs     08/08/20  1635 08/06/20  1412 08/04/20  1047   ALT 38* 35 31   AST 34 26 30   ALK 75 79 84   TB 0.4 0.8 0.3      DIABETES THYROID   No value within the past 365 days Recent Labs     08/04/20  1047   TSH 1.33         Pending/Orders Labs:  Lab Frequency Next Occurrence   MR head without and with contrast Once 08/25/2020   Lab Only EKG Once 08/20/2020   MR cervical spine without and with contrast Once 01/14/2021   CBC and differential Every 3 months + PRN    Hepatic function panel Every 3 months + PRN

## 2020-10-22 ENCOUNTER — Other Ambulatory Visit: Payer: Self-pay | Admitting: Family Medicine

## 2020-10-22 NOTE — Telephone Encounter (Signed)
Last office visit:   10/13/2020  Patients upcoming appointments:  Future Appointments   Date Time Provider [place]   12/12/2020  2:00 PM Johny Drilling, MD CAF None   12/17/2020 10:30 AM Lavena Stanford, LCSW CBA None   01/22/2021 12:00 PM Charlann Lange, MD IMM None   10/16/2021  2:00 PM Johny Drilling, MD CAF None       Recent Lab Values 02/17/2018   EXP DATE 04/06/18       Recent Lab results:  GENERAL CHEMISTRY   Recent Labs     08/08/20  1635 08/06/20  1412 08/04/20  1047 12/03/19  1743   NA 142  --   --  140   K 3.6  --   --  3.9   CL 103  --   --  102   CO2 27  --   --  24   GAP 12  --   --  14   UN 7  --   --  10   CREAT 0.70 0.61  --  0.82   GFRC  --   --   --  85   GFRB  --   --   --  98   GLU 101*  --  74 109*   CA 9.4  --   --  9.8      LIPID PROFILE   No value within the past 365 days   LIVER PROFILE   Recent Labs     08/08/20  1635 08/06/20  1412 08/04/20  1047   ALT 38* 35 31   AST 34 26 30   ALK 75 79 84   TB 0.4 0.8 0.3      DIABETES THYROID   No value within the past 365 days Recent Labs     08/04/20  1047   TSH 1.33         Pending/Orders Labs:  Lab Frequency Next Occurrence   MR head without and with contrast Once 08/25/2020   Lab Only EKG Once 08/20/2020   MR cervical spine without and with contrast Once 01/14/2021   CBC and differential Every 3 months + PRN    Hepatic function panel Every 3 months + PRN

## 2020-10-30 ENCOUNTER — Other Ambulatory Visit: Payer: Self-pay | Admitting: Primary Care

## 2020-10-30 DIAGNOSIS — F32A Depression, unspecified: Secondary | ICD-10-CM

## 2020-10-30 NOTE — Telephone Encounter (Signed)
Last office visit:   10/13/2020  Patients upcoming appointments:  Future Appointments   Date Time Provider Department Center   12/12/2020  2:00 PM Moore, Jillian, MD CAF None   12/17/2020 10:30 AM Wilder, Peter, LCSW CBA None   01/22/2021 12:00 PM Bellizzi, Matthew, MD IMM None   10/16/2021  2:00 PM Moore, Jillian, MD CAF None       Recent Lab Values 02/17/2018   EXP DATE 04/06/18       Recent Lab results:  GENERAL CHEMISTRY   Recent Labs     08/08/20  1635 08/06/20  1412 08/04/20  1047 12/03/19  1743   NA 142  --   --  140   K 3.6  --   --  3.9   CL 103  --   --  102   CO2 27  --   --  24   GAP 12  --   --  14   UN 7  --   --  10   CREAT 0.70 0.61  --  0.82   GFRC  --   --   --  85   GFRB  --   --   --  98   GLU 101*  --  74 109*   CA 9.4  --   --  9.8      LIPID PROFILE   No value within the past 365 days   LIVER PROFILE   Recent Labs     08/08/20  1635 08/06/20  1412 08/04/20  1047   ALT 38* 35 31   AST 34 26 30   ALK 75 79 84   TB 0.4 0.8 0.3      DIABETES THYROID   No value within the past 365 days Recent Labs     08/04/20  1047   TSH 1.33         Pending/Orders Labs:  Lab Frequency Next Occurrence   MR head without and with contrast Once 08/25/2020   Lab Only EKG Once 08/20/2020   MR cervical spine without and with contrast Once 01/14/2021   CBC and differential Every 3 months + PRN    Hepatic function panel Every 3 months + PRN

## 2020-11-07 ENCOUNTER — Other Ambulatory Visit: Payer: Self-pay

## 2020-11-10 ENCOUNTER — Other Ambulatory Visit: Payer: Self-pay | Admitting: Neurology

## 2020-11-10 ENCOUNTER — Other Ambulatory Visit: Payer: Self-pay

## 2020-11-10 DIAGNOSIS — G35 Multiple sclerosis: Secondary | ICD-10-CM

## 2020-11-10 MED ORDER — ONDANSETRON HCL 4 MG PO TABS *I*
4.0000 mg | ORAL_TABLET | Freq: Three times a day (TID) | ORAL | 1 refills | Status: DC | PRN
Start: 2020-11-10 — End: 2021-01-02

## 2020-11-10 NOTE — Telephone Encounter (Signed)
Date of last visit and plan for FUV: 09-25-20 - 18 weeks  Date of FUV? 01-22-21  Date of last MRI and plan for next: 11-23-19  Is MRI ordered? Scheduled 01-14-21  DMT (including alternative dosing if applicable): glatiramer     Labs:      Lab results: 08/08/20  1635 08/04/20  1047 12/03/19  1743   Sodium 142  --  140   Potassium 3.6  --  3.9   Chloride 103  --  102   CO2 27  --  24   UN 7  --  10   Creatinine 0.70   < > 0.82   GFR,Caucasian  --   --  85   GFR,Black  --   --  98   Glucose 101*   < > 109*   Calcium 9.4  --  9.8    < > = values in this interval not displayed.           Lab results: 08/08/20  1635 08/06/20  1412   Total Protein 6.6 6.6   Albumin 4.4 4.4   ALT 38* 35   AST 34 26   Alk Phos 75 79   Bilirubin,Total 0.4 0.8   Bilirubin,Direct  --  <0.2           Lab results: 08/08/20  1635   WBC 4.6   Hemoglobin 13.3   Hematocrit 41   RBC 4.4   Platelets 204   Neut # K/uL 2.8   Lymph # K/uL 1.3   Mono # K/uL 0.5   Eos # K/uL 0.0   Baso # K/uL 0.0   Seg Neut % 59.9   Lymphocyte % 28.1   Monocyte % 10.6   Eosinophil % 0.4   Basophil % 0.6

## 2020-11-11 ENCOUNTER — Other Ambulatory Visit: Payer: Self-pay

## 2020-11-13 ENCOUNTER — Other Ambulatory Visit: Payer: Self-pay

## 2020-11-19 ENCOUNTER — Other Ambulatory Visit: Payer: Self-pay | Admitting: Primary Care

## 2020-11-19 MED ORDER — FAMOTIDINE 20 MG PO TABS *I*
20.0000 mg | ORAL_TABLET | Freq: Two times a day (BID) | ORAL | 5 refills | Status: DC
Start: 2020-11-19 — End: 2021-04-23

## 2020-11-19 NOTE — Telephone Encounter (Signed)
Last office visit:   10/13/2020  Patients upcoming appointments:  Future Appointments   Date Time Provider [place]   12/12/2020  2:00 PM Johny Drilling, MD CAF None   12/17/2020 10:30 AM Lavena Stanford, LCSW CBA None   01/14/2021  9:50 AM RIS, CCD U MR2 (MR2) [facility]   01/14/2021 10:50 AM RIS, CCD U MR2 (MR2) [facility]   01/22/2021 12:00 PM Charlann Lange, MD IMM None   10/16/2021  2:00 PM Johny Drilling, MD CAF None       Recent Lab Values 02/17/2018   EXP DATE 04/06/18       Recent Lab results:  GENERAL CHEMISTRY   Recent Labs     08/08/20  1635 08/06/20  1412 08/04/20  1047 12/03/19  1743   NA 142  --   --  140   K 3.6  --   --  3.9   CL 103  --   --  102   CO2 27  --   --  24   GAP 12  --   --  14   UN 7  --   --  10   CREAT 0.70 0.61  --  0.82   GFRC  --   --   --  85   GFRB  --   --   --  98   GLU 101*  --  74 109*   CA 9.4  --   --  9.8      LIPID PROFILE   No value within the past 365 days   LIVER PROFILE   Recent Labs     08/08/20  1635 08/06/20  1412 08/04/20  1047   ALT 38* 35 31   AST 34 26 30   ALK 75 79 84   TB 0.4 0.8 0.3      DIABETES THYROID   No value within the past 365 days Recent Labs     08/04/20  1047   TSH 1.33         Pending/Orders Labs:  Lab Frequency Next Occurrence   MR head without and with contrast Once 08/25/2020   Lab Only EKG Once 08/20/2020   MR cervical spine without and with contrast Once 01/14/2021   CBC and differential Every 3 months + PRN    Hepatic function panel Every 3 months + PRN

## 2020-11-27 ENCOUNTER — Other Ambulatory Visit: Payer: Self-pay | Admitting: Family Medicine

## 2020-11-27 DIAGNOSIS — F32A Depression, unspecified: Secondary | ICD-10-CM

## 2020-11-27 NOTE — Telephone Encounter (Signed)
Last office visit:   10/13/2020  Patients upcoming appointments:  Future Appointments   Date Time Provider [place]   12/12/2020  2:00 PM Johny Drilling, MD CAF None   12/17/2020 10:30 AM Lavena Stanford, LCSW CBA None   01/14/2021  9:50 AM RIS, CCD U MR2 (MR2) [facility]   01/14/2021 10:50 AM RIS, CCD U MR2 (MR2) [facility]   01/22/2021 12:00 PM Charlann Lange, MD IMM None   10/16/2021  2:00 PM Johny Drilling, MD CAF None       Recent Lab Values 02/17/2018   EXP DATE 04/06/18       Recent Lab results:  GENERAL CHEMISTRY   Recent Labs     08/08/20  1635 08/06/20  1412 08/04/20  1047 12/03/19  1743   NA 142  --   --  140   K 3.6  --   --  3.9   CL 103  --   --  102   CO2 27  --   --  24   GAP 12  --   --  14   UN 7  --   --  10   CREAT 0.70 0.61  --  0.82   GFRC  --   --   --  85   GFRB  --   --   --  98   GLU 101*  --  74 109*   CA 9.4  --   --  9.8      LIPID PROFILE   No value within the past 365 days   LIVER PROFILE   Recent Labs     08/08/20  1635 08/06/20  1412 08/04/20  1047   ALT 38* 35 31   AST 34 26 30   ALK 75 79 84   TB 0.4 0.8 0.3      DIABETES THYROID   No value within the past 365 days Recent Labs     08/04/20  1047   TSH 1.33         Pending/Orders Labs:  Lab Frequency Next Occurrence   MR head without and with contrast Once 08/25/2020   Lab Only EKG Once 08/20/2020   MR cervical spine without and with contrast Once 01/14/2021   CBC and differential Every 3 months + PRN    Hepatic function panel Every 3 months + PRN

## 2020-12-02 ENCOUNTER — Other Ambulatory Visit: Payer: Self-pay | Admitting: Primary Care

## 2020-12-02 DIAGNOSIS — R1013 Epigastric pain: Secondary | ICD-10-CM

## 2020-12-02 NOTE — Telephone Encounter (Signed)
Last office visit:   10/13/2020  Patients upcoming appointments:  Future Appointments   Date Time Provider [place]   12/12/2020  2:00 PM Johny Drilling, MD CAF None   12/17/2020 10:30 AM Lavena Stanford, LCSW CBA None   01/14/2021  9:50 AM RIS, CCD U MR2 (MR2) [facility]   01/14/2021 10:50 AM RIS, CCD U MR2 (MR2) [facility]   01/22/2021 12:00 PM Charlann Lange, MD IMM None   10/16/2021  2:00 PM Johny Drilling, MD CAF None       Recent Lab Values 02/17/2018   EXP DATE 04/06/18       Recent Lab results:  GENERAL CHEMISTRY   Recent Labs     08/08/20  1635 08/06/20  1412 08/04/20  1047   NA 142  --   --    K 3.6  --   --    CL 103  --   --    CO2 27  --   --    GAP 12  --   --    UN 7  --   --    CREAT 0.70 0.61  --    GLU 101*  --  74   CA 9.4  --   --       LIPID PROFILE   No value within the past 365 days   LIVER PROFILE   Recent Labs     08/08/20  1635 08/06/20  1412 08/04/20  1047   ALT 38* 35 31   AST 34 26 30   ALK 75 79 84   TB 0.4 0.8 0.3      DIABETES THYROID   No value within the past 365 days Recent Labs     08/04/20  1047   TSH 1.33         Pending/Orders Labs:  Lab Frequency Next Occurrence   MR head without and with contrast Once 08/25/2020   Lab Only EKG Once 08/20/2020   MR cervical spine without and with contrast Once 01/14/2021   CBC and differential Every 3 months + PRN    Hepatic function panel Every 3 months + PRN

## 2020-12-09 ENCOUNTER — Other Ambulatory Visit: Payer: Self-pay | Admitting: Family Medicine

## 2020-12-10 ENCOUNTER — Other Ambulatory Visit: Payer: Self-pay

## 2020-12-12 ENCOUNTER — Encounter: Payer: Self-pay | Admitting: Primary Care

## 2020-12-12 ENCOUNTER — Ambulatory Visit: Payer: Medicare (Managed Care) | Admitting: Primary Care

## 2020-12-12 ENCOUNTER — Other Ambulatory Visit: Payer: Self-pay

## 2020-12-12 VITALS — BP 138/88 | HR 80 | Ht 60.98 in | Wt 160.8 lb

## 2020-12-12 DIAGNOSIS — F32A Depression, unspecified: Secondary | ICD-10-CM

## 2020-12-12 DIAGNOSIS — F419 Anxiety disorder, unspecified: Secondary | ICD-10-CM

## 2020-12-12 NOTE — Progress Notes (Signed)
[facility]     SUBJECTIVE    Pt is here to discuss:    Chief Complaint   Patient presents with    Depression         1. Depression -interval improvement since our last visit when she was switched from Cymbalta to Effexor.  Patient and wife agree that she is having less depression, feels more upbeat.  They continue to notice that she remains irritable, and give example that she was recently quite angry after wife gave somebody a bracelet and she felt excluded.  Patient will describe that she will often yell/argue "without a filter", and then feels badly after.  She also feels that she can move past it after she apologizes, but notices her wife continues to bring it up.    They again mention that her mother contributes to her stress.            PMH / Family Hx / Social Hx  Patient's medications, allergies, problem list, past medical, social histories were reviewed and notable for:      Current Outpatient Medications   Medication Sig    venlafaxine (EFFEXOR-XR) 75 mg 24 hr capsule TAKE 1 CAPSULE BY MOUTH EVERY MORNING WITH 150MG  TABLET FOR A TOTAL DOSE OF 225MG  EVERY DAY SWALLOW WHOLE, DO NOT BREAK, CRUSH OR CHEW    venlafaxine (EFFEXOR-XR) 150 mg 24 hr capsule TAKE 1 CAPSULE BY MOUTH EVERY MORNING WITH WITH 75MG  DOSE FOR A TOTAL OF 225MG  DAILY SWALLOW WHOLE, DO NOT BREAK, CRUSH OR CHEW    famotidine (PEPCID) 20 mg tablet Take 1 tablet (20 mg total) by mouth 2 times daily    fluticasone (FLONASE) 50 MCG/ACT nasal spray USE 1 SPRAY IN EACH NOSTRIL ONE TIME A DAY    cetirizine (ZYRTEC) 10 mg tablet Take 1 tablet (10 mg total) by mouth daily    glatiramer 40 mg/mL prefilled syringe Inject 1 mL (40 mg total) into the skin three times a week    amantadine (SYMMETREL) 100 mg tablet Take 2 tablets (200 mg total) by mouth daily  for Tiredness associated with Multiple Sclerosis    MYRBETRIQ 50 MG 24 hr tablet TAKE 1 TABLET BY MOUTH EVERY DAY    buPROPion (WELLBUTRIN XL) 300 mg 24 hr tablet TAKE 1 TABLET  BY MOUTH EVERY MORNING SWALLOW WHOLE, DO NOT BREAK, CRUSH OR CHEW    carbamide peroxide (DEBROX) 6.5 % otic solution Place 5 drops into both ears 2 times daily    tiZANidine (ZANAFLEX) 4 mg tablet TAKE 1 TABLET BY MOUTH EVERY 8 HOURS AS NEEDED FOR MUSCLE SPASMS    promethazine (PHENERGAN) 12.5 mg tablet TAKE 1 TO 2 TABLETS BY MOUTH FOUR TIMES A DAY AS NEEDED FOR VOMITING    ondansetron (ZOFRAN) 4 mg tablet Take 1 tablet (4 mg total) by mouth 3 times daily as needed  for Nausea and Vomiting    diazePAM (VALIUM) 5 MG tablet Take 1 tab 1 hour prior to procedure, repeat later if still anxious. MDD 10mg     triamcinolone (KENALOG) 0.1 % ointment Apply cream/ointment to areas on hands.          OBJECTIVE  Vitals:    12/12/20 1410   BP: 138/88   BP Location: Left arm   Patient Position: Sitting   Pulse: 80   SpO2: 99%   Weight: 72.9 kg (160 lb 12.8 oz)   Height: 1.549 m (5' 0.98")     Body mass index is 30.4 kg/m.  General: well-appearing Caucasian female, pleasant & conversant, in NAD     Psych: AAOx3, normal affect and mood.  Turns to wife to answer questions most of the time.  Insight and judgement intact.     Recent Review Flowsheet Data     PHQ Scores 12/12/2020 10/13/2020 01/14/2020 10/12/2019 10/12/2019 04/09/2019 10/10/2018    PSQ2 Q1 - Interest/Pleasure - - N Y Y N N    PSQ2 Q2 - Down, Depressed, Hopeless - - N Y - N N    PHQ Q9 - Better Off Dead 0 0 - 1 - 0 -    PHQ Calculated Score 4 7 - 15 - 6 -        GAD-7 Dates 12/12/2020 01/14/2020 04/09/2019   Total Score 3 4 6            ASSESSMENT & PLAN  1. Anxiety and depression  Interval improvement in depressive symptoms as explained at her last visit.  Continues to have some irritability however.  Validated that this may be a sign of frustration with her chronic illness, and is commonly communicated or taken out on family members and/or spouses.  We validated that chronic illness can affect her sense of independence and recommended next step to focus on counseling  regarding this.  Also encouraged consideration of marital counseling, to help with communication skills/style      Follow-up: 4 to 6 months      , MD  [facility]  12/12/2020  2:12 PM        ______________________

## 2020-12-16 ENCOUNTER — Other Ambulatory Visit: Payer: Self-pay

## 2020-12-17 ENCOUNTER — Ambulatory Visit: Payer: Medicare (Managed Care) | Attending: Psychiatry

## 2020-12-17 ENCOUNTER — Other Ambulatory Visit: Payer: Self-pay

## 2020-12-17 DIAGNOSIS — G35 Multiple sclerosis: Secondary | ICD-10-CM | POA: Insufficient documentation

## 2020-12-17 DIAGNOSIS — F32A Depression, unspecified: Secondary | ICD-10-CM | POA: Insufficient documentation

## 2020-12-17 NOTE — BH Intake Assessment (Signed)
[facility] Initial Diagnostic Assessment   Patient encounter was performed via Video     Patient located at: home    Provider located at: clinical office    Other participants in this encounter and roles:  None    Consent was previously obtained from the patient to complete this service via telehealth; including the potential for financial liability.  Length of session: 50 minutes    Service Location:  [facility] @ [facility]    Referral Source:  Referral Source: PCP  Collateral Contacts: e-record    Chief Complaint:   "Just having some issues that are...upset, kind of pull a lot of things in, just having a hard time finding myself, Jodi Butler been disabled for a while and haven't worked for a while and it kinda bothers me"    HPI:  Recent events/precipitants include: Session conducted via telehealth due to covid-19 pandemic.   Pt is a 50 yr old female who is referred to the service by her PCP for concerns about anxiety and depression. Pt reports no PPHX. Pt reports she is prescribed Wellbutrin and Effexor by her PCP. Pt reports she has been on Wellbutrin since 2012. Pt reports she has been on something since being diagnosed with multiple sclerosis.Pt reports she is disabled with multiple sclerosis and her wife has POTS. Pt reports she has had multiple sclerosis since 2004. Pt reports she was diagnosed in 2004 but had episodes prior to that. Pt identifies struggling with depression more than anxiety. Pt identifies what she grapples with caused by her multiple sclerosis.    Pt describes her depression as "I'd say to the point where my body is sore and I don't want to move. I just sit and I watch tv. I don't want to go anywhere or do anything". Pt reports she is weepy at times and experiences sadness. "Sometimes I feel I'm guilted into choices, that's by my mother. I don't live by what I want to do, I'm guilted into doing what I'm supposed to do. My sister identified this too me". Pt  reports she gets 8-9 hrs of sleep. Pt reports she and her partner get along well but Pt recognizes she says tings she probably shouldn't at times and this causes conflict. Pt reports her ability to walk distances is being impacted. She uses a scooter at times.     Pt reports she and her wife lived in [facility] [place] for a time but moved back due to financial issues and she missed he mother. Pt reports her partners parents were there. Pt reports they lived there from 2012 to 2014. Pt reports she would like to live someplace warmer.  Pt reports she enjoys sitting outside in the yard, enjoying the sun, watching the dogs run around.  When asked what she wants to address in therapy Pt responds, "Learning how to cope without outside influences. Learning how to control my outbursts. Being tactful about how I say things. Problem solving without being to blunt about things, being able to get my point across and not feeling guilty about the decisions I do make".   Pt denies use of substances. Pt does not present with evidence of paranoia or psychosis. Pt was organized, logical and linear through the session.                 Patient's stressors include: Pt reports she would like to get out of [place], weather, her health, her partners health.     Patient Behavioral  Health History:  Currently Receiving Mental Health Treatment:  None Reported.    Currently Taking Psychotropic Medications (current/in the past year): Yes.  Specify (Include details relevant to what has been or has not been helpful with this treatment): Wellbutrin and Effexor. Pt reports past trials of Zoloft.    Does individual report problems (current) with any of the following?   None reported Pt reports she vapes cannabis oil 1x/week for pain.     Currently Engaged in Treatment for Substance Use/Abuse: None Reported.  Currently Taking Medications to Treat Addiction (in the past year): None Reported.    FAMILY/SOCIAL HISTORY:  Pt reports she was born in [place] and was  raised by her natural parents. Pt reports she has one older sister. Pt reports her sister is nearby and they have a good relationship. Pt describes her childhood as "good, supportive family".     Family Composition:  Pt reports she lives with her spouse [name]. Pt reports they have been together since 1995, married in 2009. Pt reports they have 2 dogs.     Education:  Pt reports she completed an associates degree in business. Pt reports she last worked in August of 2009. 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 Pt reports she is disabled by multiple sclerosis.     Employment:  Social History     Socioeconomic History    Marital status: Married     Spouse name: Not on file    Number of children: Not on file    Years of education: Not on file    Highest education level: Not on file   Occupational History    Not on file   Tobacco Use    Smoking status: Former Smoker     Packs/day: 1.00     Years: 5.00     Pack years: 5.00     Types: Cigarettes     Quit date: 06/07/1996     Years since quitting: 24.5    Smokeless tobacco: Never Used   Substance and Sexual Activity    Alcohol use: Yes     Comment: Only special occasions    Drug use: No    Sexual activity: Yes     Partners: Female     Birth control/protection: None   Social History Narrative    Lives with wife [name] and 2 dogs. Permanently disabled for multiple sclerosis.      Trauma:  Pt denies.     Past Medical History:   Diagnosis Date    Anxiety 2004    Depression     MS (multiple sclerosis)      Past Surgical History:   Procedure Laterality Date    APPENDECTOMY           Domestic Violence:  Patient and/or identification tool has not identified the presence of domestic violence at this time.    Generalized Anxiety Disorder Scale (GAD-7)  Patient present and available to complete screening(s).  GAD-7 Dates 12/17/2020   Total Score 8     PHQ-9  6    Mental Status Exam:  APPEARANCE: Appears stated age, Well-groomed, Casual  ATTITUDE TOWARD  INTERVIEWER: Cooperative  MOTOR ACTIVITY: WNL (within normal limits)  EYE CONTACT: Direct  SPEECH: Normal rate and tone  AFFECT: Full Range  MOOD: Euthymic  THOUGHT PROCESS: Normal  THOUGHT CONTENT: No unusual themes  PERCEPTION: Within normal limits and No evidence of hallucinations  CURRENT SUICIDAL IDEATION: patient denies  CURRENT HOMICIDAL IDEATION: Patient denies  ORIENTATION: Alert and Oriented X 3.  CONCENTRATION: WNL  MEMORY:   Recent: impaired Pt identifies this as part of her multiple sclerosis.   Remote: impaired Being impacted by her multiple sclerosis.  COGNITIVE FUNCTION: Average intelligence  JUDGMENT: Intact  IMPULSE CONTROL: Pt reports she says things sometimes before thinking about it.  INSIGHT: Fair    Assessment of Risk For Suicidal Behavior:  The items prior to Risk Formulation and Summary in this assessment can guide the collection of relevant risk-related information. These data inform the Risk Formulation and Summary, which is the primary focus of this assessment. Be sure to document the rationale (reasoning) behind your clinical judgment of risk.     [place] Scale administered? N/A    Predisposing Vulnerabilities:  Chronic physical pain or functional impairment due to illness, Avoidant/Isolated traits, Additional details or comments: Lost an Aunt and father in law in the last year.     Non-Suicidal Self-Injury:  None reported by patient.     Recent Stressful Life Event(s):  Additional details or comments: Wife is also disabled, caring for her mother in law after her husband passed. Her own mother is declings. Not able to help as much as she would like.      Clinical Presentation:  Impulsivity, recklessness (incl. subjective feeling "out of control"), Social withdrawal, Feeling trapped, Irritability/anger/agitation     Access to Lethal Means (weapons/firearms, medications, other):  No     Opportunities for Crisis and Treatment Planning/Protective Factors:  Able to identify reasons for  living, Does not view suicide as a personal option, Religious/Spiritual belief system, Hopefulness, Perceived reasons to live are greater than reasons to die, Supportive relationships, Lives with a partner or other family, Future oriented, Additional details or comments: Pt reports she grapples with her purpose in life.      Engagement and Reliability:  Engagement with attempts to interview/help: fair  Assessment of reliability of report: fair  Additional details or comments: NA    Suicide Risk Formulation and Summary:   Synthesize information gathered into an overall judgment of risk.     Overall Clinical Judgment of Risk: (Indicate your judgment of this individual's long and short term risk)   - Long-term./Chronic Risk: Low   - Short-term/Acute Risk: Low     Synthesis and Rationale for Clinical Judgment of Risk: Describe: Pt denies history of self harm or suicide attempts. Pt cites her wife and family as protective factors. Pt's short and long term risk for self harm or suicide appears low.       - Plan: Monitoring beyond usual for suicide risk not indicated at this time.     Assessment of Risk For Violent Behavior:    Current violence ideation: No  Current violence intent: No  Current violence plan: No  Recent (within past 8 weeks) violent or threatening thoughts or behaviors: No  Prior history of any violent or threatening behavior toward others: No  Prior legal involvement (family, civil, or criminal) related to threatening or violent behavior: No  Current involvement in a protection order proceeding: No  History of destruction to property: No; If yes, most recent date: NA    Violence Risk Formulation and Summary:  Synthesize information gathered into an overall judgment of risk.    Overall Clinical Judgment of Risk (indicate your judgment of this individual's long and short-term risk):    - Long-term./Chronic Risk: Low   - Short-term/Acute Risk: Low    Synthesis and Rationale  for Clinical Judgment of Risk:  Describe: Pt denies history of violence. Pt's short and long term risks for violence appear low.      - Plan: Monitoring beyond usual for violence risk not indicated at this time.      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 Pt reports no formal PPHX. Pt reports she is prescribed Wellbutrin and Effexor by her PCP for anxiety and depression. Pt reports she is diagnosed with multiple sclerosis since 2004 and grapples with the impact of the disease upon her life. Pt reports her wife is also disabled. Pt reports symptoms synonymous with anxiety and depression. Pt articulates her depression being her primary concern at this time. Pt scored an 8 on the GAD-7 and a 6 on the PHQ-9. Writer feels depression due to multiple sclerosis an appropriate diagnosis. Pt appears to be seeking skills and ways to manage her depression in the face of her disease. Pr denies use of substances. Pt does not present with evidence of paranoia or psychosis. Pt was calm,alert and in good control at the close of the session.   .      Plan:  Patient will be admitted for further mental health treatment in the [facility] .  Treatment recommendations include:  individual psychotherapy (CBT, PST, DBT, CPT, IPT).     NEXT APPT: Barbaraann Faster @1 :00 on 7.27.22      04-18-2002, LCSW   Consent was previously obtained from the patient to complete this Video consult; including the potential for financial liability.    Time spent on the  Video with patient: 40+ minutes    Length of time compiling the report:30 minutes

## 2020-12-19 ENCOUNTER — Other Ambulatory Visit: Payer: Self-pay

## 2020-12-19 DIAGNOSIS — F32A Depression, unspecified: Secondary | ICD-10-CM

## 2020-12-19 NOTE — Telephone Encounter (Signed)
Last office visit:   12/12/2020  Patients upcoming appointments:  Future Appointments   Date Time Provider [place]   12/31/2020  1:00 PM Dahlia Client R [facility] None   01/14/2021  9:50 AM RIS, CCD U MR2 (MR2) [facility]   01/14/2021 10:50 AM RIS, CCD U MR2 (MR2) [facility]   01/22/2021 12:00 PM Charlann Lange, MD IMM None   04/17/2021  2:20 PM Patriciaann Clan, MD [facility] None   10/16/2021  2:00 PM Johny Drilling, MD [facility] None       Recent Lab Values 02/17/2018   EXP DATE 04/06/18       Recent Lab results:  GENERAL CHEMISTRY   Recent Labs     08/08/20  1635 08/06/20  1412 08/04/20  1047   NA 142  --   --    K 3.6  --   --    CL 103  --   --    CO2 27  --   --    GAP 12  --   --    UN 7  --   --    CREAT 0.70 0.61  --    GLU 101*  --  74   CA 9.4  --   --       LIPID PROFILE   No value within the past 365 days   LIVER PROFILE   Recent Labs     08/08/20  1635 08/06/20  1412 08/04/20  1047   ALT 38* 35 31   AST 34 26 30   ALK 75 79 84   TB 0.4 0.8 0.3      DIABETES THYROID   No value within the past 365 days Recent Labs     08/04/20  1047   TSH 1.33         Pending/Orders Labs:  Lab Frequency Next Occurrence   MR head without and with contrast Once 08/25/2020   Lab Only EKG Once 08/20/2020   MR cervical spine without and with contrast Once 01/14/2021   CBC and differential Every 3 months + PRN    Hepatic function panel Every 3 months + PRN

## 2020-12-24 ENCOUNTER — Encounter: Payer: Self-pay | Admitting: Gastroenterology

## 2020-12-25 ENCOUNTER — Encounter: Payer: Self-pay | Admitting: Gastroenterology

## 2020-12-30 ENCOUNTER — Other Ambulatory Visit: Payer: Self-pay | Admitting: Neurology

## 2020-12-30 DIAGNOSIS — G35 Multiple sclerosis: Secondary | ICD-10-CM

## 2020-12-30 NOTE — Telephone Encounter (Signed)
Date of last visit and plan for FUV: 09-25-20 - 18 weeks  Date of FUV? 01-22-21  Date of last MRI and plan for next: 11-23-19  Is MRI ordered? Scheduled 01-14-21  DMT (including alternative dosing if applicable): glatiramer    Labs:      Lab results: 08/08/20  1635   Sodium 142   Potassium 3.6   Chloride 103   CO2 27   UN 7   Creatinine 0.70   Glucose 101*   Calcium 9.4           Lab results: 08/08/20  1635 08/06/20  1412   Total Protein 6.6 6.6   Albumin 4.4 4.4   ALT 38* 35   AST 34 26   Alk Phos 75 79   Bilirubin,Total 0.4 0.8   Bilirubin,Direct  --  <0.2           Lab results: 08/08/20  1635   WBC 4.6   Hemoglobin 13.3   Hematocrit 41   RBC 4.4   Platelets 204   Neut # K/uL 2.8   Lymph # K/uL 1.3   Mono # K/uL 0.5   Eos # K/uL 0.0   Baso # K/uL 0.0   Seg Neut % 59.9   Lymphocyte % 28.1   Monocyte % 10.6   Eosinophil % 0.4   Basophil % 0.6

## 2020-12-30 NOTE — Telephone Encounter (Signed)
Pt needs a new script

## 2020-12-31 ENCOUNTER — Ambulatory Visit: Payer: Medicare (Managed Care)

## 2020-12-31 DIAGNOSIS — G35 Multiple sclerosis: Secondary | ICD-10-CM

## 2020-12-31 DIAGNOSIS — F32A Depression, unspecified: Secondary | ICD-10-CM

## 2020-12-31 NOTE — Progress Notes (Signed)
Behavioral Health Progress Note     Length of Session: 45 minutes    Contact Type:  Location: Off Site    Direct, Face to Face, Individual Psychotherapy     Patient encounter was performed via Video     Patient located at: home    Provider located at: clinical office    Other participants in this encounter and roles:  None.     Consent was previously obtained from the patient to complete this service via telehealth; including the potential for financial liability.      Problem(s)/Goals Addressed from Treatment Plan:    Problem 1:   Treatment Problem #1 12/31/2020   Patient Identified Problem Depression       Goal for this problem:    Treatment Goal #1 12/31/2020   Patient Identified Goal Better manage symptoms.        Progress towards this goal: N/A - Initial plan      Mental Status Exam:  APPEARANCE: Appears stated age  ATTITUDE TOWARD INTERVIEWER: Cooperative  MOTOR ACTIVITY: DIfficult to assess over zoom.   EYE CONTACT: Direct  SPEECH: Soft  AFFECT: Full Range  MOOD: Neutral  THOUGHT PROCESS: Normal  THOUGHT CONTENT: No unusual themes  PERCEPTION: Within normal limits  CURRENT SUICIDAL IDEATION: patient denies  CURRENT HOMICIDAL IDEATION: Patient denies  ORIENTATION: Alert and Oriented X 3.  CONCENTRATION: WNL  MEMORY:   Recent: impaired Client states that her memory is impaired by MS.    Remote: Client states that her memory is impaired by MS.   COGNITIVE FUNCTION: WNL.   JUDGMENT: Intact  IMPULSE CONTROL: Poor  INSIGHT: Fair    Risk Assessment:  ASSESSMENT OF RISK FOR SUICIDAL BEHAVIOR  Changes in risk for suicide from baseline Formulation of Risk and/or previous intake, including newly identified risk, if any: none  Violence risk was assessed and updated    Session Content:: Client identified that she is trying to find purpose for herself, as she has been out of work for a long time. Client reported that this search for meaning started after she was diagnosed. Client couldn't work anymore, the first couple of  weeks felt like vacation, but years later, not as much. 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 Client described how she enjoyed making coffee, remembering people's orders, and providing a valued service. Client stated that she can't multi task as well now. Client and undersigned discussed values based living, and finding meaning though ways of being. Client and undersigned discussed client's dogs, and how she spends a lot of time with her partner watching movies. Client described how the pain in her legs makes it difficult to get around, and that she is struggling with her short term memory. Client described her experience taking Effexor, client stated that she is good with having her medications prescribed by her primary care physician. Client described how her support system is made up of her partner, sister and parents, who live a couple of houses down. Client stated that it is a bitter sweet challenge having her parents so close, that it is nice to have their support, but hard to watch them get older and not be able to help as much as she wishes she could. Client reported that her and her partner have an aide that helps out on Tuesdays and Thursdays. Client originally tried finding meaning by doing things around the house, projects, fixing  things with youtube. Client identified that her strengths are: being a fixer, finding solutions to problems, learning. Client is working on increasing her focus, as it seems like she is losing her filter, and will say things she regrets. Client and undersigned discussed a values based activity, and trying to implement a few minutes each day thinking about the leaves on a stream exercise.      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    NEXT APPT: 8/18      Kandis Mannan

## 2020-12-31 NOTE — BH Treatment Plan (Signed)
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 She has the support of her partner.     Problem Areas  *At least one problem must be targeted toward risk reduction if Formulation of Risk or any other previous exam indicated special monitoring or intervention for suicide and/or violence risk indicated.    PROBLEM AREAS (choose and describe relevant):  MOOD:Can be depressed  OTHER: MS  ________________________________________________________________  Treatment Problem #1 12/31/2020   Patient Identified Problem Depression        Treatment Goal #1 12/31/2020   Patient Identified Goal Better manage symptoms.        The rationale for addressing this problem is that resolving it will (select all that apply):  Reduce symptoms of disorder, Reduce functional impairment associated with disorder, Decrease likelihood of hospitalization, Facilitate transfer skills learned in therapy to everday life and Is a key motivational factor for the patient's participation in treatment      Progress toward goal(s): N/A - Initial plan    1 a. Measurable Objectives :Dercole identify cognitive distortions and core beliefs that sustain negative perceptions about oneself, others, and the world.Shariwill challenge and re-frame negative self-talk into more positive, realistic self-talk.  Date established: 7.27.22  Target date:7.26.23  Attained or Revised?New    1 b. Measurable Objectives :Shariwill learn, identify, practice 1-3 relaxation, distress tolerance, and emotional  regulation skills during this treatment plan. Will discuss during sessions.  Date established: 7.27.22  Target date:7.26.23  Attained or Revised?New    1 d. Measurable Objectives :Shariwill attend scheduled sessions with providers at [facility] and attend psychopharmacology evaluation if referred.   Date established: 7.27.22  Target date:7.26.23  Attained or Revised?New  ________________________________________________________________      Plan  TREATMENT MODALITIES:  Individual psychotherapy for 45-60 min Q 2-4 weeks with Barbaraann Faster, [facility].  Other: Possible medication evaluation     DISCHARGE CRITERIA for this treatment setting: When treatment plan goals have been completed or Zian is satisfied with her level of functioning.     Clinician's name: Bland Span, LCSW    Psychiatrist's Name: Loreen Freud, MD    Supervisor's Name: Cleone Slim, LCSW-R    Patient/Family Statement  PATIENT/FAMILY STATEMENT:  Obtain patient and family input into the treatment plan, including areas of agreement / disagreement.  Obtain patient's signature - if not possible, briefly describe the reason.     Patient Comments:          I HAVE PARTICIPATED IN THE DEVELOPMENT OF THIS TREATMENT PLAN AND I AGREE WITH ITS CONTENTS:       Patient Signature: _______________________________________________________    Date: ______________________________

## 2021-01-01 ENCOUNTER — Encounter: Payer: Self-pay | Admitting: Gastroenterology

## 2021-01-02 ENCOUNTER — Other Ambulatory Visit: Payer: Self-pay | Admitting: Neurology

## 2021-01-02 DIAGNOSIS — G35 Multiple sclerosis: Secondary | ICD-10-CM

## 2021-01-02 NOTE — Telephone Encounter (Signed)
Elnita Maxwell, [facility], calling in stating they requested a new RX of pt's Ondansetron 4MG  on 12/30/20 and have yet to receive it.    Please advise    01/01/21 can be reached at [phone]

## 2021-01-02 NOTE — Telephone Encounter (Signed)
Date of last visit and plan for FUV: 09-25-20 - 18 weeks  Date of FUV? 01-22-21  Date of last MRI and plan for next: 11-23-19  Is MRI ordered? Scheduled 01-14-21  DMT (including alternative dosing if applicable): glatiramer    Labs:      Lab results: 08/08/20  1635   Sodium 142   Potassium 3.6   Chloride 103   CO2 27   UN 7   Creatinine 0.70   Glucose 101*   Calcium 9.4           Lab results: 08/08/20  1635 08/06/20  1412   Total Protein 6.6 6.6   Albumin 4.4 4.4   ALT 38* 35   AST 34 26   Alk Phos 75 79   Bilirubin,Total 0.4 0.8   Bilirubin,Direct  --  <0.2           Lab results: 08/08/20  1635   WBC 4.6   Hemoglobin 13.3   Hematocrit 41   RBC 4.4   Platelets 204   Neut # K/uL 2.8   Lymph # K/uL 1.3   Mono # K/uL 0.5   Eos # K/uL 0.0   Baso # K/uL 0.0   Seg Neut % 59.9   Lymphocyte % 28.1   Monocyte % 10.6   Eosinophil % 0.4   Basophil % 0.6

## 2021-01-05 ENCOUNTER — Other Ambulatory Visit: Payer: Self-pay | Admitting: Family Medicine

## 2021-01-05 DIAGNOSIS — Z9109 Other allergy status, other than to drugs and biological substances: Secondary | ICD-10-CM

## 2021-01-05 MED ORDER — ONDANSETRON HCL 4 MG PO TABS *I*
4.0000 mg | ORAL_TABLET | Freq: Three times a day (TID) | ORAL | 1 refills | Status: DC | PRN
Start: 2021-01-05 — End: 2021-02-25

## 2021-01-06 ENCOUNTER — Telehealth: Payer: Self-pay | Admitting: Primary Care

## 2021-01-06 NOTE — Telephone Encounter (Signed)
Lawanna Kobus called to report that [name] has been approved for   18 hours a week for home care services but only is requesting 5 hours a week which otal 10 hrs a week. Lawanna Kobus also stated that [name] legs has been swelling and she has been keeping them elevated, stated that pt has leg pain which patient has been taking her pain medication but pt is still having the swelling. Lawanna Kobus is recommending Tubi Grips for patients's leg swelling

## 2021-01-06 NOTE — Telephone Encounter (Signed)
Last office visit:   12/12/2020  Patients upcoming appointments:  Future Appointments   Date Time Provider [place]   01/14/2021  9:50 AM RIS, CCD U MR2 (MR2) [facility]   01/14/2021 10:50 AM RIS, CCD U MR2 (MR2) [facility]   01/22/2021 12:00 PM Charlann Lange, MD [facility] None   01/22/2021  3:00 PM Dahlia Client R [facility] None   04/17/2021  2:20 PM Patriciaann Clan, MD [facility] None   10/16/2021  2:00 PM Johny Drilling, MD [facility] None       Recent Lab Values 02/17/2018   EXP DATE 04/06/18       Recent Lab results:  GENERAL CHEMISTRY   Recent Labs     08/08/20  1635 08/06/20  1412 08/04/20  1047   NA 142  --   --    K 3.6  --   --    CL 103  --   --    CO2 27  --   --    GAP 12  --   --    UN 7  --   --    CREAT 0.70 0.61  --    GLU 101*  --  74   CA 9.4  --   --       LIPID PROFILE   No value within the past 365 days   LIVER PROFILE   Recent Labs     08/08/20  1635 08/06/20  1412 08/04/20  1047   ALT 38* 35 31   AST 34 26 30   ALK 75 79 84   TB 0.4 0.8 0.3      DIABETES THYROID   No value within the past 365 days Recent Labs     08/04/20  1047   TSH 1.33         Pending/Orders Labs:  Lab Frequency Next Occurrence   MR head without and with contrast Once 08/25/2020   Lab Only EKG Once 08/20/2020   MR cervical spine without and with contrast Once 01/14/2021   CBC and differential Every 3 months + PRN    Hepatic function panel Every 3 months + PRN

## 2021-01-07 NOTE — Telephone Encounter (Signed)
Noted  Agree with tubigrips

## 2021-01-12 ENCOUNTER — Other Ambulatory Visit: Payer: Self-pay

## 2021-01-12 ENCOUNTER — Other Ambulatory Visit: Payer: Self-pay | Admitting: Primary Care

## 2021-01-12 DIAGNOSIS — G35 Multiple sclerosis: Secondary | ICD-10-CM

## 2021-01-12 NOTE — Telephone Encounter (Signed)
Confidential Drug Report  Search Terms: Jodi Butler, [birth year]   Search Date: 01/12/2021 17:22:18 PM   Searching on behalf of: [id number] - Jodi Butler   The Drug Utilization Report below displays all of the controlled substance prescriptions, if any, that your patient has filled in the last twelve months. The information displayed on this report is compiled from pharmacy submissions to the Department, and accurately reflects the information as submitted by the pharmacies.  This report was requested by: Jearld Adjutant   Reference #: [id number]   There are no results for the search terms that you entered.             Last office visit:   12/12/2020  Patients upcoming appointments:  Future Appointments   Date Time Provider [place]   01/14/2021  9:50 AM RIS, CCD U MR2 (MR2) [facility]   01/14/2021 10:50 AM RIS, CCD U MR2 (MR2) [facility]   01/22/2021 12:00 PM Charlann Lange, MD [facility] None   01/22/2021  3:00 PM Dahlia Client R [facility] None   04/17/2021  2:20 PM Patriciaann Clan, MD [facility] None   10/16/2021  2:00 PM Jodi Drilling, MD [facility] None       Recent Lab Values 02/17/2018   EXP DATE 04/06/18       Recent Lab results:  GENERAL CHEMISTRY   Recent Labs     08/08/20  1635 08/06/20  1412 08/04/20  1047   NA 142  --   --    K 3.6  --   --    CL 103  --   --    CO2 27  --   --    GAP 12  --   --    UN 7  --   --    CREAT 0.70 0.61  --    GLU 101*  --  74   CA 9.4  --   --       LIPID PROFILE   No value within the past 365 days   LIVER PROFILE   Recent Labs     08/08/20  1635 08/06/20  1412 08/04/20  1047   ALT 38* 35 31   AST 34 26 30   ALK 75 79 84   TB 0.4 0.8 0.3      DIABETES THYROID   No value within the past 365 days Recent Labs     08/04/20  1047   TSH 1.33         Pending/Orders Labs:  Lab Frequency Next Occurrence   MR head without and with contrast Once 08/25/2020   Lab Only EKG Once 08/20/2020   MR cervical spine without and with contrast Once 01/14/2021   CBC and differential Every 3 months + PRN     Hepatic function panel Every 3 months + PRN

## 2021-01-13 ENCOUNTER — Other Ambulatory Visit: Payer: Self-pay

## 2021-01-13 MED ORDER — DIAZEPAM 5 MG PO TABS *I*
ORAL_TABLET | ORAL | 0 refills | Status: DC
Start: 2021-01-13 — End: 2021-01-22

## 2021-01-14 ENCOUNTER — Other Ambulatory Visit: Payer: Self-pay

## 2021-01-14 ENCOUNTER — Ambulatory Visit
Admission: RE | Admit: 2021-01-14 | Discharge: 2021-01-14 | Disposition: A | Discharge status: Home / Self Care | Payer: Medicare (Managed Care) | Source: Ambulatory Visit | Attending: Neurology | Admitting: Neurology

## 2021-01-14 ENCOUNTER — Ambulatory Visit
Admission: RE | Admit: 2021-01-14 | Discharge: 2021-01-14 | Disposition: A | Discharge status: Home / Self Care | Payer: Medicare (Managed Care) | Source: Ambulatory Visit

## 2021-01-14 DIAGNOSIS — G35 Multiple sclerosis: Secondary | ICD-10-CM

## 2021-01-14 DIAGNOSIS — M47812 Spondylosis without myelopathy or radiculopathy, cervical region: Secondary | ICD-10-CM | POA: Insufficient documentation

## 2021-01-14 DIAGNOSIS — M4802 Spinal stenosis, cervical region: Secondary | ICD-10-CM

## 2021-01-14 MED ORDER — GADOTERIDOL 279.3 MG/ML (PROHANCE) IV SOLN *I*
15.0000 mL | Freq: Once | INTRAVENOUS | Status: AC
Start: 2021-01-14 — End: 2021-01-14
  Administered 2021-01-14: 15 mL via INTRAVENOUS

## 2021-01-15 ENCOUNTER — Encounter: Payer: Self-pay | Admitting: Primary Care

## 2021-01-16 NOTE — Telephone Encounter (Signed)
Per  [employer] patient is to young for medicare to pay for a cologuard medicare pays for ages 20 to 32.

## 2021-01-21 ENCOUNTER — Encounter: Payer: Self-pay | Admitting: Neurology

## 2021-01-22 ENCOUNTER — Telehealth: Payer: Self-pay

## 2021-01-22 ENCOUNTER — Encounter: Payer: Self-pay | Admitting: Gastroenterology

## 2021-01-22 ENCOUNTER — Ambulatory Visit: Payer: Medicare (Managed Care) | Attending: Neurology | Admitting: Neurology

## 2021-01-22 ENCOUNTER — Ambulatory Visit: Payer: Medicare (Managed Care) | Attending: Psychiatry

## 2021-01-22 DIAGNOSIS — G35 Multiple sclerosis: Secondary | ICD-10-CM | POA: Insufficient documentation

## 2021-01-22 DIAGNOSIS — F32A Depression, unspecified: Secondary | ICD-10-CM | POA: Insufficient documentation

## 2021-01-22 DIAGNOSIS — M545 Low back pain, unspecified: Secondary | ICD-10-CM | POA: Insufficient documentation

## 2021-01-22 NOTE — Progress Notes (Signed)
Neuroimmunology Telemedicine Visit    Patient Location: Home  Location of Telemedicine Provider: hospital / clinical location  Other participants in telemedicine encounter and roles:  [name] (wife)  This is an established patient visit.  Reason for visit: Multiple Sclerosis    HPI  I saw Jodi Butler in follow-up for her multiple sclerosis.    Disease summary:She is a 50 y.o.who had an attack of diplopia and left facial weakness in 1998 with a lesion in the pons, and was diagnosed with MS in 2003 after an attack of bilateral leg weakness and sensory symptoms, with new brain lesions typical of MS and CSF with oligoclonal bands (and subsequent negative AQP4 and MOG serologies). She was treated with glatiramer acetate from 2004 to 2021 andhad nofurther attacks but struggled with faitigue, spasmsand cognitive symptoms (due to which she is on disability).  She switched to Tecfidera in July 2021 after MRI showed a new cervical spine lesion that had developed while on glatiramer acetate, developed intolerable GI symptoms after 4-5 months on it, tried Vumerity which she had to stop due to severe abdominal pains and nausea, and resumed glatiramer acetate in April 2022.    Interval history:  Since her last visit in April she's felt stable with no new or worsening symptoms.  She's tolerating glatiramer acetate well, and has had no more abdominal pain since stopping Vumerity.    She continues to struggle with fatigue, amantadine 200 mg helps in the morning but the effect wears off in the afternoon.  Sleep is disrupted by getting up 2-3 times per night to urinate -she takes Myrbetriq in the morning and wonders if this might be more effective overnight if she switched it to the evenings.      She uses tizanidine nightly as needed for spasms, and would like to resume medical marijuana which can also help with this with less sedation.  She has some low back pain, and some neck pain with occasional tingling radiating down  her right arm.    ROS  Review of systems (including constitutional, HEENT, cardiovascular, respiratory, GI, genital-urinary, musculoskeletal, skin, psycho-social, hematological, endocrine and pain) is otherwise negative.    Patient's problem list, allergies, and medications were reviewed and updated as appropriate.  Please see the EHR for full details.    Exam and data reviewed:  She looks well, is alert and attentive with normal language and good recall.  Visual fields are full to confrontation, versions full without nystagmus, facial sensation and expression symmetric, no dysarthria, shrug is even, tongue is midline.  She has no tremor or pronator drift, arm movements are symmetric, and she has no dysmetria on finger-to-nose or finger taps.  She stands from a deep squat with minimal difficulty, and walks with a steady normal-based gait, and tandems with no trouble.    I reviewed her MRIs from 01/14/2021, which showed stable lesion burden in the brain and cervical spine as well as a disc osteophyte complex at C6-7 with bilateral foraminal narrowing but no impingement on the spinal cord.    Assessment & Plan:  Multiple sclerosis with a relapsing component that has been stable through trials of Tecfidera and Vumerity and now on glatiramer acetate which she tolerates well.  While she did previously develop a new spinal cord lesion while on glatiramer acetate, this was detected over a long interval between scans (2009-2021) without any other activity in the meantime.  She did not tolerate few minutes and was not comfortable with higher potency medications  due to the degree of immunosuppression, and given that her MS activity has not been highly aggressive it is reasonable to continue with glatiramer acetate.    -Continue glatiramer acetate 40 mg 3 times weekly  -Physical therapy (at home through [facility], through which she already has aid services) to focus on mobility exercises and strengthening for low back pain and  cervical pain with right C7 radiculopathy.  -She may try an additional 100 mg of amantadine at midday (and continue 200 mg every morning) for fatigue  -She will shift Myrbetriq 50 mg to the evening to see if this helps with urinary frequency overnight.  If this does not she may try adding oxybutynin 5 mg (short acting) at bedtime.  -Recertify for [facility] medical marijuana program for control of spasticity, and continue tizanidine at bedtime as needed.  -Follow-up in 6 months by telemedicine, or sooner if needed.    [name], MD  01/22/2021  12:18 PM  __________________________________________  I personally spent 35 minutes on the calendar day of the encounter, including pre and post visit work.  The plan was discussed with the patient and the patient/patient rep demonstrated understanding to the provider's satisfaction.  Consent was previously obtained from the patient to complete this video visit.

## 2021-01-22 NOTE — Telephone Encounter (Signed)
-----   Message from [name], MD sent at 01/22/2021  1:08 PM EDT -----  Regarding: home PT  Jodi Butler gets home services through [facility], and if possible we'd like to add home PT - eval and treat for low back pain and cervical pain with mild right C7 radiculopathy (including mobility and strengthening exercises).    Thanks,  [employer]

## 2021-01-22 NOTE — Progress Notes (Signed)
Behavioral Health Progress Note     Length of Session: 45 minutes    Contact Type:  Location: Off Site    Direct, Face to Face, Individual Psychotherapy     Patient encounter was performed via Video     Patient located at: home    Provider located at: clinical office    Other participants in this encounter and roles:  None.     Consent was previously obtained from the patient to complete this service via telehealth; including the potential for financial liability.      Problem(s)/Goals Addressed from Treatment Plan:    Problem 1:   Treatment Problem #1 12/31/2020   Patient Identified Problem Depression       Goal for this problem:    Treatment Goal #1 12/31/2020   Patient Identified Goal Better manage symptoms.        Progress towards this goal: Problem resolving. Comment: Client is utilizing meditiation to become more aware of her thoughts/beliefs,       Mental Status Exam:  APPEARANCE: Appears stated age  ATTITUDE TOWARD INTERVIEWER: Cooperative  MOTOR ACTIVITY: WNL (within normal limits)  EYE CONTACT: Direct  SPEECH: Soft  AFFECT: Full Range  MOOD: Neutral  THOUGHT PROCESS: Normal  THOUGHT CONTENT: No unusual themes  PERCEPTION: Within normal limits  CURRENT SUICIDAL IDEATION: patient denies  CURRENT HOMICIDAL IDEATION: Patient denies  ORIENTATION: Alert and Oriented X 3.  CONCENTRATION: WNL  MEMORY:   Recent: impaired Client states that her memory is impaired by MS.    Remote: Client states that her memory is impaired by MS.   COGNITIVE FUNCTION: WNL.   JUDGMENT: Intact  IMPULSE CONTROL: Poor  INSIGHT: Fair    Risk Assessment:  ASSESSMENT OF RISK FOR SUICIDAL BEHAVIOR  Changes in risk for suicide from baseline Formulation of Risk and/or previous intake, including newly identified risk, if any: none  Violence risk was assessed and updated    Session Content::   Client reported that she is doing alright. Client went through her values activity this morning, client and undersigned processed client's experience with  the activity, and her plan to put together the goal section this week. Client reported that she was able to carve out some time to relax and let mind her wonder. Client and undersigned discussed bringing attention and intention to this practice, to practice being aware of the thoughts she is having in order to more easily identify helpful/unhelpful thoughts. Client reported that she is thinking about journaling, which undersigned encouraged. Client and undersigned discussed behavioral activation and habit formation, client stated that journaling after her morning coffee could work to enforce the habit. Client and undersigned explored client's feelings around the increased frequency of people in her life getting sick. Client described how she is more easily able to identify when she is feeling frustrated now, (feel mentally getting tired, legs getting weak) so that she is able to walk away from tasks in time to avoid taking up too much of her energy. Client and undersigned explored client feeling like talking about herself is out of her comfort zone, and how this is underlined by her Mom's tendency to ask unhelpful questions. Client and undersigned explored client pushing herself to get support from support groups, in order to practice opening up with people who have similar experiences, and can understand more what she is going through. Client and undersigned discussed undersigned leaving the clinic, and what the transfer process looks like.       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    NEXT APPT: 9/1      Jodi Butler

## 2021-01-22 NOTE — Telephone Encounter (Signed)
Pt is requesting MM certification renewal.  Will defer to Dr. Hughes Better.

## 2021-01-22 NOTE — Telephone Encounter (Signed)
[facility] faxed over Home Care Orders for the patient that need to be signed and dated by the provider. The forms have been placed in the provider's bin for signature and date.

## 2021-01-22 NOTE — Telephone Encounter (Signed)
Faxed demographics sheet, last office visit note, and referral for PT (per Dr. Tenna Delaine request) over to [facility] at [phone] and received fax confirmation.

## 2021-01-23 ENCOUNTER — Telehealth: Payer: Self-pay | Admitting: Neurology

## 2021-01-23 NOTE — Telephone Encounter (Signed)
Per [employer] notes:    Follow up in about 26 weeks (around 07/23/2021) for Telemedicine     Left message to call access ctr to schedule. Many video slots available in Feb on Monday and Thursday mornings.

## 2021-01-23 NOTE — Telephone Encounter (Signed)
Forms signed and placed in my signed bin

## 2021-01-23 NOTE — Telephone Encounter (Signed)
The forms have been faxed, [phone] back and confirmed. The original have been placed in scanning.

## 2021-01-27 ENCOUNTER — Telehealth: Payer: Self-pay | Admitting: Neurology

## 2021-01-27 NOTE — Telephone Encounter (Signed)
Megan from [facility] calling in regarding the PT referral that was sent in. She states the patient was scheduled for PT today but did not show up and they were unable to contact her. Because of this, the referral is being closed.    Aundra Millet can be reached at  [phone].

## 2021-01-27 NOTE — Telephone Encounter (Signed)
Returned call to [place] at [facility] to clarify message. She said that they were trying to reach patient to set up home PT, but was unable to reach patient. If Jala needs the PT service, she will need to call [phone] and choose option 2. Will update Shayanna via [employer].

## 2021-01-29 ENCOUNTER — Encounter: Payer: Self-pay | Admitting: Gastroenterology

## 2021-01-30 NOTE — Telephone Encounter (Signed)
Letter completed to [employer]; please check if this helps get the test paid for now, otherwise patient prefers to wait 1 year    Farrell Ours, MD  [facility]  01/30/2021  7:52 AM

## 2021-02-04 ENCOUNTER — Other Ambulatory Visit: Payer: Self-pay | Admitting: Neurology

## 2021-02-04 DIAGNOSIS — G35 Multiple sclerosis: Secondary | ICD-10-CM

## 2021-02-05 ENCOUNTER — Encounter: Payer: Self-pay | Admitting: Neurology

## 2021-02-05 ENCOUNTER — Ambulatory Visit: Payer: Medicare (Managed Care)

## 2021-02-05 DIAGNOSIS — G35 Multiple sclerosis: Secondary | ICD-10-CM

## 2021-02-05 MED ORDER — AMANTADINE HCL 100 MG PO TABS *A*
200.0000 mg | ORAL_TABLET | Freq: Every day | ORAL | 5 refills | Status: DC
Start: 2021-02-05 — End: 2021-02-17

## 2021-02-05 NOTE — Telephone Encounter (Signed)
Pt canceled 9/1; 3:00 appointment with [facility], conflict, rescheduled for 9/13 for 11:15

## 2021-02-05 NOTE — Telephone Encounter (Signed)
NI Staff: Can you please contact patient to schedule follow up in February for telemedicine visit? Thank you!     Date of last visit and plan for FUV: 01/22/21 - 26 weeks  Date of FUV? Needs visit scheduled  Date of last MRI and plan for next: 01/14/21  Is MRI ordered? None ordered   DMT (including alternative dosing if applicable): glatiramer    Labs:      Lab results: 08/08/20  1635   Sodium 142   Potassium 3.6   Chloride 103   CO2 27   UN 7   Creatinine 0.70   Glucose 101*   Calcium 9.4           Lab results: 08/08/20  1635 08/06/20  1412   Total Protein 6.6 6.6   Albumin 4.4 4.4   ALT 38* 35   AST 34 26   Alk Phos 75 79   Bilirubin,Total 0.4 0.8   Bilirubin,Direct  --  <0.2           Lab results: 08/08/20  1635   WBC 4.6   Hemoglobin 13.3   Hematocrit 41   RBC 4.4   Platelets 204   Neut # K/uL 2.8   Lymph # K/uL 1.3   Mono # K/uL 0.5   Eos # K/uL 0.0   Baso # K/uL 0.0   Seg Neut % 59.9   Lymphocyte % 28.1   Monocyte % 10.6   Eosinophil % 0.4   Basophil % 0.6

## 2021-02-06 MED ORDER — NON-SYSTEM MEDICATION *A*
0 refills | Status: AC
Start: 2021-02-06 — End: ?

## 2021-02-06 NOTE — Telephone Encounter (Signed)
MD sent text page regarding MM recertifcation.

## 2021-02-06 NOTE — Telephone Encounter (Signed)
Pt requesting motorized scooter to be faxed to [facility] at [phone].  Order placed and sent to NP pool for signature

## 2021-02-10 ENCOUNTER — Other Ambulatory Visit: Payer: Self-pay

## 2021-02-17 ENCOUNTER — Ambulatory Visit: Payer: Medicare (Managed Care) | Attending: Psychiatry

## 2021-02-17 ENCOUNTER — Other Ambulatory Visit: Payer: Self-pay | Admitting: Adult Health

## 2021-02-17 DIAGNOSIS — F32A Depression, unspecified: Secondary | ICD-10-CM | POA: Insufficient documentation

## 2021-02-17 DIAGNOSIS — G35 Multiple sclerosis: Secondary | ICD-10-CM | POA: Insufficient documentation

## 2021-02-17 NOTE — Progress Notes (Signed)
Behavioral Health Progress Note     Length of Session: 20 minutes    Contact Type:  Location: Off Site    Direct, Face to Face, Individual Psychotherapy     Patient encounter was performed via Video     Patient located at: home    Provider located at: clinical office    Other participants in this encounter and roles:  None.     Consent was previously obtained from the patient to complete this service via telehealth; including the potential for financial liability.      Problem(s)/Goals Addressed from Treatment Plan:    Problem 1:   Treatment Problem #1 12/31/2020   Patient Identified Problem Depression       Goal for this problem:    Treatment Goal #1 12/31/2020   Patient Identified Goal Better manage symptoms.        Progress towards this goal: Problem resolving. Comment: Client is utilizing meditiation to become more aware of her thoughts/beliefs,       Mental Status Exam:  APPEARANCE: Appears stated age  ATTITUDE TOWARD INTERVIEWER: Cooperative  MOTOR ACTIVITY: WNL (within normal limits)  EYE CONTACT: Direct  SPEECH: Soft  AFFECT: Full Range  MOOD: Neutral  THOUGHT PROCESS: Normal  THOUGHT CONTENT: No unusual themes  PERCEPTION: Within normal limits  CURRENT SUICIDAL IDEATION: patient denies  CURRENT HOMICIDAL IDEATION: Patient denies  ORIENTATION: Alert and Oriented X 3.  CONCENTRATION: WNL  MEMORY:   Recent: impaired Client states that her memory is impaired by MS.    Remote: Client states that her memory is impaired by MS.   COGNITIVE FUNCTION: WNL.   JUDGMENT: Intact  IMPULSE CONTROL: Poor  INSIGHT: Fair    Risk Assessment:  ASSESSMENT OF RISK FOR SUICIDAL BEHAVIOR  Changes in risk for suicide from baseline Formulation of Risk and/or previous intake, including newly identified risk, if any: none  Violence risk was assessed and updated    Session Content::   Client reported that she is going through things in the attic, since last talking with this Clinical research associate. Client described how she is watching [place], and  the motivation client is getting from that show. Client identified that going through old stuff is a huge deal, as this is something she has been putting off for 10 years. Client identified that it is a big weight off of her shoulders. Client and undersigned explored clients felt difficulty with procrastination. Client and undersigned discussed clients therapeutic work so far and what she would like to continue to work on. Client identified that having goals is helpful, to get satisfaction and have meaning. Client would also like to continue work on having a verbal filter, and is trying very much to think about what she says before she says it. Client and undersigned explored clients transition, and to expect a call from this writer or the front end to set up a follow up appointment with her new therapist.     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    NEXT APPT: 10/7      Jodi Butler

## 2021-02-18 ENCOUNTER — Telehealth: Payer: Self-pay | Admitting: Neurology

## 2021-02-18 ENCOUNTER — Encounter: Payer: Self-pay | Admitting: Gastroenterology

## 2021-02-18 NOTE — Telephone Encounter (Signed)
Received order from [facility] to be signed. Placed in Lindsay's mailbox.

## 2021-02-18 NOTE — Telephone Encounter (Signed)
NI Staff: Please contact patient to schedule follow up appointment-can be telemedicine-with Dr. Miki Kins. Thank you!     Date of last visit and plan for FUV: 01/22/21-26 weeks   Date of FUV? Not yet scheduled  Date of last MRI and plan for next: 01/14/21  Is MRI ordered? None ordered   DMT (including alternative dosing if applicable): glatiramer     Labs:      Lab results: 08/08/20  1635   Sodium 142   Potassium 3.6   Chloride 103   CO2 27   UN 7   Creatinine 0.70   Glucose 101*   Calcium 9.4           Lab results: 08/08/20  1635 08/06/20  1412   Total Protein 6.6 6.6   Albumin 4.4 4.4   ALT 38* 35   AST 34 26   Alk Phos 75 79   Bilirubin,Total 0.4 0.8   Bilirubin,Direct  --  <0.2           Lab results: 08/08/20  1635   WBC 4.6   Hemoglobin 13.3   Hematocrit 41   RBC 4.4   Platelets 204   Neut # K/uL 2.8   Lymph # K/uL 1.3   Mono # K/uL 0.5   Eos # K/uL 0.0   Baso # K/uL 0.0   Seg Neut % 59.9   Lymphocyte % 28.1   Monocyte % 10.6   Eosinophil % 0.4   Basophil % 0.6

## 2021-02-19 ENCOUNTER — Telehealth: Payer: Self-pay | Admitting: Neurology

## 2021-02-19 MED ORDER — AMANTADINE HCL 100 MG PO TABS *A*
200.0000 mg | ORAL_TABLET | Freq: Every day | ORAL | 5 refills | Status: AC
Start: 2021-02-19 — End: ?

## 2021-02-19 NOTE — Telephone Encounter (Signed)
Lexi, [facility] PT, calling in stating that she has pt on her schedule today, but the pt has stated she cannot be seen today or for the remainder of this week due to having to take her mother to an appt that pt must be present for.    Lexi is going to send over a new referral request so they can do their pt evaluation for pt next week.     FYI    Lexi can be reached at [phone]

## 2021-02-22 ENCOUNTER — Other Ambulatory Visit: Payer: Self-pay

## 2021-02-23 ENCOUNTER — Telehealth: Payer: Self-pay

## 2021-02-23 NOTE — Telephone Encounter (Signed)
Received home care order    Put into lindsays mailbox for signature

## 2021-02-23 NOTE — Telephone Encounter (Signed)
Last office visit:   12/12/2020  Patients upcoming appointments:  Future Appointments   Date Time Provider [place]   03/13/2021 11:00 AM Sharolyn Douglas, LCSW-R [facility] None   04/17/2021  2:20 PM Patriciaann Clan, MD [facility] None   10/16/2021  2:00 PM Johny Drilling, MD [facility] None       Recent Lab Values 02/17/2018   EXP DATE 04/06/18       Recent Lab results:  GENERAL CHEMISTRY   Recent Labs     08/08/20  1635 08/06/20  1412 08/04/20  1047   NA 142  --   --    K 3.6  --   --    CL 103  --   --    CO2 27  --   --    GAP 12  --   --    UN 7  --   --    CREAT 0.70 0.61  --    GLU 101*  --  74   CA 9.4  --   --       LIPID PROFILE   No value within the past 365 days   LIVER PROFILE   Recent Labs     08/08/20  1635 08/06/20  1412 08/04/20  1047   ALT 38* 35 31   AST 34 26 30   ALK 75 79 84   TB 0.4 0.8 0.3      DIABETES THYROID   No value within the past 365 days Recent Labs     08/04/20  1047   TSH 1.33         Pending/Orders Labs:  Lab Frequency Next Occurrence   Lab Only EKG Once 08/20/2020

## 2021-02-25 ENCOUNTER — Encounter: Payer: Self-pay | Admitting: Primary Care

## 2021-02-25 ENCOUNTER — Other Ambulatory Visit: Payer: Self-pay | Admitting: Adult Health

## 2021-02-25 DIAGNOSIS — G35 Multiple sclerosis: Secondary | ICD-10-CM

## 2021-02-25 MED ORDER — ONDANSETRON HCL 4 MG PO TABS *I*
4.0000 mg | ORAL_TABLET | Freq: Three times a day (TID) | ORAL | 1 refills | Status: DC | PRN
Start: 2021-02-25 — End: 2021-04-24

## 2021-02-25 NOTE — Telephone Encounter (Signed)
Update Care Everywhere      Date of last visit and plan for FUV:   01/22/2021, RTC 6 months  Date of FUV?  Note to schedulers  Date of last MRI and plan for next: 01/14/2021   Is MRI ordered?  none  DMT (include alternate dosing if applicable):  Glatiramer acetate  Labs:      Lab results: 08/08/20  1635   Sodium 142   Potassium 3.6   Chloride 103   CO2 27   UN 7   Creatinine 0.70   Glucose 101*   Calcium 9.4             Lab results: 08/08/20  1635 08/06/20  1412   Total Protein 6.6 6.6   Albumin 4.4 4.4   ALT 38* 35   AST 34 26   Alk Phos 75 79   Bilirubin,Total 0.4 0.8   Bilirubin,Direct  --  <0.2           Lab results: 08/08/20  1635   WBC 4.6   Hemoglobin 13.3   Hematocrit 41   RBC 4.4   Platelets 204   Neut # K/uL 2.8   Lymph # K/uL 1.3   Mono # K/uL 0.5   Eos # K/uL 0.0   Baso # K/uL 0.0   Seg Neut % 59.9   Lymphocyte % 28.1   Monocyte % 10.6   Eosinophil % 0.4   Basophil % 0.6

## 2021-02-25 NOTE — Telephone Encounter (Signed)
Refill requested

## 2021-02-26 ENCOUNTER — Telehealth: Payer: Self-pay | Admitting: Neurology

## 2021-02-26 NOTE — Telephone Encounter (Signed)
Received orders to be signed from [facility]. Placed in Lindsay's mailbox.

## 2021-03-03 ENCOUNTER — Telehealth: Payer: Self-pay | Admitting: Neurology

## 2021-03-03 NOTE — Telephone Encounter (Signed)
Lexi from [facility] calling in to report at visit today with pt, pt reports her back pain was a 7/10. Lexi states this is higher than normal, so they need to report it to MD. Lorain Childes

## 2021-03-04 NOTE — Telephone Encounter (Signed)
Sent message to Jodi Butler to follow up regarding her back pain. Will forward to clinician once she responds.

## 2021-03-06 ENCOUNTER — Other Ambulatory Visit: Payer: Self-pay | Admitting: Primary Care

## 2021-03-06 ENCOUNTER — Encounter: Payer: Self-pay | Admitting: Adult Health

## 2021-03-06 ENCOUNTER — Encounter: Payer: Self-pay | Admitting: Gastroenterology

## 2021-03-06 ENCOUNTER — Telehealth: Payer: Self-pay | Admitting: Neurology

## 2021-03-06 DIAGNOSIS — F32A Depression, unspecified: Secondary | ICD-10-CM

## 2021-03-06 NOTE — Telephone Encounter (Signed)
Faxed back to [phone], fax confirmed and sent to scanning.

## 2021-03-06 NOTE — Telephone Encounter (Signed)
Last office visit:   12/12/2020  Patients upcoming appointments:  Future Appointments   Date Time Provider [place]   03/13/2021 11:00 AM Sharolyn Douglas, LCSW-R [facility] None   03/17/2021  2:10 PM CANALSIDE, [place] [facility] None   04/17/2021  2:20 PM Patriciaann Clan, MD [facility] None   10/16/2021  2:00 PM Johny Drilling, MD [facility] None       Recent Lab Values 02/17/2018   EXP DATE 04/06/18       Recent Lab results:  GENERAL CHEMISTRY   Recent Labs     08/08/20  1635 08/06/20  1412 08/04/20  1047   NA 142  --   --    K 3.6  --   --    CL 103  --   --    CO2 27  --   --    GAP 12  --   --    UN 7  --   --    CREAT 0.70 0.61  --    GLU 101*  --  74   CA 9.4  --   --       LIPID PROFILE   No value within the past 365 days   LIVER PROFILE   Recent Labs     08/08/20  1635 08/06/20  1412 08/04/20  1047   ALT 38* 35 31   AST 34 26 30   ALK 75 79 84   TB 0.4 0.8 0.3      DIABETES THYROID   No value within the past 365 days Recent Labs     08/04/20  1047   TSH 1.33         Pending/Orders Labs:  Lab Frequency Next Occurrence   Lab Only EKG Once 08/20/2020

## 2021-03-06 NOTE — Telephone Encounter (Signed)
Received orders to be signed from [facility]. Placed in Lindsay's mailbox.

## 2021-03-09 ENCOUNTER — Telehealth: Payer: Self-pay | Admitting: Neurology

## 2021-03-09 NOTE — Telephone Encounter (Signed)
Received order request from [facility] to be signed. Placed in Lindsay's mailbox.

## 2021-03-11 ENCOUNTER — Other Ambulatory Visit: Payer: Self-pay

## 2021-03-13 ENCOUNTER — Other Ambulatory Visit: Payer: Self-pay

## 2021-03-13 ENCOUNTER — Ambulatory Visit: Payer: Medicare (Managed Care) | Attending: Psychiatry

## 2021-03-13 DIAGNOSIS — F32A Depression, unspecified: Secondary | ICD-10-CM | POA: Insufficient documentation

## 2021-03-13 DIAGNOSIS — G35 Multiple sclerosis: Secondary | ICD-10-CM | POA: Insufficient documentation

## 2021-03-13 NOTE — Progress Notes (Signed)
Behavioral Health Progress Note   Patient encounter was performed via Video     Patient located at: home    Provider located at: clinical office    Other participants in this encounter and roles:  NA    Consent was previously obtained from the patient to complete this service via telehealth; including the potential for financial liability.  Length of Session: 45 minutes    Contact Type:  Location: On Site    Other: Telemedicine contact     Problem(s)/Goals Addressed from Treatment Plan:    Problem 1:   Treatment Problem #1 12/31/2020   Patient Identified Problem Depression       Goal for this problem:    Treatment Goal #1 12/31/2020   Patient Identified Goal Better manage symptoms.        Progress towards this goal: Problem resolving. Comment: Client reports that she has been feeling a bit better and goal oriented    Mental Status Exam:  APPEARANCE: Casual  ATTITUDE TOWARD INTERVIEWER: Cooperative  MOTOR ACTIVITY: not assessed  EYE CONTACT: not assessed  SPEECH: Normal rate and tone  AFFECT: Full Range  MOOD: Normal  THOUGHT PROCESS: Goal directed  THOUGHT CONTENT: No unusual themes  PERCEPTION: Within normal limits  CURRENT SUICIDAL IDEATION: patient denies  CURRENT HOMICIDAL IDEATION: Patient denies  ORIENTATION: Alert and Oriented X 3.  CONCENTRATION: WNL  MEMORY:   Recent: intact   Remote: intact  COGNITIVE FUNCTION: Average intelligence  JUDGMENT: Intact  IMPULSE CONTROL: Good  INSIGHT: Good    Risk Assessment:  ASSESSMENT OF RISK FOR SUICIDAL BEHAVIOR  Changes in risk for suicide from baseline Formulation of Risk and/or previous intake, including newly identified risk, if any: none    Session Content::  Writer met with client for the first time as a transfer from her therapist who left the clinic. Client is a 50 yo female living with her wife, [name], of 27 years. Client has multiple sclerosis, the symptoms of which began in 1993 leading to diagnosis in 2004. 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 She reports that, since then, she has struggled with resulting anxiety and depression. Though encouraged by her PCP to seek counseling for a long time, she reports coming to a point of readiness this summer. She reports that lately her mood had improved. She does report having some anxiety in crowds and, at that time, is more likely to experience difficulty with word finding that is associated with her condition. Writer suggested that she talk with her providers about a referral to speech therapy to assess if they may be able to help with techniques to mitigate some of the symptoms. Discussed her therapy goals going forward and she reports being focused on increasing fulfilling activities each day and has been purging belongings in the attic lately which she has found helpful. Talked about how this contributes to a feeling of building mastery which is important to mental health. Also discussed the seasonal mood issues she experiences and the degree to which they are tied to flare-ups in her medical condition and the inability to get out of the house sometimes. Writer suggested that she look at programs through [employer] where she has the option of participating online and would also serve to help her build a schedule. Writer reports that her wife and her parents, who  live a few houses down  and sister are her greatest supports. However, client reports that she can become irritable when in too much pain or trying to do something with which she is having difficulty and snap but has been trying to take space when that happens. Discussed how activity based pacing works with moving from pushing herself when feeling well to consistently engaging in an activity for a reasonable timeframe where she would ensure not prompting extreme fatigue the following day. Briefly discussed her relationship with her  parents and her conflict in thinking about moving to be with wife's family in [place] and avoid cold winter. Also discussed how her mother's comparisons of her to father, who also has multiple sclerosis can be difficult to hear. Writer validated her feelings and discussed her thoughts on addressing it with her mother.    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    NEXT APPT: 3 weeks      Sharolyn Douglas, LCSW-R

## 2021-03-16 ENCOUNTER — Other Ambulatory Visit: Payer: Self-pay

## 2021-03-17 ENCOUNTER — Ambulatory Visit: Payer: Medicare (Managed Care)

## 2021-03-20 ENCOUNTER — Encounter: Payer: Self-pay | Admitting: Gastroenterology

## 2021-03-23 ENCOUNTER — Telehealth: Payer: Self-pay | Admitting: Neurology

## 2021-03-23 ENCOUNTER — Encounter: Payer: Self-pay | Admitting: Gastroenterology

## 2021-03-23 NOTE — Telephone Encounter (Signed)
Jodi Butler from [facility] calling in stating that she discharged the patient from home PT today due to her meeting all of her goals.     Sent as FYI.

## 2021-03-31 ENCOUNTER — Encounter: Payer: Self-pay | Admitting: Gastroenterology

## 2021-04-03 ENCOUNTER — Telehealth: Payer: Self-pay

## 2021-04-03 ENCOUNTER — Ambulatory Visit: Payer: Medicare (Managed Care)

## 2021-04-03 DIAGNOSIS — G35 Multiple sclerosis: Secondary | ICD-10-CM

## 2021-04-03 NOTE — Progress Notes (Signed)
[facility] MISSED/CANCELLED APPOINTMENT     Name: Jodi Butler  MRN: [id number]   DOB: 07/02/1970    Date of Scheduled Service: 04/03/21    [name] was a no show for today's appointment.  I have left the patient a message to contact me. and sent the patient a letter regarding today's missed appointment.    Additional Information:    Not applicable

## 2021-04-03 NOTE — Telephone Encounter (Signed)
[facility] Telephone Call     Date of call: 04/03/2021    Name: Jodi Butler   DOB: 1971-04-10   MRN: [id number]     Writer attempted to contact client as she did not show up for her zoom visit and left message for her to return the phone call.

## 2021-04-07 ENCOUNTER — Telehealth: Payer: Self-pay

## 2021-04-07 NOTE — Telephone Encounter (Signed)
I called the patient and left a message asking them to give our office a call back. Please reschedule the patient's appointment from 11/11 with Dr Christell Constant or Tobi Bastos. Patient had been scheduled with Dr Michael Litter in error.

## 2021-04-08 ENCOUNTER — Other Ambulatory Visit: Payer: Self-pay | Admitting: Adult Health

## 2021-04-08 ENCOUNTER — Other Ambulatory Visit: Payer: Self-pay

## 2021-04-08 DIAGNOSIS — G35 Multiple sclerosis: Secondary | ICD-10-CM

## 2021-04-08 MED ORDER — GLATIRAMER ACETATE 40 MG/ML SC SOSY *I*
40.0000 mg | PREFILLED_SYRINGE | SUBCUTANEOUS | 5 refills | Status: DC
Start: 2021-04-08 — End: 2021-09-23
  Filled 2021-04-08: qty 12, 28d supply, fill #0
  Filled 2021-05-08: qty 12, 28d supply, fill #1

## 2021-04-10 ENCOUNTER — Other Ambulatory Visit: Payer: Self-pay

## 2021-04-10 ENCOUNTER — Encounter: Payer: Self-pay | Admitting: Neurology

## 2021-04-10 NOTE — Telephone Encounter (Signed)
I spoke with the patient and she states that she will call us back to reschedule the appointment.

## 2021-04-13 ENCOUNTER — Other Ambulatory Visit: Payer: Self-pay

## 2021-04-13 NOTE — Telephone Encounter (Signed)
Will defer pt's MyChart message to NP pool for recommendations.

## 2021-04-15 ENCOUNTER — Other Ambulatory Visit: Payer: Self-pay

## 2021-04-17 ENCOUNTER — Ambulatory Visit: Payer: Medicare (Managed Care) | Admitting: Primary Care

## 2021-04-21 ENCOUNTER — Telehealth: Payer: Self-pay | Admitting: Neurology

## 2021-04-21 NOTE — Telephone Encounter (Signed)
Received home care order from [facility] to be signed and faxed back.   Placed in Lindsay's mailbox.

## 2021-04-22 ENCOUNTER — Other Ambulatory Visit: Payer: Self-pay

## 2021-04-22 ENCOUNTER — Telehealth: Payer: Self-pay

## 2021-04-22 DIAGNOSIS — Z9109 Other allergy status, other than to drugs and biological substances: Secondary | ICD-10-CM

## 2021-04-22 DIAGNOSIS — F32A Depression, unspecified: Secondary | ICD-10-CM

## 2021-04-22 NOTE — Telephone Encounter (Signed)
Patient's spouse called and states they will be moving out of town soon and would like to know if she can have 90 day supplies of all her medications before she moves, since they have not yet been able to establish care with a new provider. Please advise

## 2021-04-23 MED ORDER — VENLAFAXINE HCL 75 MG PO CP24 *I*
75.0000 mg | ORAL_CAPSULE | Freq: Every day | ORAL | 1 refills | Status: AC
Start: 2021-04-23 — End: ?

## 2021-04-23 MED ORDER — VENLAFAXINE HCL 150 MG PO CP24 *I*
150.0000 mg | ORAL_CAPSULE | Freq: Every day | ORAL | 1 refills | Status: AC
Start: 2021-04-23 — End: ?

## 2021-04-23 MED ORDER — TRIAMCINOLONE ACETONIDE 0.1 % EX OINT *I*
TOPICAL_OINTMENT | CUTANEOUS | 3 refills | Status: AC
Start: 2021-04-23 — End: ?

## 2021-04-23 MED ORDER — CARBAMIDE PEROXIDE 6.5 % OT SOLN *I*
5.0000 [drp] | Freq: Two times a day (BID) | OTIC | 0 refills | Status: AC
Start: 2021-04-23 — End: ?

## 2021-04-23 MED ORDER — FLUTICASONE PROPIONATE 50 MCG/ACT NA SUSP *I*
NASAL | 1 refills | Status: AC
Start: 2021-04-23 — End: ?

## 2021-04-23 MED ORDER — BUPROPION HCL 300 MG PO TB24 *I*
300.0000 mg | ORAL_TABLET | Freq: Every morning | ORAL | 1 refills | Status: AC
Start: 2021-04-23 — End: ?

## 2021-04-23 MED ORDER — CETIRIZINE HCL 10 MG PO TABS *I*
10.0000 mg | ORAL_TABLET | Freq: Every day | ORAL | 1 refills | Status: DC
Start: 2021-04-23 — End: 2021-06-09

## 2021-04-23 MED ORDER — FAMOTIDINE 20 MG PO TABS *I*
20.0000 mg | ORAL_TABLET | Freq: Two times a day (BID) | ORAL | 1 refills | Status: AC
Start: 2021-04-23 — End: 2021-10-20

## 2021-04-23 MED ORDER — TIZANIDINE HCL 4 MG PO TABS *I*
ORAL_TABLET | ORAL | 1 refills | Status: AC
Start: 2021-04-23 — End: ?

## 2021-04-23 NOTE — Telephone Encounter (Signed)
30 day supply pended for all meds ordered by PCP office.         Last office visit:   12/12/2020  Patients upcoming appointments:  Future Appointments   Date Time Provider [place]   10/16/2021  2:00 PM Johny Drilling, MD [facility] None       Recent Lab Values 02/17/2018   EXP DATE 04/06/18       Recent Lab results:  GENERAL CHEMISTRY   Recent Labs     08/08/20  1635 08/06/20  1412 08/04/20  1047   NA 142  --   --    K 3.6  --   --    CL 103  --   --    CO2 27  --   --    GAP 12  --   --    UN 7  --   --    CREAT 0.70 0.61  --    GLU 101*  --  74   CA 9.4  --   --       LIPID PROFILE   No value within the past 365 days   LIVER PROFILE   Recent Labs     08/08/20  1635 08/06/20  1412 08/04/20  1047   ALT 38* 35 31   AST 34 26 30   ALK 75 79 84   TB 0.4 0.8 0.3      DIABETES THYROID   No value within the past 365 days Recent Labs     08/04/20  1047   TSH 1.33         Pending/Orders Labs:  Lab Frequency Next Occurrence   Lab Only EKG Once 08/20/2020

## 2021-04-23 NOTE — Telephone Encounter (Signed)
Of course; does  she want 90d sent now? I can also include 1 refill or send to new pharmacy when she moves  Please wish her well for me and let her and her wife know I will miss them!

## 2021-04-23 NOTE — Telephone Encounter (Signed)
rx sent

## 2021-04-23 NOTE — Telephone Encounter (Signed)
Please prepare/pend refill for 30d supply

## 2021-04-23 NOTE — Telephone Encounter (Signed)
I spoke to the patient spouce and wished them well and she said thank you. [name] would like one refill for Jodi Butler's medications to be sent now to [facility]. After they have moved and know what pharmacy  they would like medication to be sent to, she will call us for the 90 day supply of the medication.

## 2021-04-24 ENCOUNTER — Other Ambulatory Visit: Payer: Self-pay | Admitting: Adult Health

## 2021-04-24 DIAGNOSIS — G35 Multiple sclerosis: Secondary | ICD-10-CM

## 2021-04-24 MED ORDER — ONDANSETRON HCL 4 MG PO TABS *I*
4.0000 mg | ORAL_TABLET | Freq: Three times a day (TID) | ORAL | 1 refills | Status: DC | PRN
Start: 2021-04-24 — End: 2021-06-10

## 2021-04-24 NOTE — Telephone Encounter (Signed)
Refill requested

## 2021-04-24 NOTE — Telephone Encounter (Signed)
NI Staff: Can you contact patient to schedule follow up televideo appointment in February 2023 with Dr. Miki Kins? Thank you!    Date of last visit and plan for FUV: 01/22/21  Date of FUV? Needs to schedule   Date of last MRI and plan for next: 01/14/21  Is MRI ordered? None ordered   DMT (including alternative dosing if applicable): glatiramer     Labs:      Lab results: 08/08/20  1635   Sodium 142   Potassium 3.6   Chloride 103   CO2 27   UN 7   Creatinine 0.70   Glucose 101*   Calcium 9.4           Lab results: 08/08/20  1635 08/06/20  1412   Total Protein 6.6 6.6   Albumin 4.4 4.4   ALT 38* 35   AST 34 26   Alk Phos 75 79   Bilirubin,Total 0.4 0.8   Bilirubin,Direct  --  <0.2           Lab results: 08/08/20  1635   WBC 4.6   Hemoglobin 13.3   Hematocrit 41   RBC 4.4   Platelets 204   Neut # K/uL 2.8   Lymph # K/uL 1.3   Mono # K/uL 0.5   Eos # K/uL 0.0   Baso # K/uL 0.0   Seg Neut % 59.9   Lymphocyte % 28.1   Monocyte % 10.6   Eosinophil % 0.4   Basophil % 0.6

## 2021-04-29 NOTE — Telephone Encounter (Signed)
First Call- Left Message

## 2021-05-02 ENCOUNTER — Other Ambulatory Visit: Payer: Self-pay | Admitting: Adult Health

## 2021-05-02 ENCOUNTER — Other Ambulatory Visit: Payer: Self-pay | Admitting: Primary Care

## 2021-05-02 ENCOUNTER — Other Ambulatory Visit: Payer: Self-pay

## 2021-05-02 DIAGNOSIS — Z9109 Other allergy status, other than to drugs and biological substances: Secondary | ICD-10-CM

## 2021-05-02 DIAGNOSIS — G35 Multiple sclerosis: Secondary | ICD-10-CM

## 2021-05-04 MED ORDER — MIRABEGRON ER 50 MG PO TB24 *I*
50.0000 mg | ORAL_TABLET | Freq: Every day | ORAL | 3 refills | Status: AC
Start: 2021-05-04 — End: ?

## 2021-05-04 NOTE — Telephone Encounter (Signed)
Date of last visit and plan for FUV: 01/22/21 - 6 months  Date of FUV? Staff contacteon 11/23/22d patient by phone and mychart to schedule   Date of last MRI and plan for next: 01/14/21  Is MRI ordered? None ordered   DMT (including alternative dosing if applicable): glatiramer     Labs:      Lab results: 08/08/20  1635   Sodium 142   Potassium 3.6   Chloride 103   CO2 27   UN 7   Creatinine 0.70   Glucose 101*   Calcium 9.4           Lab results: 08/08/20  1635 08/06/20  1412   Total Protein 6.6 6.6   Albumin 4.4 4.4   ALT 38* 35   AST 34 26   Alk Phos 75 79   Bilirubin,Total 0.4 0.8   Bilirubin,Direct  --  <0.2           Lab results: 08/08/20  1635   WBC 4.6   Hemoglobin 13.3   Hematocrit 41   RBC 4.4   Platelets 204   Neut # K/uL 2.8   Lymph # K/uL 1.3   Mono # K/uL 0.5   Eos # K/uL 0.0   Baso # K/uL 0.0   Seg Neut % 59.9   Lymphocyte % 28.1   Monocyte % 10.6   Eosinophil % 0.4   Basophil % 0.6

## 2021-05-06 ENCOUNTER — Telehealth: Payer: Self-pay

## 2021-05-06 NOTE — Telephone Encounter (Signed)
Angel Perez, the patient's home care nurse, called and states that the patient moved out of state and needs to be discharged from Home Care services. Angel needs a verbal okay to be able to dismiss them. Angel #[phone]

## 2021-05-07 NOTE — Telephone Encounter (Signed)
Called and spoke to Angel, gave verbal okay to discharge pt, Angel verbalized understanding.

## 2021-05-07 NOTE — Telephone Encounter (Signed)
Farrell Ours, MD  Margaretha Seeds Med Nurse 12 hours ago (7:57 PM)     JM  Please provide verbal- ok to discharge

## 2021-05-08 ENCOUNTER — Other Ambulatory Visit: Payer: Self-pay

## 2021-05-11 ENCOUNTER — Other Ambulatory Visit: Payer: Self-pay

## 2021-05-13 ENCOUNTER — Other Ambulatory Visit: Payer: Self-pay

## 2021-05-15 ENCOUNTER — Other Ambulatory Visit: Payer: Self-pay

## 2021-05-15 ENCOUNTER — Encounter: Payer: Self-pay | Admitting: Gastroenterology

## 2021-05-15 NOTE — Progress Notes (Signed)
[facility] has faxed over home care orders to be signed. The orders have been placed in the provider's bin for processing. Once the form has been completed, please fax back to [facility] at [phone]

## 2021-05-18 NOTE — Progress Notes (Signed)
The home care form has been faxed and confirmed to [phone]. The original form has been placed in scanning.

## 2021-05-19 ENCOUNTER — Other Ambulatory Visit: Payer: Self-pay

## 2021-05-21 ENCOUNTER — Other Ambulatory Visit: Payer: Self-pay

## 2021-05-22 ENCOUNTER — Encounter: Payer: Self-pay | Admitting: Gastroenterology

## 2021-05-25 ENCOUNTER — Other Ambulatory Visit: Payer: Self-pay

## 2021-05-26 ENCOUNTER — Telehealth: Payer: Self-pay | Admitting: Primary Care

## 2021-05-26 NOTE — Telephone Encounter (Signed)
This medication was d/c'd 10/13/20 as clinically indicated.

## 2021-05-26 NOTE — Telephone Encounter (Signed)
Pharmacy faxed over request for     Duloxetine HCL DR 60 MG Cap.

## 2021-05-26 NOTE — Telephone Encounter (Signed)
Faxed back to [phone], fax confirmed and sent to scanning.

## 2021-05-27 ENCOUNTER — Other Ambulatory Visit: Payer: Self-pay

## 2021-05-29 ENCOUNTER — Other Ambulatory Visit: Payer: Self-pay

## 2021-06-02 ENCOUNTER — Telehealth: Payer: Self-pay | Admitting: Primary Care

## 2021-06-02 NOTE — Telephone Encounter (Signed)
[facility] called and need duloxetine 30mg s and 60mg  refilled. Please advise.

## 2021-06-02 NOTE — Telephone Encounter (Signed)
According to patients med list, medication has not been taken since May of 2022. Patient is no longer on this medication

## 2021-06-03 ENCOUNTER — Other Ambulatory Visit: Payer: Self-pay

## 2021-06-08 ENCOUNTER — Other Ambulatory Visit: Payer: Self-pay | Admitting: Family Medicine

## 2021-06-08 ENCOUNTER — Other Ambulatory Visit: Payer: Self-pay

## 2021-06-08 DIAGNOSIS — R1013 Epigastric pain: Secondary | ICD-10-CM

## 2021-06-08 DIAGNOSIS — Z9109 Other allergy status, other than to drugs and biological substances: Secondary | ICD-10-CM

## 2021-06-09 NOTE — Telephone Encounter (Signed)
Last office visit:   12/12/2020  Patients upcoming appointments:  Future Appointments   Date Time Provider [place]   10/16/2021  2:00 PM Johny Drilling, MD [facility] None       Recent Lab Values 02/17/2018   EXP DATE 04/06/18       Recent Lab results:  GENERAL CHEMISTRY   Recent Labs     08/08/20  1635 08/06/20  1412 08/04/20  1047   NA 142  --   --    K 3.6  --   --    CL 103  --   --    CO2 27  --   --    GAP 12  --   --    UN 7  --   --    CREAT 0.70 0.61  --    GLU 101*  --  74   CA 9.4  --   --       LIPID PROFILE   No value within the past 365 days   LIVER PROFILE   Recent Labs     08/08/20  1635 08/06/20  1412 08/04/20  1047   ALT 38* 35 31   AST 34 26 30   ALK 75 79 84   TB 0.4 0.8 0.3      DIABETES THYROID   No value within the past 365 days Recent Labs     08/04/20  1047   TSH 1.33         Pending/Orders Labs:  Lab Frequency Next Occurrence   Lab Only EKG Once 08/20/2020

## 2021-06-10 ENCOUNTER — Other Ambulatory Visit: Payer: Self-pay | Admitting: Adult Health

## 2021-06-10 DIAGNOSIS — G35 Multiple sclerosis: Secondary | ICD-10-CM

## 2021-06-10 MED ORDER — ONDANSETRON HCL 4 MG PO TABS *I*
4.0000 mg | ORAL_TABLET | Freq: Three times a day (TID) | ORAL | 0 refills | Status: AC | PRN
Start: 2021-06-10 — End: ?

## 2021-06-10 NOTE — Telephone Encounter (Signed)
Refill request

## 2021-06-12 ENCOUNTER — Other Ambulatory Visit: Payer: Self-pay

## 2021-06-19 ENCOUNTER — Other Ambulatory Visit: Payer: Self-pay

## 2021-06-19 NOTE — Progress Notes (Signed)
We have been unable to reach Gorden Harms regarding filling Glatiramer acetate 40 mg prescription despite 3 voicemails. Letter of no contact mailed 06/12/20. Estimate patient ran out of medication 05/14/21 if taken as prescribed.      Thank you,  Armando Reichert, PharmD  [facility]  Phone: [phone]  Fax: [phone]

## 2021-06-24 ENCOUNTER — Telehealth: Payer: Self-pay

## 2021-06-24 NOTE — Telephone Encounter (Signed)
SW called on behalf of specialty pharmacy to remind patient of time to refill medication. Left message explaining this and provided specialty pharmacy phone number.     Also sent a mychart message with the same information.

## 2021-06-25 ENCOUNTER — Encounter: Payer: Self-pay | Admitting: Primary Care

## 2021-06-25 ENCOUNTER — Telehealth: Payer: Self-pay

## 2021-06-25 NOTE — Telephone Encounter (Signed)
Left VM for patient to call back. Dr. Laurance Flatten would like to ask if she is moving out of state and if so, cancel any future appointments.

## 2021-06-29 ENCOUNTER — Telehealth: Payer: Self-pay

## 2021-06-29 DIAGNOSIS — G35 Multiple sclerosis: Secondary | ICD-10-CM

## 2021-06-29 NOTE — Telephone Encounter (Signed)
The patient sent a request to release records to Latanya Chapman. The release was sent to [facility] for processing.

## 2021-06-29 NOTE — Telephone Encounter (Signed)
This is our 2nd attempt to reach the patient. I called the patient and left a message to call us back. Please see message below when she calls back.

## 2021-06-29 NOTE — Telephone Encounter (Signed)
Called patient per Dr. Christell Constant request to see if the patient was moving out of state. I did speak to her wife [name]) and she confirmed that she is going to be moving out of state.    Patient is asking if she would be abel to get Gatiramer sent to her pharmacy. She stated that her New PCP is refusing since it is a narcotic.

## 2021-06-30 NOTE — Telephone Encounter (Signed)
I called the patient and left a message to call us back. Please ask which pharmacy she needs her prescription sent to since she is out of state

## 2021-07-01 NOTE — Telephone Encounter (Signed)
This is our 3rd attempt to reach the patient. I left a message to [place] back. Please see message below when she calls back. I have also sent the patient an unable to reach message via mychart.

## 2021-07-03 ENCOUNTER — Encounter: Payer: Self-pay | Admitting: Psychiatry

## 2021-07-10 ENCOUNTER — Other Ambulatory Visit: Payer: Self-pay

## 2021-07-10 DIAGNOSIS — G35 Multiple sclerosis: Secondary | ICD-10-CM

## 2021-07-10 NOTE — BH Discharge Summary (Signed)
[facility] Ambulatory Discharge Summary       R PSY DATE ADMITTED 07/10/2021   DATE ADMITTED TO CURRENT [facility] PROGRAM 12/17/2020     R PSY DC DATE 07/10/2021   DISCHARGE DATE 07/10/2021     R PSY PROGRAM 07/10/2021   Program Adult Ambulatory: Clinic     R PSY REFERRAL SOURCE 07/10/2021   REFERRAL SOURCE Other     R PSY PRESENTING PROBLEM 07/10/2021   PRESENTING PROBLEM Depression and adjustment issues       Discharge Diagnosis    Diagnoses   Code Name Primary?    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 Treatment focused on identifying values and developing strategies to work on them as well as problem solving and resource connection. Client missed her last appointment and contacted writer that she and her partner have moved out of state.     Condition at Time of Discharge: (symptoms, functioning, unresolved problems, recommendations for future care): Client was improved as of last visit.    IS FURTHER MENTAL HEALTH SERVICE, INCLUDING PSYCHOPHARMACOTHERAPY, RECOMMENDED AT THIS TIME? Yes  If "No" or "Yes, but patient has dropped out of care," skip to Discharge Instructions.    DOES PATIENT DISAGREE WITH DISCHARGE FROM THIS PROGRAM? No :  If Yes, Medical Director's signature is required.    R PSY DC TO 07/10/2021   DISCHARGED TO community            DISCHARGE INSTRUCTIONS:     We have provided enough medication to last you until your next psychiatric assessment.  Refills or changes in medication must be done by your new Outpatient Provider.  It is VITAL that you keep your scheduled appointments.    Current Outpatient Medications on File Prior to Visit   Medication Sig Dispense Refill    ondansetron (ZOFRAN) 4 mg tablet Take 1 tablet (4 mg total) by  mouth 3 times daily as needed  for Nausea and Vomiting 90 tablet 0    cetirizine (ZYRTEC) 10 mg tablet TAKE 1 TABLET BY MOUTH EVERY DAY 90 tablet 1    mirabegron (MYRBETRIQ) 50 mg 24 hr tablet Take 1 tablet (50 mg total) by mouth daily 90 tablet 3    buPROPion (WELLBUTRIN XL) 300 mg 24 hr tablet Take 1 tablet (300 mg total) by mouth every morning  SWALLOW WHOLE, DO NOT BREAK,CRUSH OR CHEW 30 tablet 1    carbamide peroxide (DEBROX) 6.5 % otic solution Place 5 drops into both ears 2 times daily 15 mL 0    famotidine (PEPCID) 20 mg tablet Take 1 tablet (20 mg total) by mouth 2 times daily 60 tablet 1    fluticasone (FLONASE) 50 MCG/ACT nasal spray USE 1 SPRAY IN EACH NOSTRIL ONE TIME A DAY 16 mL 1    tiZANidine (ZANAFLEX) 4 mg tablet TAKE 1 TABLET BY MOUTH EVERY 8 HOURS AS NEEDED FOR MUSCLE SPASMS 90 tablet 1    triamcinolone (KENALOG) 0.1 % ointment Apply cream/ointment to areas on hands. 30 g 3    venlafaxine (EFFEXOR-XR) 75 mg 24 hr capsule Take 1 capsule (75 mg total) by mouth daily   Swallow whole. Do not crush or chew. 30 capsule 1    venlafaxine (EFFEXOR-XR)  150 mg 24 hr capsule Take 1 capsule (150 mg total) by mouth daily   Swallow whole. Do not crush or chew. 30 capsule 1    glatiramer 40 mg/mL prefilled syringe Inject 1 mL (40 mg total) into the skin three times a week 12 mL 5    amantadine (SYMMETREL) 100 mg tablet Take 2 tablets (200 mg total) by mouth daily  for Tiredness associated with Multiple Sclerosis 60 tablet 5    Non-System Medication Motorized Scooter 1 each 0     No current facility-administered medications on file prior to visit.       Psychopharmacology Provider Comments: NA        Client relocated      GENERAL INSTRUCTIONS:    [place] and Mobile Crisis Team: (24 hours/7 days) [phone], [phone], (Out of [place]) [phone]         The above information has been discussed with me and I have received a copy. I understand that I am advised to follow the  instructions given to me at appropriately care for my condition.    Patient Signature ______________________________________________________    Date: __________________________    Patient Representative Signature, if required:  ____________________________________________________________________    Date: __________________________

## 2021-07-28 NOTE — Telephone Encounter (Signed)
[employer] release the record on 07/21/21. The original release has been placed in scanning.

## 2021-07-30 NOTE — Progress Notes (Signed)
Could we clarify -- is the plan for services already provided, or for ongoing services?  Per chart, looks like patient has moved, and Dr. Christell Constant not listed as PCP. Also some medication discrepancies on list.     Paperwork is still in my bin.

## 2021-07-30 NOTE — Progress Notes (Signed)
[facility] sent a care plan to be reviewed and signed. The forms has been placed in the providers bin to be processed. Please Fax to [phone] when complete.

## 2021-07-31 NOTE — Progress Notes (Signed)
I spoke to the spouse and they moved out of state. Please see  encounter in spouse account.

## 2021-08-03 ENCOUNTER — Encounter: Payer: Self-pay | Admitting: Gastroenterology

## 2021-08-03 NOTE — Progress Notes (Signed)
The form has been faxed and confirmed. The original placed in scanning.

## 2021-09-23 ENCOUNTER — Other Ambulatory Visit: Payer: Self-pay | Admitting: Neurology

## 2021-09-23 DIAGNOSIS — G35 Multiple sclerosis: Secondary | ICD-10-CM

## 2021-09-23 MED ORDER — GLATIRAMER ACETATE 40 MG/ML SC SOSY *I*
40.0000 mg | PREFILLED_SYRINGE | SUBCUTANEOUS | 5 refills | Status: AC
Start: 2021-09-23 — End: ?

## 2021-09-23 NOTE — Telephone Encounter (Signed)
Date of last visit and plan for FUV: 01/22/21 - 6 months  Date of FUV? none scheduled   Date of last MRI and plan for next: 01/14/21  Is MRI ordered? None ordered   DMT (including alternative dosing if applicable): glatiramer     Labs:          Lab results: 08/08/20  1635   Sodium 142   Potassium 3.6   Chloride 103   CO2 27   UN 7   Creatinine 0.70   Glucose 101*   Calcium 9.4                Lab results: 08/08/20  1635 08/06/20  1412   Total Protein 6.6 6.6   Albumin 4.4 4.4   ALT 38* 35   AST 34 26   Alk Phos 75 79   Bilirubin,Total 0.4 0.8   Bilirubin,Direct  --  <0.2

## 2021-10-16 ENCOUNTER — Ambulatory Visit: Payer: Medicare (Managed Care) | Admitting: Primary Care

## 2022-11-04 IMAGING — MG MAMMO BREAST SCREENING TOMOSYNTHESIS 3D BILATERAL
8 of 18 series · 8 of 40 positions shown · non-contrast
Comparison: 10/12/2021.

This is a summary report. The complete report is available in the patient's medical record. If you cannot access the medical record, please contact the sending organization for a detailed fax or copy.
FINAL REPORT:
EXAM: MAMMO BREAST SCREENING TOMOSYNTHESIS 3D BILATERAL
CLINICAL HISTORY: Screening mammogram. No complaints. Please note, patient has lost 50 pounds since her last mammogram. Family history of breast carcinoma in the patient's paternal aunt at age 62.
TECHNIQUE: MLO and CC views with tomosynthesis of both breasts were obtained on a digital full-field mammographic unit. This examination was interpreted in conjunction with computer aided detection system. Nipple and skin markers were placed as needed.

[L XCCL]
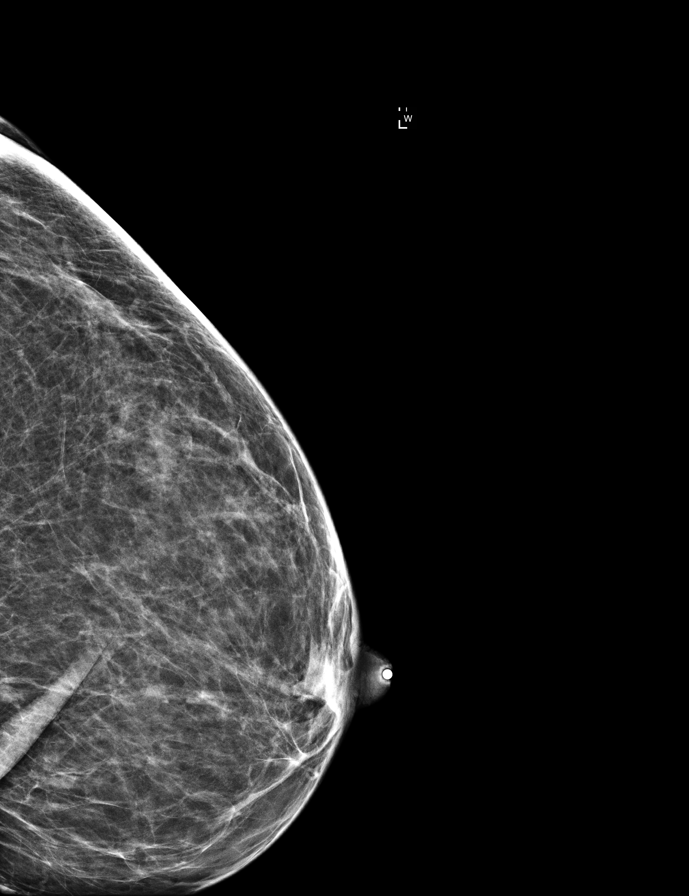

[R MLO]
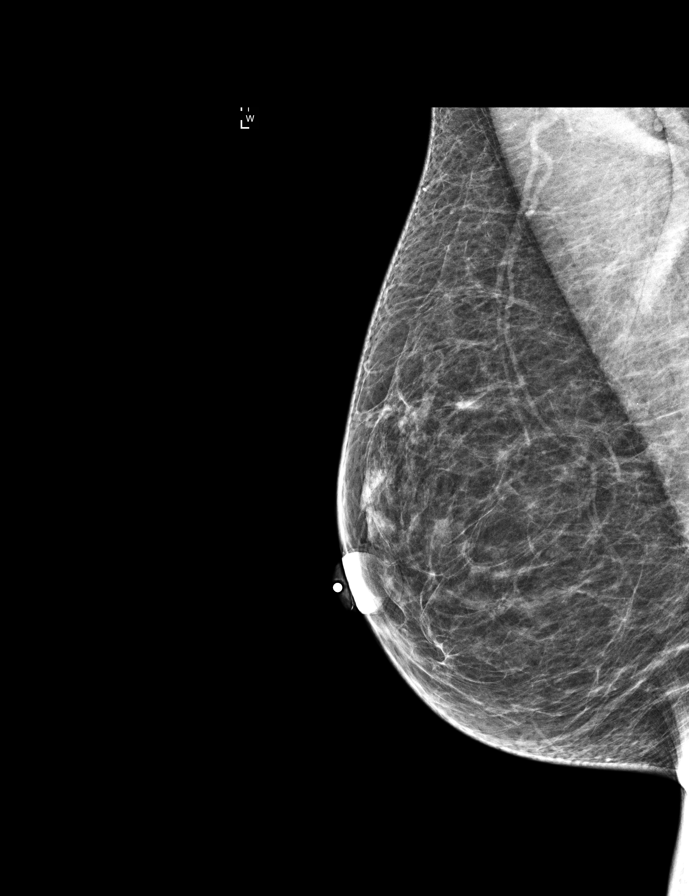

[R CC synth-2D]
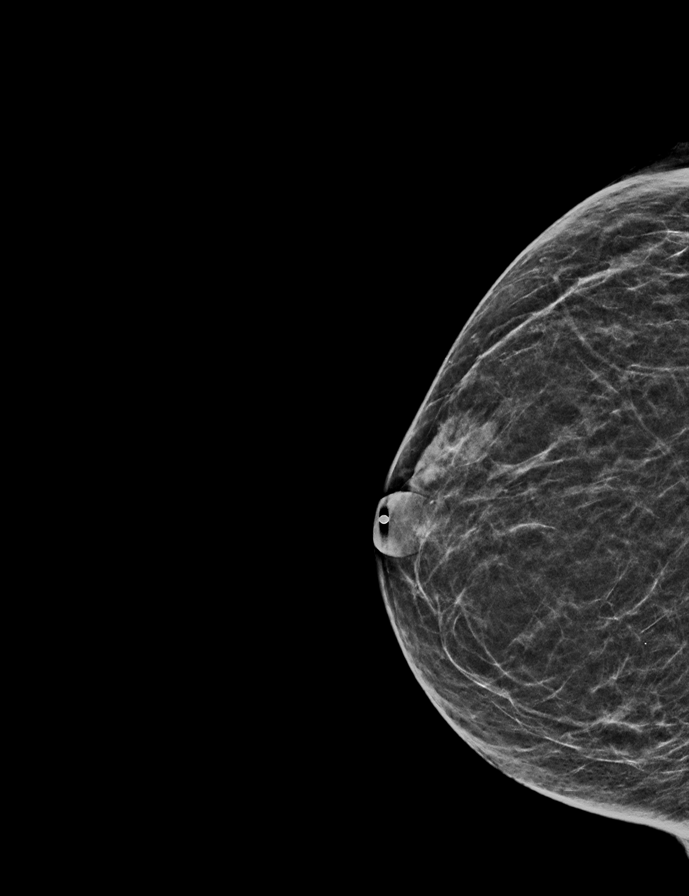

[L CC synth-2D]
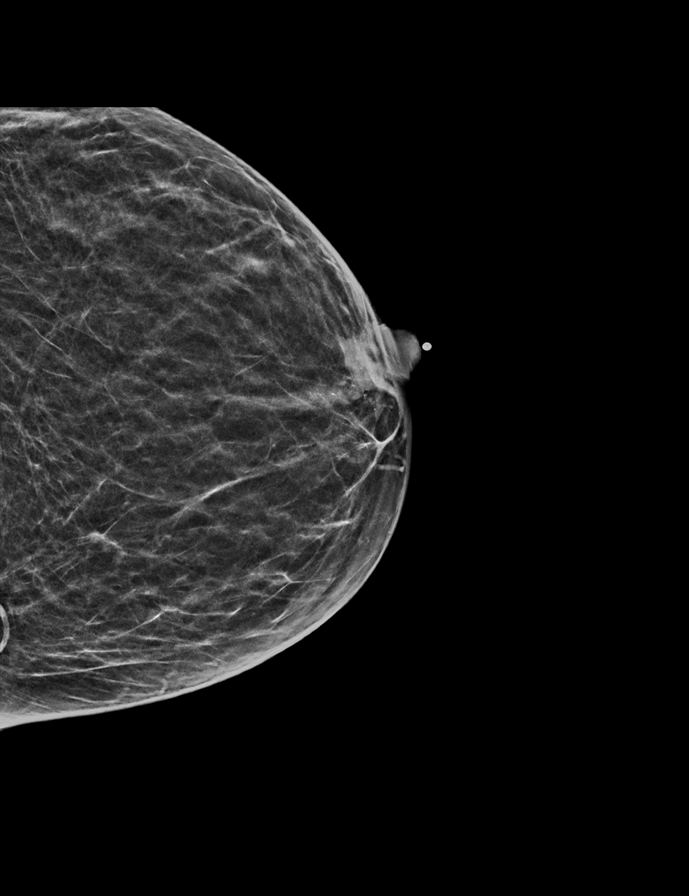

[L MLO]
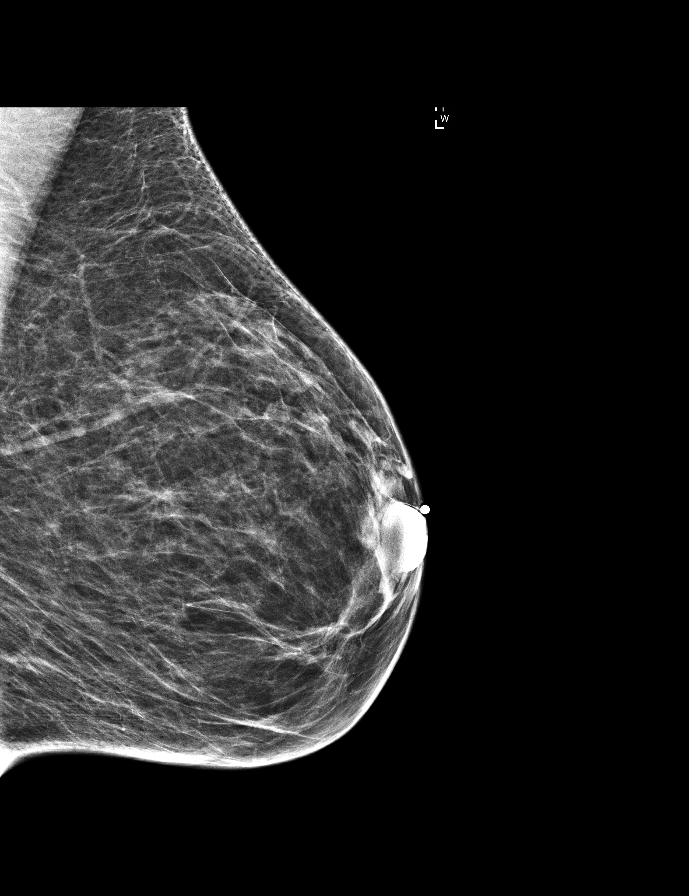

[L MLO synth-2D (1 of 2)]
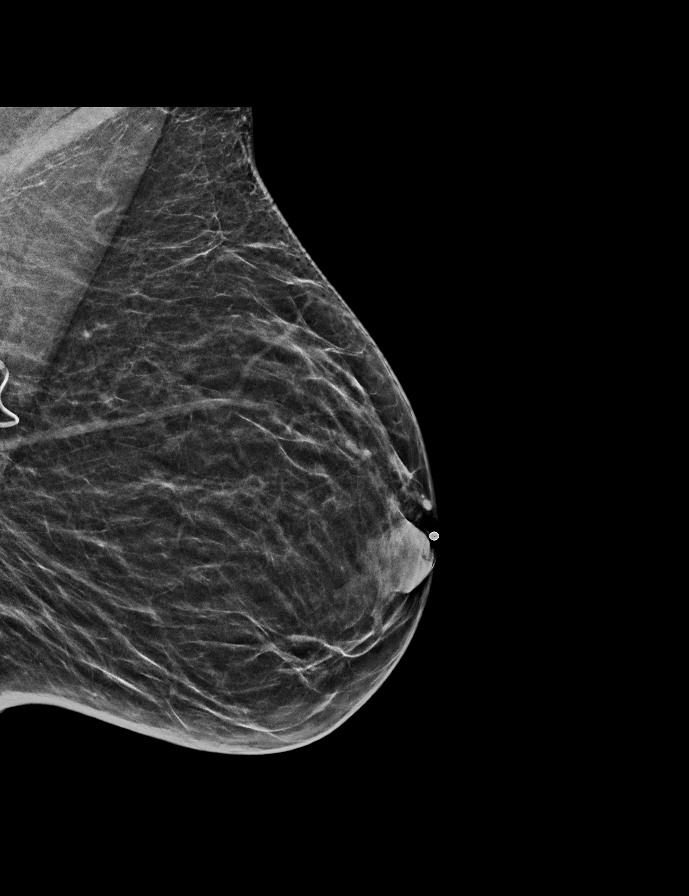

[L MLO synth-2D (2 of 2)]
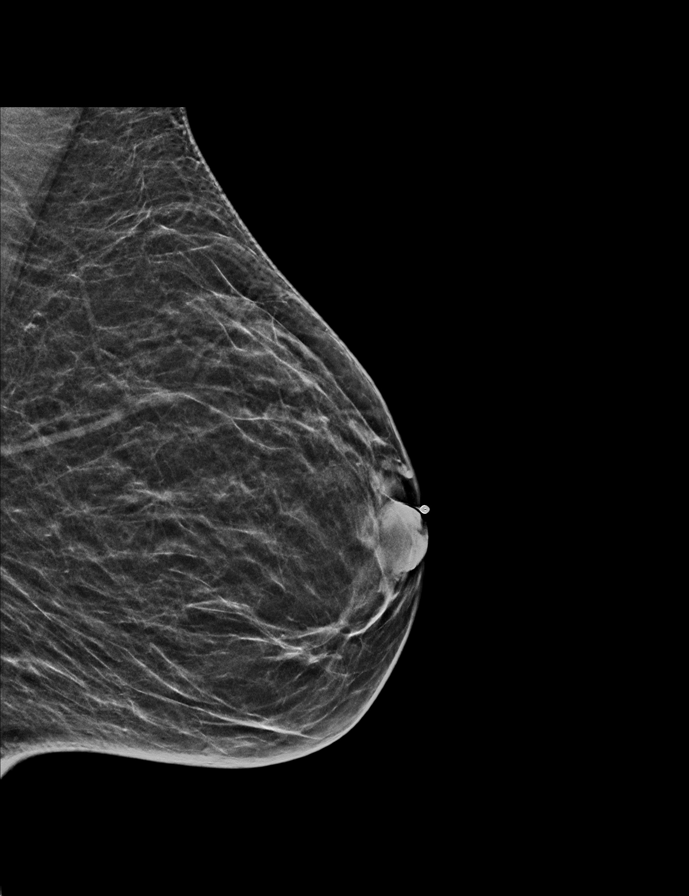

[R MLO synth-2D]
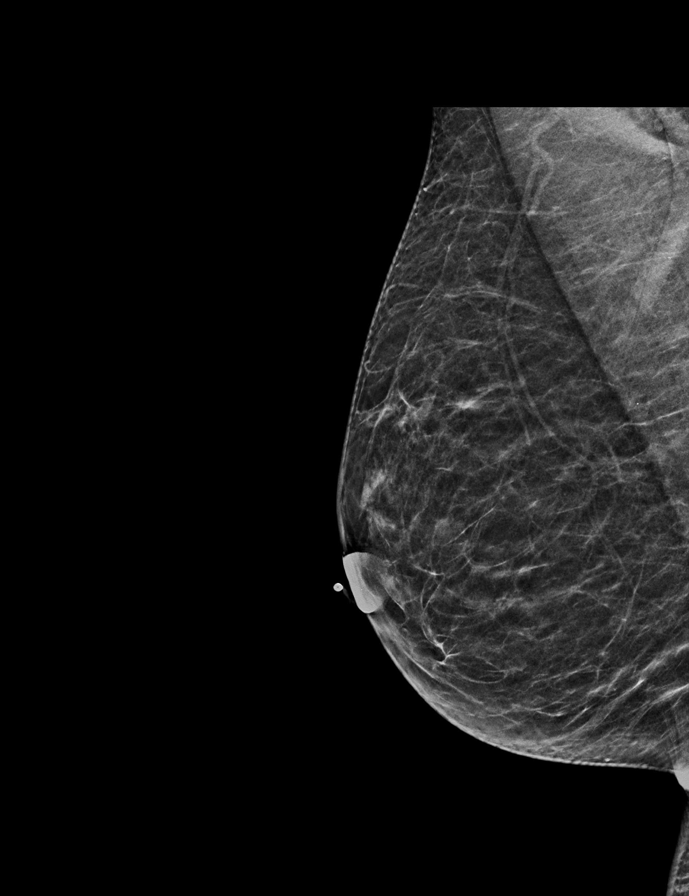

[8 of 40 positions shown; findings below may reference images not displayed]

FINDINGS: BREAST DENSITY TYPE B: There are scattered areas of fibroglandular density.
There is an asymmetry in the medial posterior depth left breast visualized on CC view. No suspicious findings are visualized in the remaining breast. There is no evidence of a dominant mass, suspicious cluster of calcifications, or architectural distortion in the right breast. Mild decreased breast volume consistent with patient's interval weight loss. The skin, nipples, and both axillae are unremarkable.
IMPRESSION: 1. Indeterminate asymmetry in the medial posterior depth left breast visualized on CC view. Recommend a diagnostic left mammogram to include true lateral and spot compression views as well as a possible limited left breast ultrasound if mammographic findings persist.
2. No mammographic evidence of malignancy in the right breast.
BI-RADS Category 0: Incomplete: Need additional imaging evaluation with mammogram and/or ultrasound
Recommendation(s): Diagnostic left mammogram and possible limited left breast ultrasound
The patient information will be entered into a reminder system with a target due date for the next mammogram.

## 2023-01-06 IMAGING — MG MAMMO BREAST DIAGNOSTIC TOMOSYNTHESIS 3D LEFT
6 of 9 series · 6 of 21 positions shown · non-contrast
Comparison: 11/04/2022, 10/12/2021

This is a summary report. The complete report is available in the patient's medical record. If you cannot access the medical record, please contact the sending organization for a detailed fax or copy.
FINAL REPORT:
EXAM: MAMMO BREAST DIAGNOSTIC TOMOSYNTHESIS 3D LEFT, BREAST ULTRASOUND LEFT LIMITED
CLINICAL HISTORY: Callback from screening
TECHNIQUE: CC, MLO and ML views with tomosynthesis  of the left breast were obtained on a digital full-field mammographic unit. This examination was interpreted in conjunction with computer aided detection system. High-resolution real-time targeted left breast ultrasound was also performed in the region of interest.

[L CC synth-2D (1 of 2)]
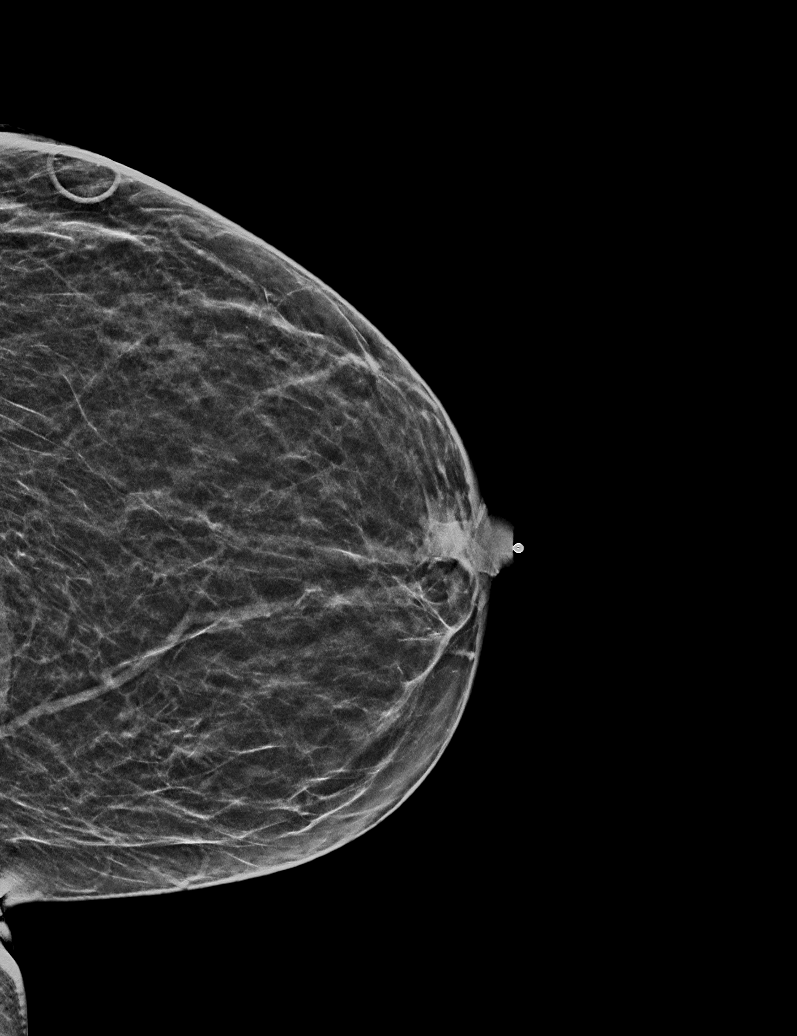

[L ML]
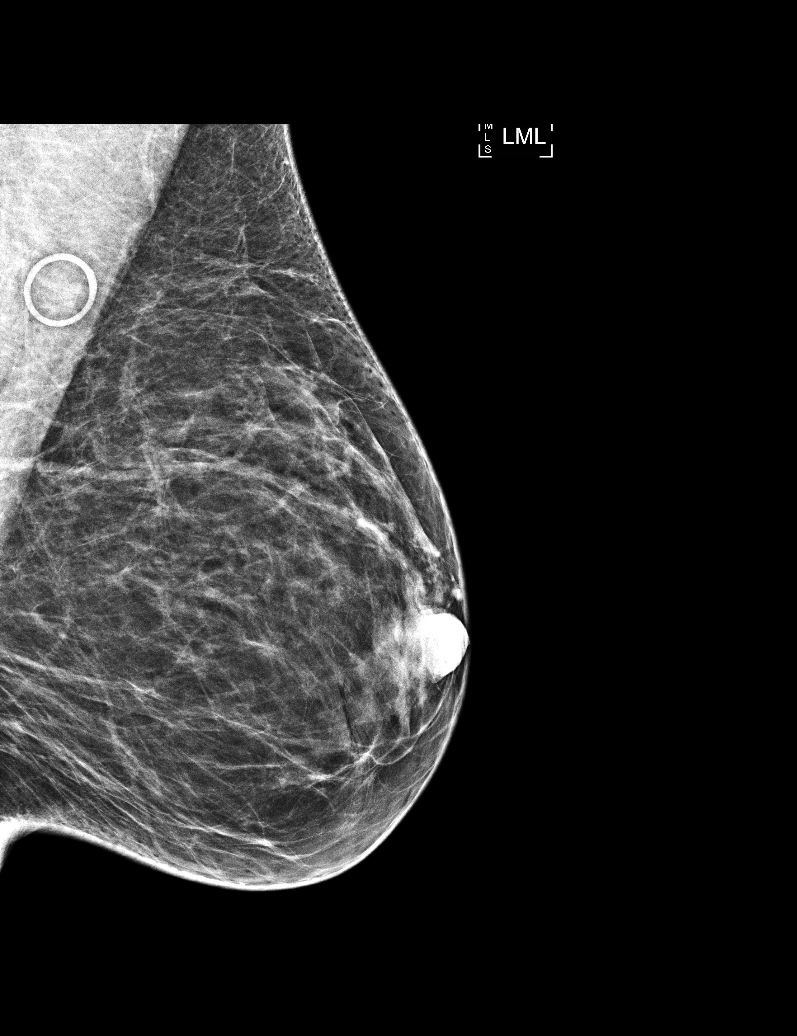

[L CC (1 of 2)]
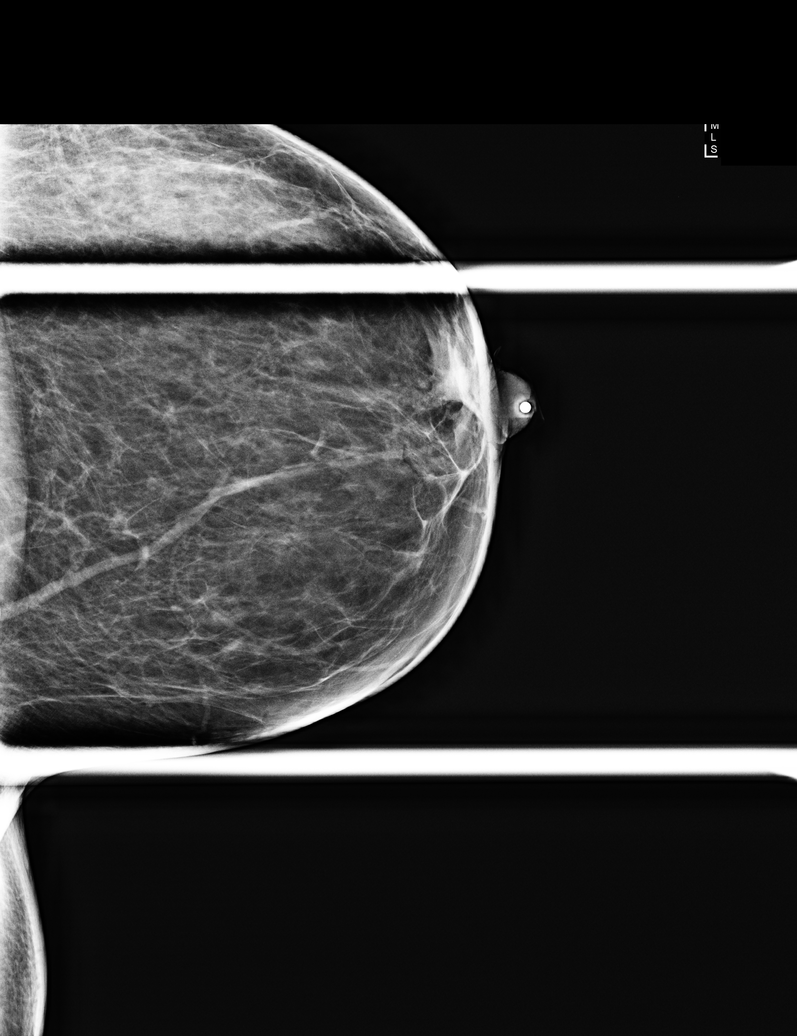

[L CC (2 of 2)]
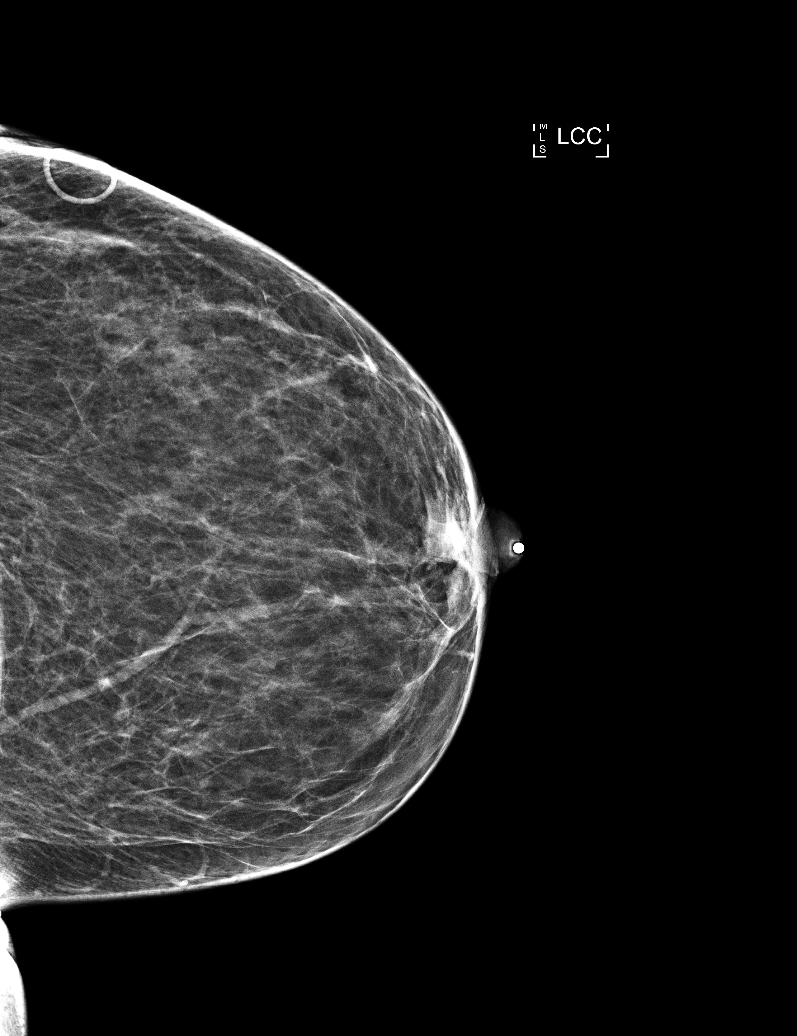

[L ML synth-2D]
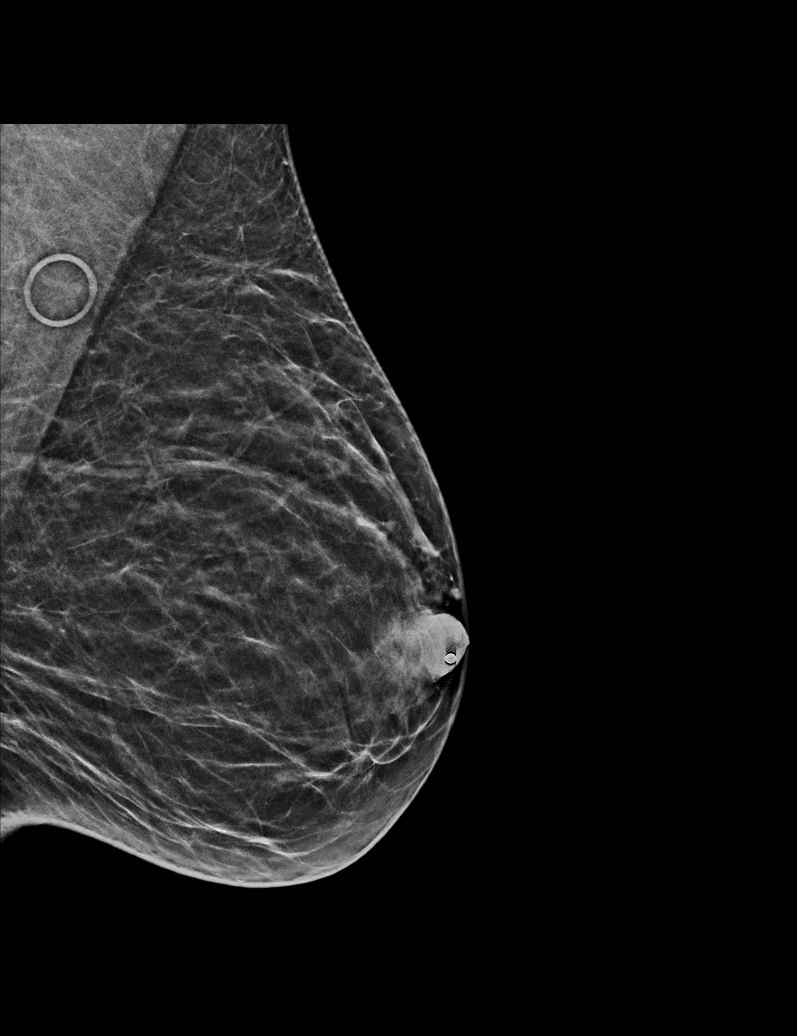

[L CC synth-2D (2 of 2)]
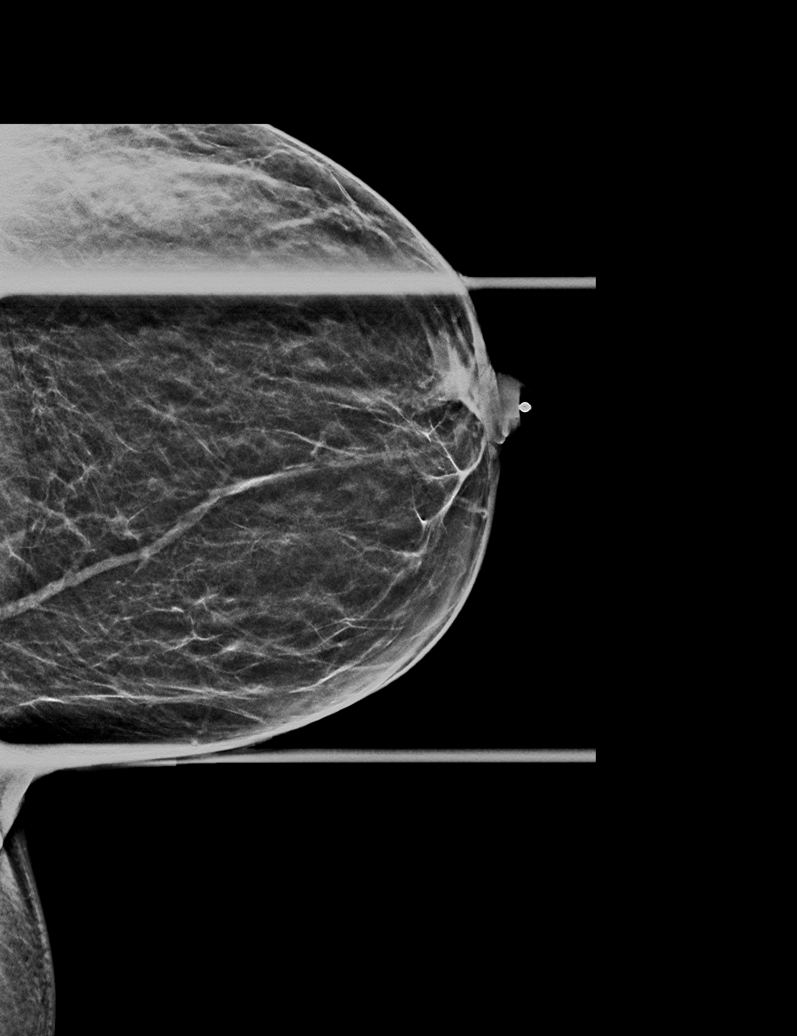

[6 of 21 positions shown; findings below may reference images not displayed]

FINDINGS: LEFT MAMMOGRAM:
Breast density: There are scattered fibroglandular densities (B).
Focal asymmetry in the posterior medial left breast is less conspicuous on spot compression and on the routine left CC view.
LIMITED LEFT BREAST ULTRASOUND:
Targeted ultrasound of the left breast demonstrates no sonographic correlate in the area of concern. Normal-appearing tissues are seen.
IMPRESSION: Probably benign asymmetry in the medial left breast without a correlate on ultrasound. Recommend 6-month follow-up.
BI-RADS Category 3: Probably benign finding
Recommendation(s): Follow-up diagnostic mammogram and possible ultrasound of the left breast in 6 months.
The patient information will be entered into a reminder system with a target due date for the next mammogram.
Is the patient pregnant?
No

## 2023-04-07 IMAGING — MR MRI THORACIC SPINE WITH AND  WITHOUT CONTRAST
6 of 11 series · 29 of 48 positions shown · IV contrast (PROHANCE)
Comparison: None.

FINAL REPORT:
EXAM: MRI BRAIN WITH AND WITHOUT CONTRAST, MRI CERVICAL SPINE WITH AND WITHOUT CONTRAST, MRI THORACIC SPINE WITH AND  WITHOUT CONTRAST
CLINICAL INDICATION:  Multiple sclerosis.
TECHNIQUE: Multiplanar, multisequence imaging of the brain, cervical spine, and thoracic spine without and with contrast was performed using the multiple sclerosis protocol. Intravenous contrast material was administered for the examination.

[Series 1: T2 · sagittal · 4.0mm · 0.78mm/px · 2 of 15 slices shown (1 of 3)]
[im 1/15]
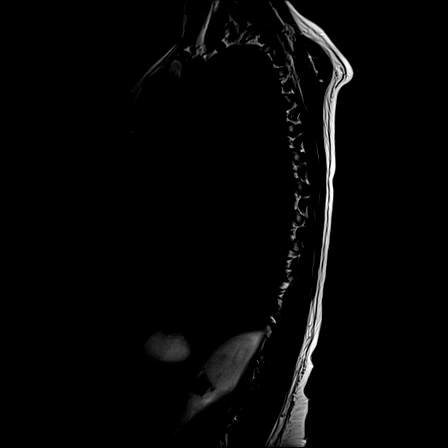
[im 15/15]
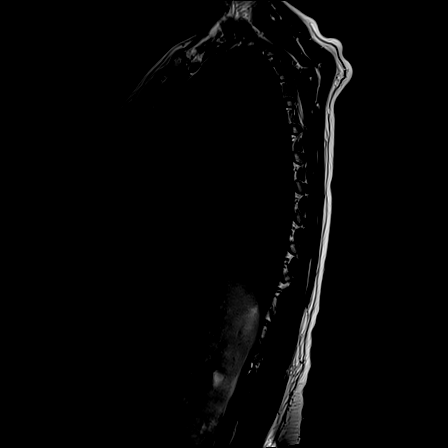

[Series 2: T1 · sagittal · 4.0mm · 0.78mm/px · 2 of 15 slices shown (1 of 3)]
[im 1/15]
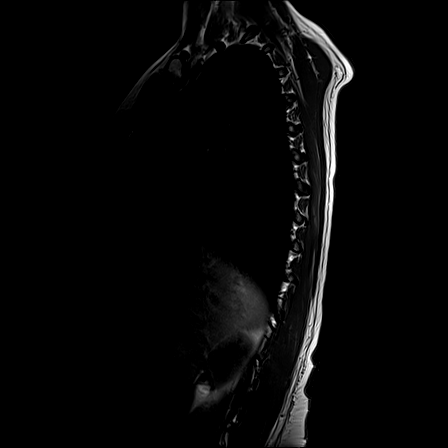
[im 15/15]
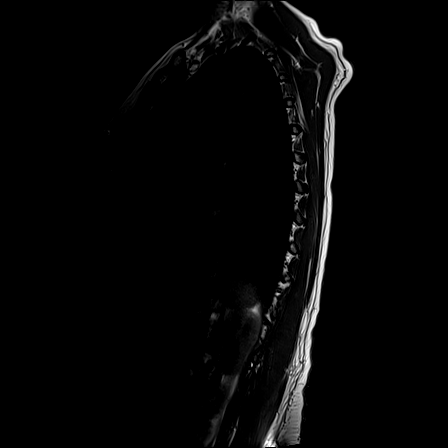

[Series 4: T2 · axial · 4.0mm · 0.78mm/px · z∈[-217,-48]mm · 8 of 40 slices shown (2 of 3)]
[im 1/40]
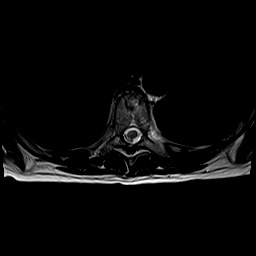
[im 6/40]
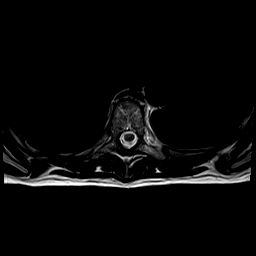
[im 12/40]
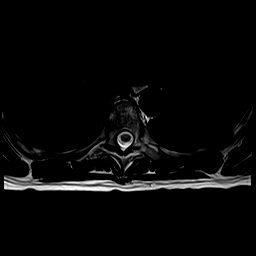
[im 17/40]
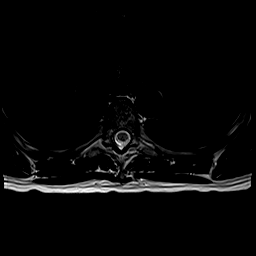
[im 23/40]
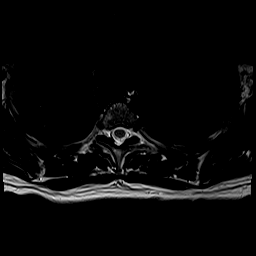
[im 28/40]
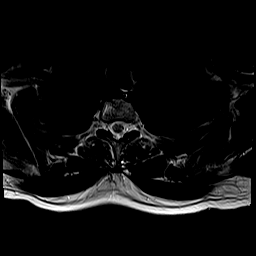
[im 34/40]
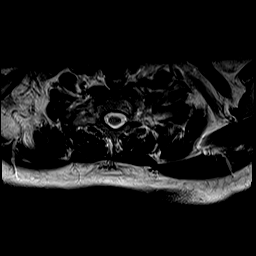
[im 40/40]
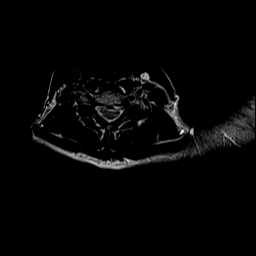

[Series 5: T2 · axial · 4.0mm · 0.78mm/px · z∈[-314,-144]mm · 8 of 40 slices shown (3 of 3)]
[im 1/40]
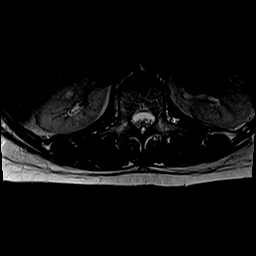
[im 6/40]
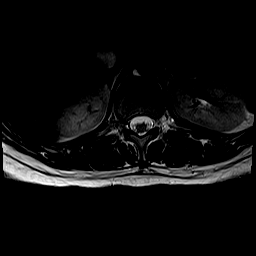
[im 12/40]
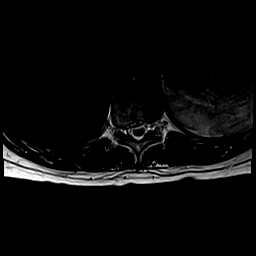
[im 17/40]
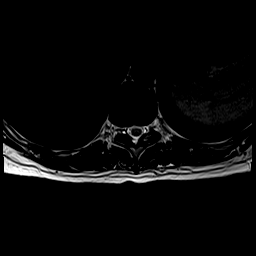
[im 23/40]
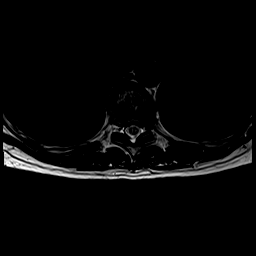
[im 28/40]
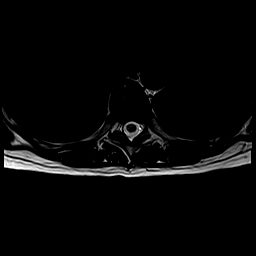
[im 34/40]
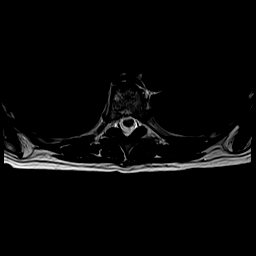
[im 40/40]
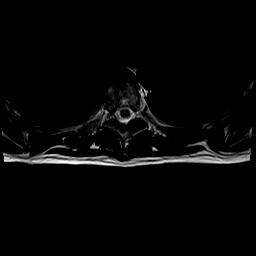

[Series 6: T1 · axial · non-contrast · 7.0mm · 0.39mm/px · z∈[-220,-45]mm · 5 of 25 slices shown (2 of 3)]
[im 1/25]
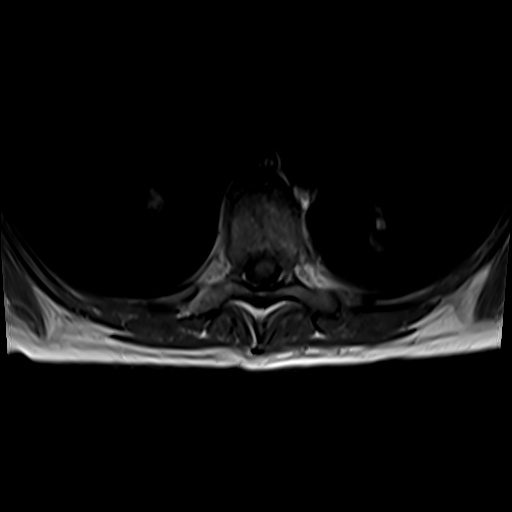
[im 7/25]
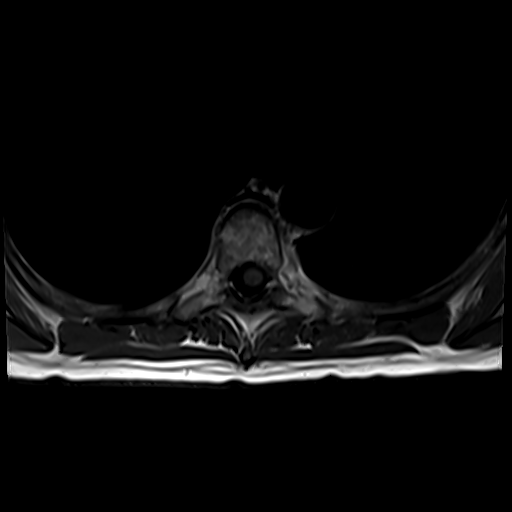
[im 13/25]
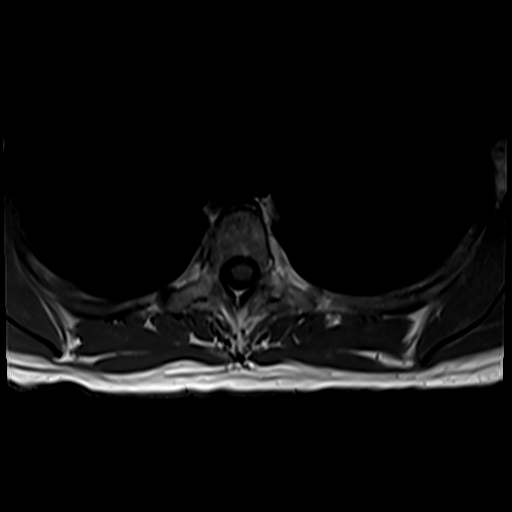
[im 19/25]
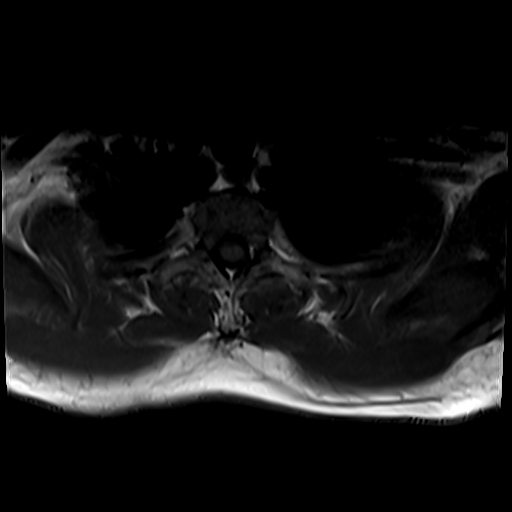
[im 25/25]
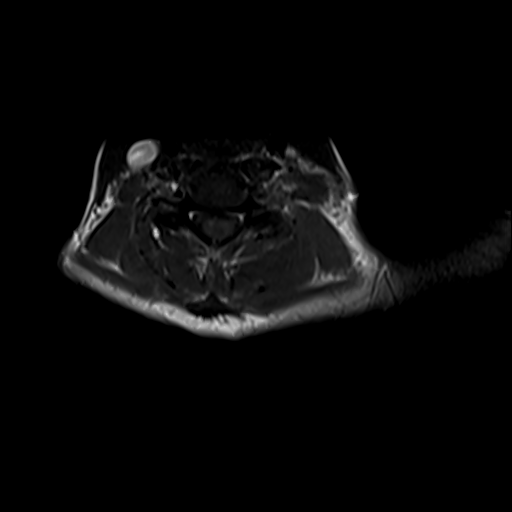

[Series 7: T1 · axial · non-contrast · 7.0mm · 0.39mm/px · z∈[-317,-185]mm · 4 of 25 slices shown (3 of 3)]
[im 1/25]
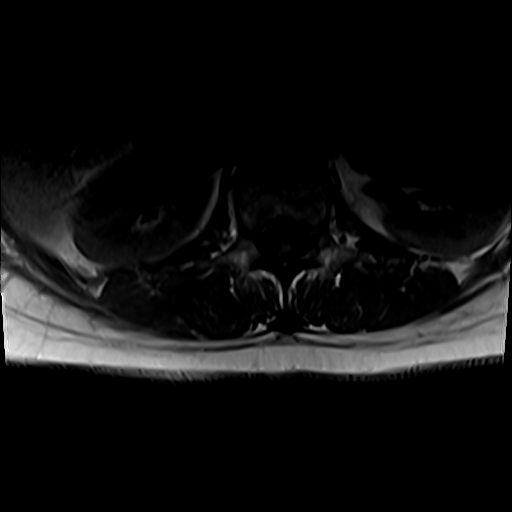
[im 7/25]
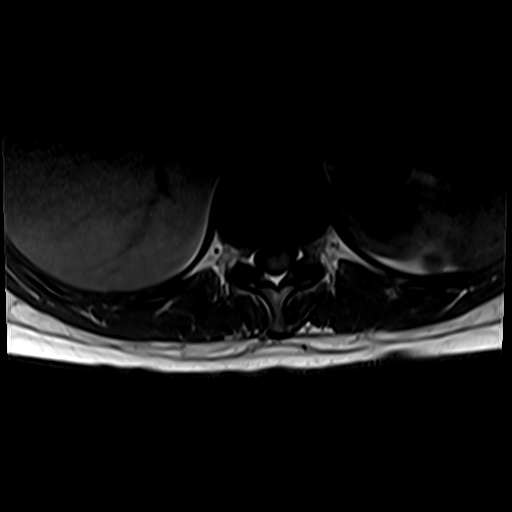
[im 13/25]
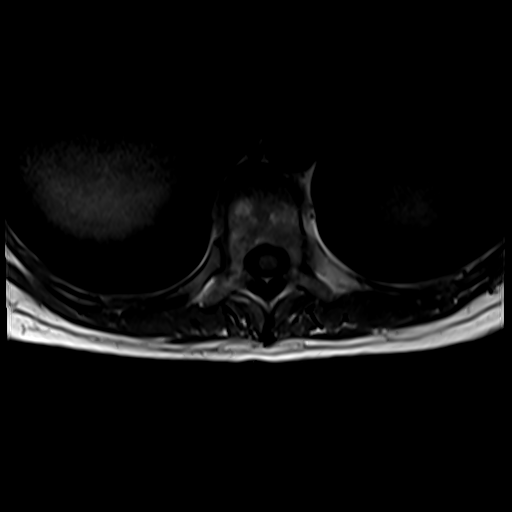
[im 19/25]
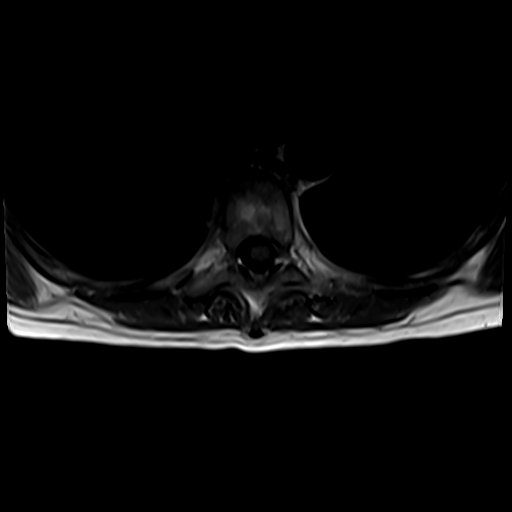

[29 of 48 positions shown; findings below may reference images not displayed]

FINDINGS: MRI BRAIN:
BRAIN PARENCHYMA:
T2 Hyperintense Lesions:
- Periventricular: 3 or more lesions contacting the ependymal surface
- Juxtacortical/Cortical: Present
- Infratentorial: None
- Visualized Upper Cervical Cord: None
- Overall Disease Burden: 10-20 lesions
Enhancing Lesions: None.
T1 Hypointense Lesions: None.
Parenchymal Atrophy: None.
Other: No infarction on DWI. No hemorrhage.
ADDITIONAL FINDINGS:
Extra-axial Collection:  None
Ventricular System: No hydrocephalus.
Major Intracranial Flow Voids: Normal
Osseous Structures:  Expected marrow signal.
Included Orbits: Normal
Paranasal Sinuses:  Predominantly clear
Tympanomastoid Cavities:  Normal
MRI CERVICAL SPINE:
SPINAL CORD:
T2 Hyperintense Lesions: None.
Enhancing Lesions:  None
Spinal Cord Volume: Normal
ADDITIONAL FINDINGS:
Alignment: Normal
Vertebral Bodies: Normal in height
Marrow Signal: Diffuse T1 marrow hypointensity is nonspecific though may be seen in the setting of anemia, smoking, or chronic illness. An underlying marrow replacing process cannot be excluded.
Intervertebral Discs: Multilevel disc dessication with loss of disc space height
Paraspinal Soft Tissues: Normal
Individual Levels: Mild multilevel degenerative changes of the cervical spine. Up to mild canal narrowing at C6-C7 secondary to disc osteophyte complex/posterior central disc protrusion. Multiple levels of uncovertebral hypertrophy and facet arthrosis as follows:
*  Moderate foraminal narrowing on the left at C2-C3
*  Mild bilateral foraminal narrowing at C4-C5
*  Moderate bilateral foraminal narrowing at C5-C6
*  Moderate/severe left and moderate right foraminal narrowing at C6-C7.
MRI THORACIC SPINE:
SPINAL CORD:
T2 Hyperintense Lesions: None.
Enhancing Lesions:  None
Spinal Cord Volume: Normal
ADDITIONAL FINDINGS:
Alignment: Normal
Vertebral Bodies: Normal in height. Scattered T1/T2 hyperintensities most notably in the T10 and T11 vertebral bodies are favored to reflect intraosseous hemangiomas.
Marrow Signal: Diffuse T1 marrow hypointensity is nonspecific though may be seen in the setting of anemia, smoking, or chronic illness. An underlying marrow replacing process cannot be excluded.
Intervertebral Discs: Multilevel disc dessication with loss of disc space height
Paraspinal Soft Tissues: Normal
Individual Levels: Multilevel degenerative changes with up to mild canal narrowing at T11-T12 secondary to posterior central disc protrusion/disc osteophyte complex with minimal contouring of the ventral thoracic cord. No high-grade foraminal narrowing.
IMPRESSION: 1.  Brain lesions in a typical distribution for demyelinating disease.
2.  No evidence of demyelinating disease in the cervical or thoracic spine.
3.  No abnormal enhancement.
4.  Diffuse T1 marrow hypointensity in the cervical and thoracic spine is nonspecific though may be seen in the setting of anemia, smoking, or chronic illness. An underlying marrow replacing process cannot be excluded.
5.  Degenerative changes as above.
Is the patient pregnant?
No

## 2023-04-07 IMAGING — MR MRI CERVICAL SPINE WITH AND WITHOUT CONTRAST
7 of 11 series · 34 of 48 positions shown · IV contrast (agent unspecified)
Comparison: None.

FINAL REPORT:
EXAM: MRI BRAIN WITH AND WITHOUT CONTRAST, MRI CERVICAL SPINE WITH AND WITHOUT CONTRAST, MRI THORACIC SPINE WITH AND  WITHOUT CONTRAST
CLINICAL INDICATION:  Multiple sclerosis.
TECHNIQUE: Multiplanar, multisequence imaging of the brain, cervical spine, and thoracic spine without and with contrast was performed using the multiple sclerosis protocol. Intravenous contrast material was administered for the examination.

[Series 1: T2 · sagittal · 3.0mm · 0.69mm/px · 4 of 15 slices shown (1 of 2)]
[im 1/15]
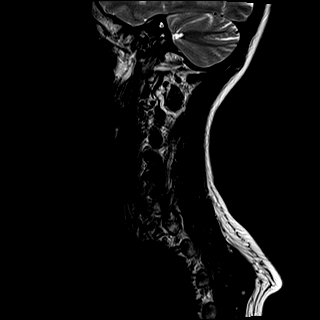
[im 5/15]
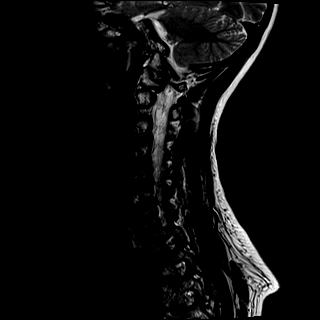
[im 10/15]
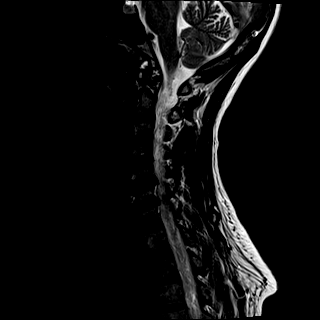
[im 15/15]
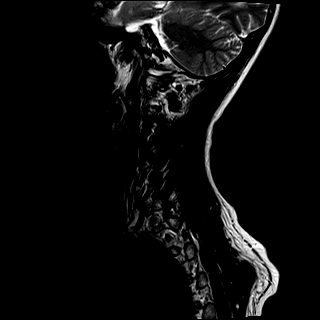

[Series 2: T1 · sagittal · 3.0mm · 0.62mm/px · 3 of 15 slices shown (1 of 3)]
[im 1/15]
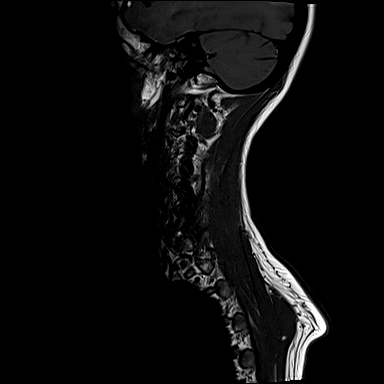
[im 8/15]
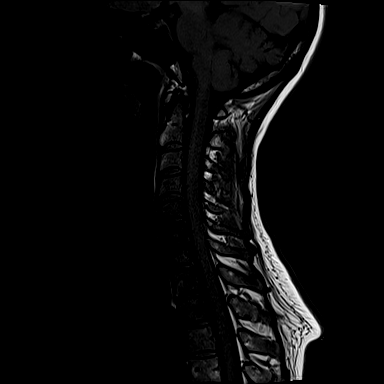
[im 15/15]
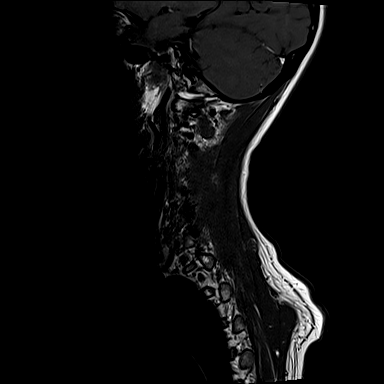

[Series 9: T2 · axial · 3.0mm · 0.70mm/px · z∈[-92,+20]mm · 8 of 40 slices shown (2 of 2)]
[im 1/40]
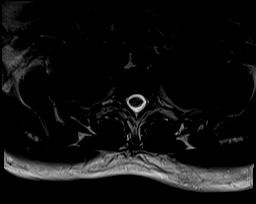
[im 6/40]
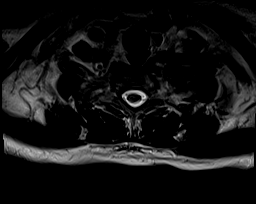
[im 12/40]
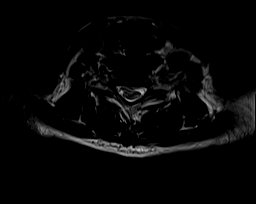
[im 17/40]
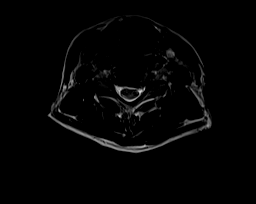
[im 23/40]
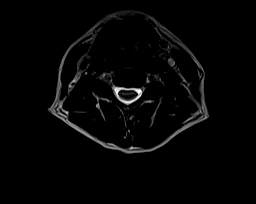
[im 28/40]
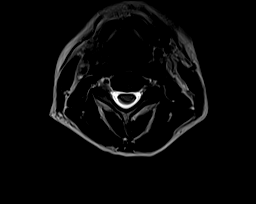
[im 34/40]
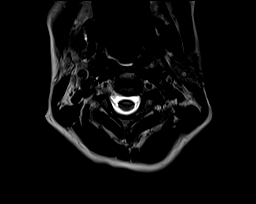
[im 40/40]
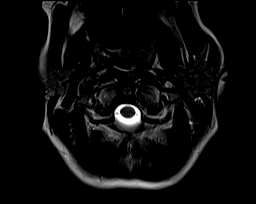

[Series 10: T1 · axial · non-contrast · 3.0mm · 0.47mm/px · z∈[-97,+17]mm · 8 of 37 slices shown (2 of 3)]
[im 1/37]
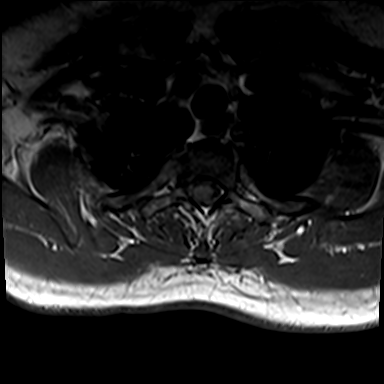
[im 6/37]
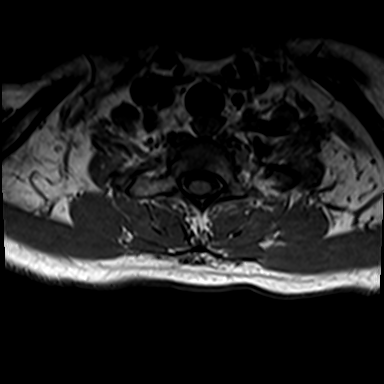
[im 11/37]
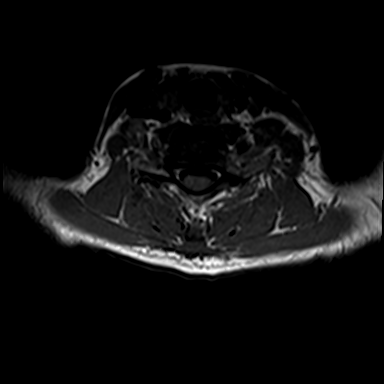
[im 16/37]
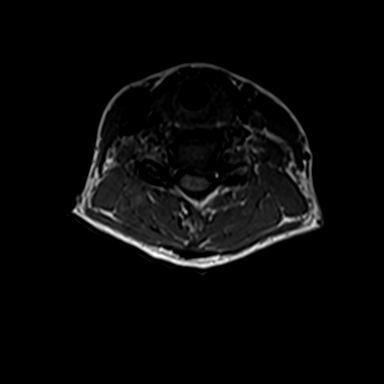
[im 21/37]
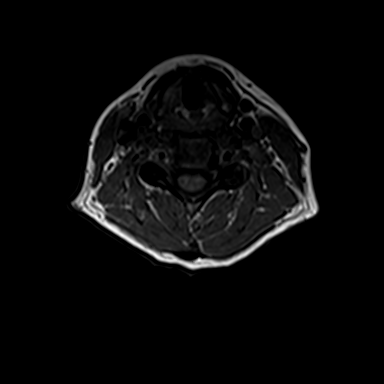
[im 26/37]
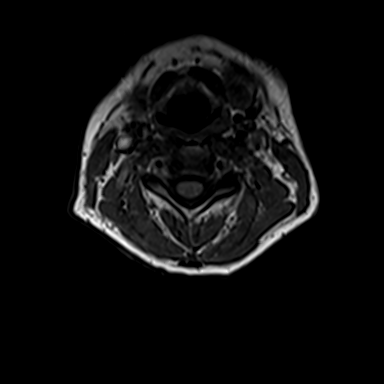
[im 31/37]
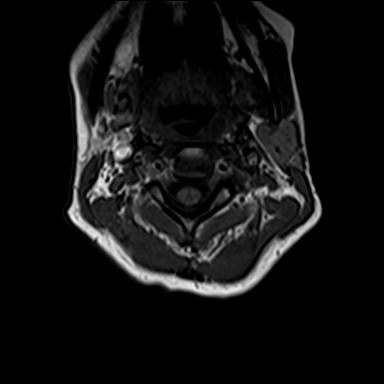
[im 37/37]
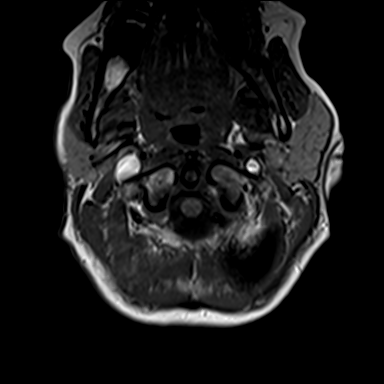

[Series 11: T1 fat-sat post-contrast · sagittal · 3.0mm · 0.43mm/px · 3 of 15 slices shown (1 of 2)]
[im 1/15]
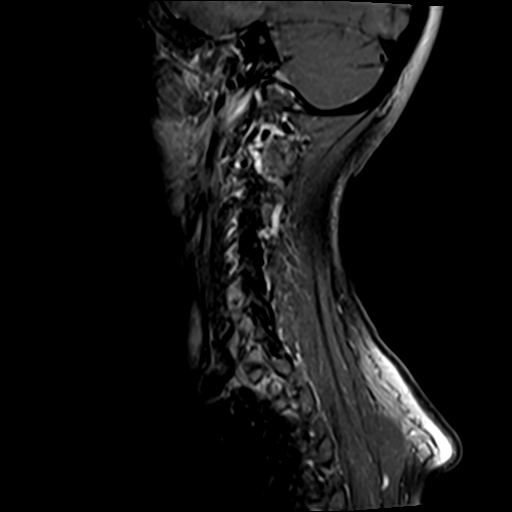
[im 8/15]
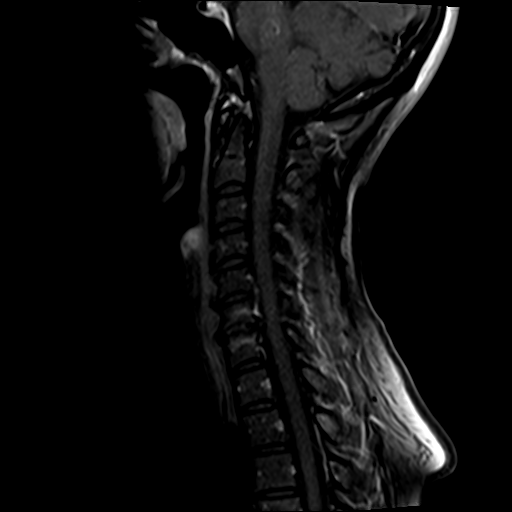
[im 15/15]
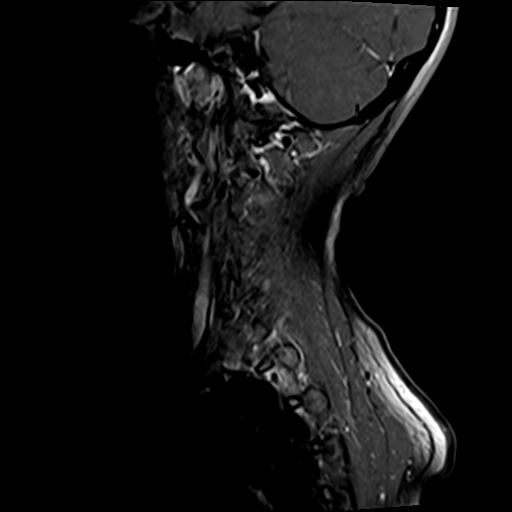

[Series 12: T1 · sagittal · 3.0mm · 0.62mm/px · 3 of 15 slices shown (3 of 3)]
[im 1/15]
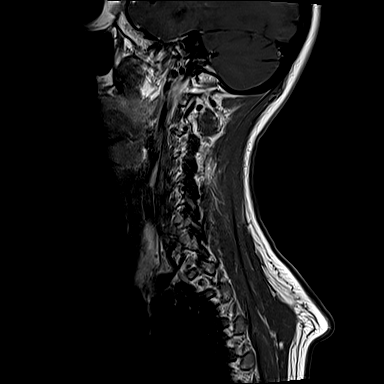
[im 8/15]
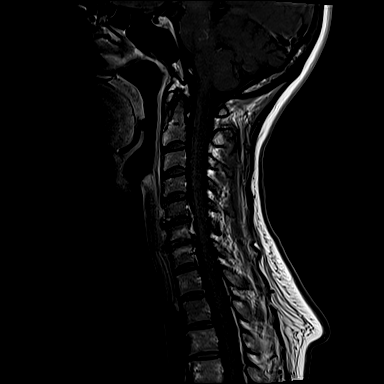
[im 15/15]
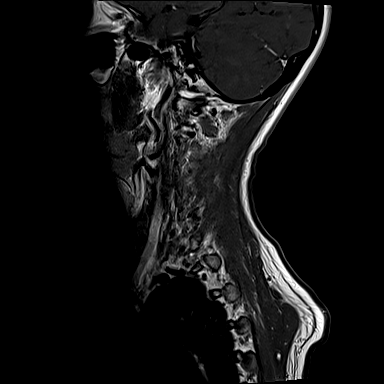

[Series 13: T1 fat-sat post-contrast · axial · 4.0mm · 0.47mm/px · z∈[-103,-5]mm · 5 of 30 slices shown (2 of 2)]
[im 1/30]
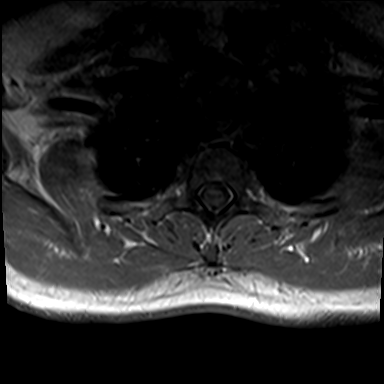
[im 6/30]
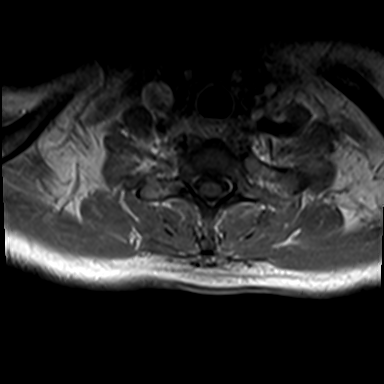
[im 12/30]
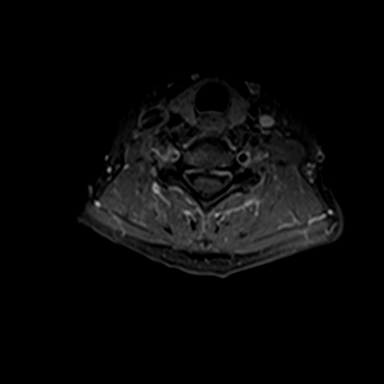
[im 18/30]
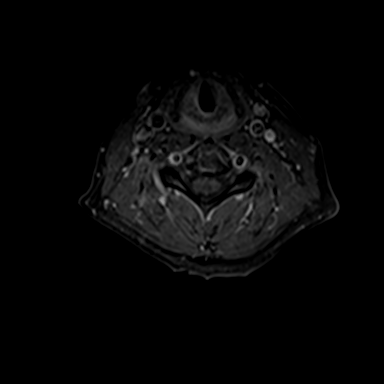
[im 24/30]
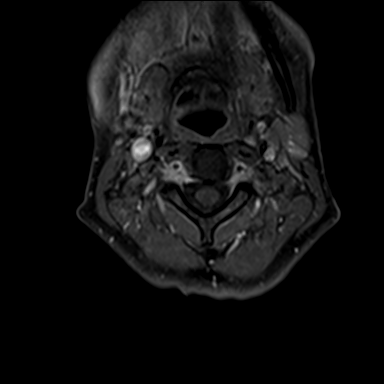

[34 of 48 positions shown; findings below may reference images not displayed]

FINDINGS: MRI BRAIN:
BRAIN PARENCHYMA:
T2 Hyperintense Lesions:
- Periventricular: 3 or more lesions contacting the ependymal surface
- Juxtacortical/Cortical: Present
- Infratentorial: None
- Visualized Upper Cervical Cord: None
- Overall Disease Burden: 10-20 lesions
Enhancing Lesions: None.
T1 Hypointense Lesions: None.
Parenchymal Atrophy: None.
Other: No infarction on DWI. No hemorrhage.
ADDITIONAL FINDINGS:
Extra-axial Collection:  None
Ventricular System: No hydrocephalus.
Major Intracranial Flow Voids: Normal
Osseous Structures:  Expected marrow signal.
Included Orbits: Normal
Paranasal Sinuses:  Predominantly clear
Tympanomastoid Cavities:  Normal
MRI CERVICAL SPINE:
SPINAL CORD:
T2 Hyperintense Lesions: None.
Enhancing Lesions:  None
Spinal Cord Volume: Normal
ADDITIONAL FINDINGS:
Alignment: Normal
Vertebral Bodies: Normal in height
Marrow Signal: Diffuse T1 marrow hypointensity is nonspecific though may be seen in the setting of anemia, smoking, or chronic illness. An underlying marrow replacing process cannot be excluded.
Intervertebral Discs: Multilevel disc dessication with loss of disc space height
Paraspinal Soft Tissues: Normal
Individual Levels: Mild multilevel degenerative changes of the cervical spine. Up to mild canal narrowing at C6-C7 secondary to disc osteophyte complex/posterior central disc protrusion. Multiple levels of uncovertebral hypertrophy and facet arthrosis as follows:
*  Moderate foraminal narrowing on the left at C2-C3
*  Mild bilateral foraminal narrowing at C4-C5
*  Moderate bilateral foraminal narrowing at C5-C6
*  Moderate/severe left and moderate right foraminal narrowing at C6-C7.
MRI THORACIC SPINE:
SPINAL CORD:
T2 Hyperintense Lesions: None.
Enhancing Lesions:  None
Spinal Cord Volume: Normal
ADDITIONAL FINDINGS:
Alignment: Normal
Vertebral Bodies: Normal in height. Scattered T1/T2 hyperintensities most notably in the T10 and T11 vertebral bodies are favored to reflect intraosseous hemangiomas.
Marrow Signal: Diffuse T1 marrow hypointensity is nonspecific though may be seen in the setting of anemia, smoking, or chronic illness. An underlying marrow replacing process cannot be excluded.
Intervertebral Discs: Multilevel disc dessication with loss of disc space height
Paraspinal Soft Tissues: Normal
Individual Levels: Multilevel degenerative changes with up to mild canal narrowing at T11-T12 secondary to posterior central disc protrusion/disc osteophyte complex with minimal contouring of the ventral thoracic cord. No high-grade foraminal narrowing.
IMPRESSION: 1.  Brain lesions in a typical distribution for demyelinating disease.
2.  No evidence of demyelinating disease in the cervical or thoracic spine.
3.  No abnormal enhancement.
4.  Diffuse T1 marrow hypointensity in the cervical and thoracic spine is nonspecific though may be seen in the setting of anemia, smoking, or chronic illness. An underlying marrow replacing process cannot be excluded.
5.  Degenerative changes as above.
Is the patient pregnant?
No

## 2023-04-07 IMAGING — MR MRI BRAIN WITH AND WITHOUT CONTRAST
19 of 22 series · 33 of 48 positions shown · IV contrast (agent unspecified)
Comparison: None.

FINAL REPORT:
EXAM: MRI BRAIN WITH AND WITHOUT CONTRAST, MRI CERVICAL SPINE WITH AND WITHOUT CONTRAST, MRI THORACIC SPINE WITH AND  WITHOUT CONTRAST
CLINICAL INDICATION:  Multiple sclerosis.
TECHNIQUE: Multiplanar, multisequence imaging of the brain, cervical spine, and thoracic spine without and with contrast was performed using the multiple sclerosis protocol. Intravenous contrast material was administered for the examination.

[Series 2: survey br_mpr_sag · sagittal · 1.6mm · 1.60mm/px · 1 of 21 slices shown (1 of 3)]
[im 1/21]
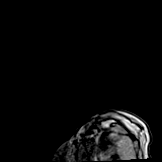

[Series 3: survey br_mpr_cor · coronal · 1.6mm · 1.60mm/px · 1 of 23 slices shown (1 of 3)]
[im 1/23]
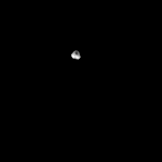

[Series 4: survey br_mpr_tra · axial · 1.6mm · 1.60mm/px · 1 of 25 slices shown (1 of 2)]
[im 1/25]
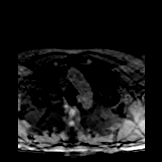

[Series 5: T1 · sagittal · 5.0mm · 0.72mm/px · 1 of 25 slices shown (1 of 2)]
[im 1/25]
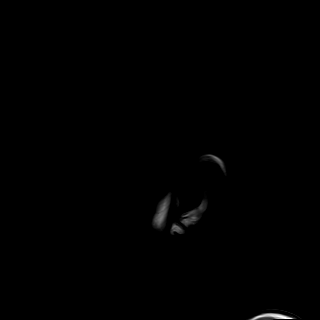

[Series 7: survey br_mpr_sag · sagittal · 1.6mm · 1.60mm/px · 1 of 5 slices shown (2 of 3)]
[im 1/5]
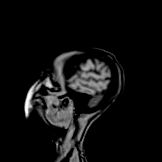

[Series 8: survey br_mpr_cor · coronal · 1.6mm · 1.60mm/px · 1 of 3 slices shown (2 of 3)]
[im 1/3]
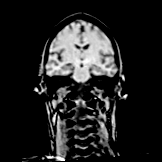

[Series 9: survey br_mpr_tra · axial · 1.6mm · 1.60mm/px · 1 of 3 slices shown (2 of 2)]
[im 1/3]
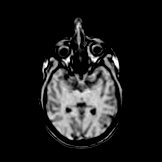

[Series 10: DWI · axial · 5.0mm · 0.60mm/px · 1 of 27 slices shown (1 of 3)]
[im 1/27]
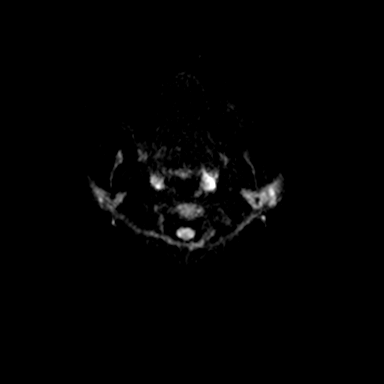

[Series 11: DWI · axial · 5.0mm · 0.60mm/px · 1 of 27 slices shown (2 of 3)]
[im 1/27]
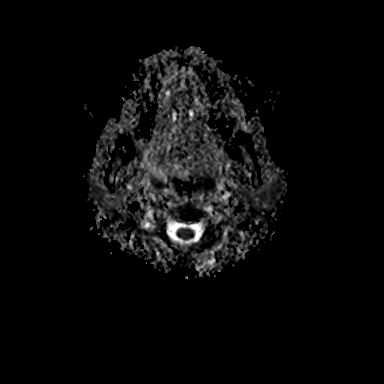

[Series 12: DWI · axial · 5.0mm · 0.60mm/px · 1 of 27 slices shown (3 of 3)]
[im 1/27]
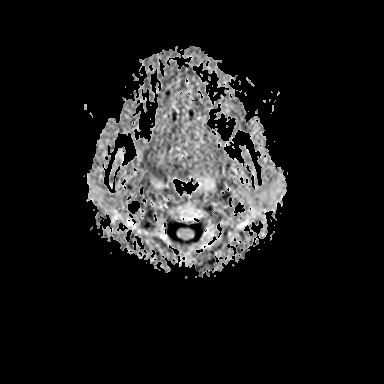

[Series 18: survey br_mpr_sag · sagittal · 1.6mm · 1.60mm/px · 1 of 8 slices shown (3 of 3)]
[im 1/8]
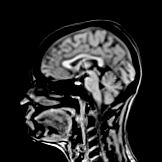

[Series 19: survey br_mpr_cor · coronal · 1.6mm · 1.60mm/px · 1 of 8 slices shown (3 of 3)]
[im 1/8]
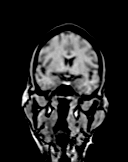

[Series 24: T2 fat-sat · axial · 5.0mm · 0.51mm/px · 1 of 27 slices shown]
[im 1/27]
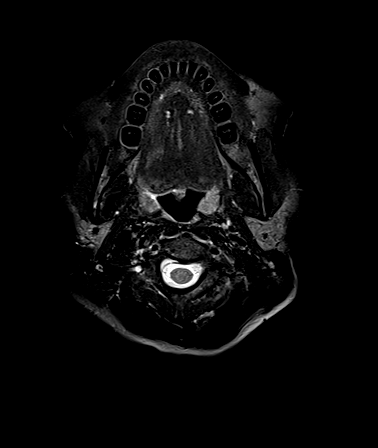

[Series 25: GRE · axial · 5.0mm · 0.45mm/px · 1 of 27 slices shown]
[im 1/27]
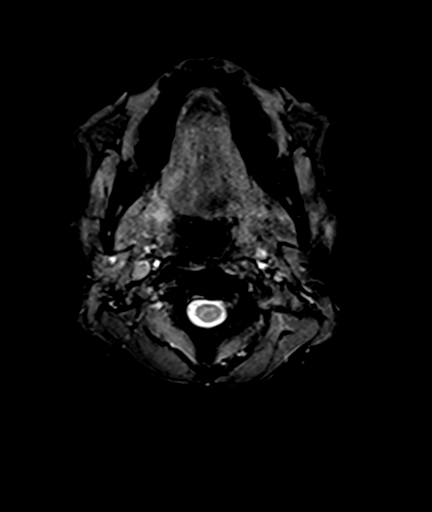

[Series 27: T1 · axial · 5.0mm · 0.72mm/px · 1 of 27 slices shown (2 of 2)]
[im 1/27]
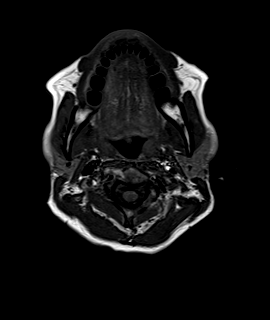

[Series 28: T1 post-contrast · axial · 5.0mm · 0.72mm/px · 1 of 27 slices shown (1 of 4)]
[im 1/27]
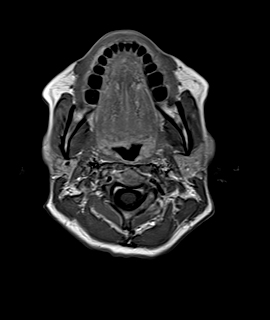

[Series 29: T1 post-contrast · sagittal · 1.0mm · 0.98mm/px · 5 of 160 slices shown (2 of 4)]
[im 1/160]
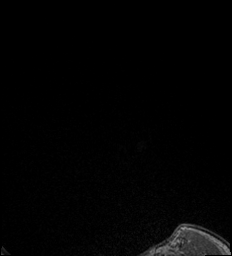
[im 40/160]
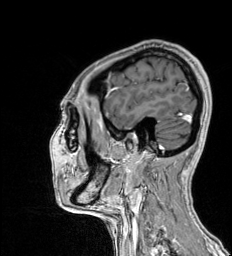
[im 80/160]
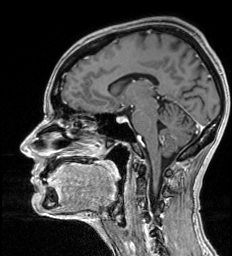
[im 120/160]
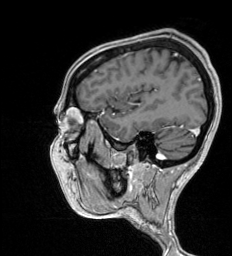
[im 160/160]
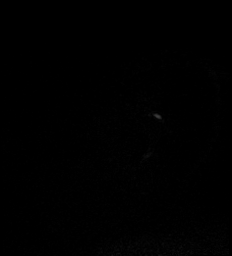

[Series 30: T1 post-contrast · coronal · 1.0mm · 0.98mm/px · 6 of 200 slices shown (3 of 4)]
[im 1/200]
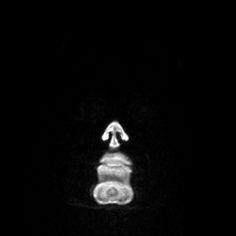
[im 40/200]
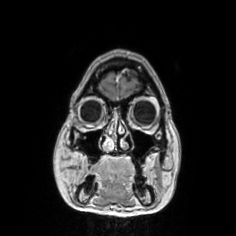
[im 80/200]
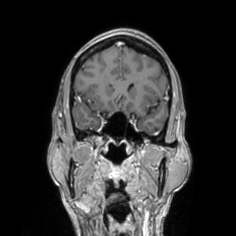
[im 120/200]
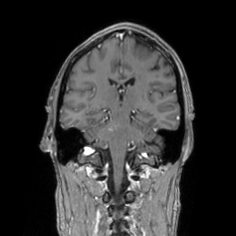
[im 160/200]
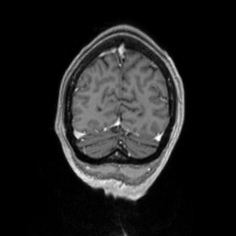
[im 200/200]
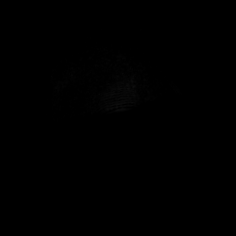

[Series 31: T1 post-contrast · axial · 1.0mm · 0.98mm/px · z∈[+35,+201]mm · 6 of 170 slices shown (4 of 4)]
[im 1/170]
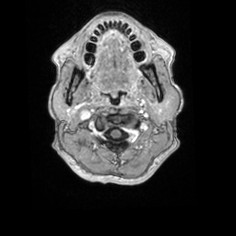
[im 34/170]
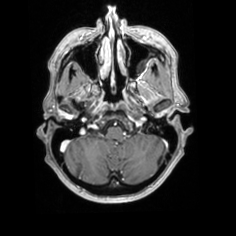
[im 68/170]
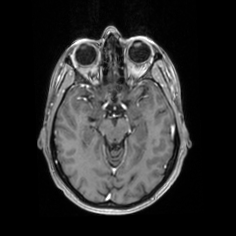
[im 102/170]
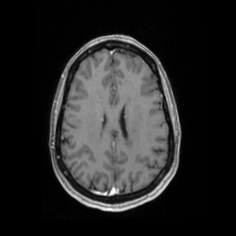
[im 136/170]
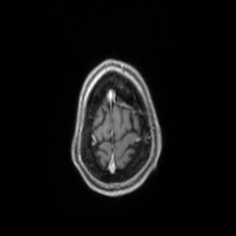
[im 170/170]
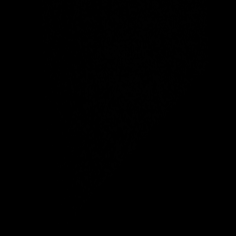

[33 of 48 positions shown; findings below may reference images not displayed]

FINDINGS: MRI BRAIN:
BRAIN PARENCHYMA:
T2 Hyperintense Lesions:
- Periventricular: 3 or more lesions contacting the ependymal surface
- Juxtacortical/Cortical: Present
- Infratentorial: None
- Visualized Upper Cervical Cord: None
- Overall Disease Burden: 10-20 lesions
Enhancing Lesions: None.
T1 Hypointense Lesions: None.
Parenchymal Atrophy: None.
Other: No infarction on DWI. No hemorrhage.
ADDITIONAL FINDINGS:
Extra-axial Collection:  None
Ventricular System: No hydrocephalus.
Major Intracranial Flow Voids: Normal
Osseous Structures:  Expected marrow signal.
Included Orbits: Normal
Paranasal Sinuses:  Predominantly clear
Tympanomastoid Cavities:  Normal
MRI CERVICAL SPINE:
SPINAL CORD:
T2 Hyperintense Lesions: None.
Enhancing Lesions:  None
Spinal Cord Volume: Normal
ADDITIONAL FINDINGS:
Alignment: Normal
Vertebral Bodies: Normal in height
Marrow Signal: Diffuse T1 marrow hypointensity is nonspecific though may be seen in the setting of anemia, smoking, or chronic illness. An underlying marrow replacing process cannot be excluded.
Intervertebral Discs: Multilevel disc dessication with loss of disc space height
Paraspinal Soft Tissues: Normal
Individual Levels: Mild multilevel degenerative changes of the cervical spine. Up to mild canal narrowing at C6-C7 secondary to disc osteophyte complex/posterior central disc protrusion. Multiple levels of uncovertebral hypertrophy and facet arthrosis as follows:
*  Moderate foraminal narrowing on the left at C2-C3
*  Mild bilateral foraminal narrowing at C4-C5
*  Moderate bilateral foraminal narrowing at C5-C6
*  Moderate/severe left and moderate right foraminal narrowing at C6-C7.
MRI THORACIC SPINE:
SPINAL CORD:
T2 Hyperintense Lesions: None.
Enhancing Lesions:  None
Spinal Cord Volume: Normal
ADDITIONAL FINDINGS:
Alignment: Normal
Vertebral Bodies: Normal in height. Scattered T1/T2 hyperintensities most notably in the T10 and T11 vertebral bodies are favored to reflect intraosseous hemangiomas.
Marrow Signal: Diffuse T1 marrow hypointensity is nonspecific though may be seen in the setting of anemia, smoking, or chronic illness. An underlying marrow replacing process cannot be excluded.
Intervertebral Discs: Multilevel disc dessication with loss of disc space height
Paraspinal Soft Tissues: Normal
Individual Levels: Multilevel degenerative changes with up to mild canal narrowing at T11-T12 secondary to posterior central disc protrusion/disc osteophyte complex with minimal contouring of the ventral thoracic cord. No high-grade foraminal narrowing.
IMPRESSION: 1.  Brain lesions in a typical distribution for demyelinating disease.
2.  No evidence of demyelinating disease in the cervical or thoracic spine.
3.  No abnormal enhancement.
4.  Diffuse T1 marrow hypointensity in the cervical and thoracic spine is nonspecific though may be seen in the setting of anemia, smoking, or chronic illness. An underlying marrow replacing process cannot be excluded.
5.  Degenerative changes as above.
Is the patient pregnant?
No

## 2024-03-16 ENCOUNTER — Encounter: Payer: Self-pay | Admitting: Internal Medicine
